# Patient Record
Sex: Male | Born: 1965 | Race: Black or African American | Hispanic: No | Marital: Married | Smoking: Former smoker
Health system: Southern US, Community
[De-identification: ages and names within clinical notes are randomized; demographics above are authoritative.]

## PROBLEM LIST (undated history)

## (undated) DIAGNOSIS — I509 Heart failure, unspecified: Secondary | ICD-10-CM

## (undated) DIAGNOSIS — K219 Gastro-esophageal reflux disease without esophagitis: Secondary | ICD-10-CM

## (undated) DIAGNOSIS — E039 Hypothyroidism, unspecified: Secondary | ICD-10-CM

## (undated) DIAGNOSIS — I5020 Unspecified systolic (congestive) heart failure: Secondary | ICD-10-CM

## (undated) DIAGNOSIS — M109 Gout, unspecified: Secondary | ICD-10-CM

## (undated) DIAGNOSIS — C801 Malignant (primary) neoplasm, unspecified: Secondary | ICD-10-CM

## (undated) DIAGNOSIS — G902 Horner's syndrome: Secondary | ICD-10-CM

## (undated) DIAGNOSIS — D563 Thalassemia minor: Secondary | ICD-10-CM

## (undated) HISTORY — DX: Unspecified systolic (congestive) heart failure: I50.20

## (undated) HISTORY — PX: EYE SURGERY: SHX253

## (undated) HISTORY — PX: THYROIDECTOMY: SHX17

## (undated) HISTORY — DX: Gout, unspecified: M10.9

## (undated) HISTORY — DX: Thalassemia minor: D56.3

---

## 2021-08-10 ENCOUNTER — Encounter (HOSPITAL_BASED_OUTPATIENT_CLINIC_OR_DEPARTMENT_OTHER): Payer: Self-pay

## 2021-08-10 DIAGNOSIS — G4733 Obstructive sleep apnea (adult) (pediatric): Secondary | ICD-10-CM

## 2021-08-30 ENCOUNTER — Ambulatory Visit: Payer: No Typology Code available for payment source | Attending: Neurology | Admitting: Neurology

## 2021-08-30 ENCOUNTER — Other Ambulatory Visit: Payer: Self-pay

## 2021-08-30 DIAGNOSIS — G4733 Obstructive sleep apnea (adult) (pediatric): Secondary | ICD-10-CM | POA: Insufficient documentation

## 2021-08-30 DIAGNOSIS — G4730 Sleep apnea, unspecified: Secondary | ICD-10-CM | POA: Diagnosis present

## 2021-09-11 NOTE — Procedures (Signed)
  Kinder A. Merlene Laughter, MD     [email or web address]             NOCTURNAL POLYSOMNOGRAPHY   LOCATION: [facility]  Patient Name: Elijah Perez, Elijah Perez Date: 08/30/2021 Gender: Male D.O.B: 01-23-1966 Age (years): 67 Referring Provider: Charlyne Petrin MD Height (inches): 71 Interpreting Physician: Phillips Odor MD, ABSM Weight (lbs): 237 RPSGT: Peak, Robert BMI: 33 MRN: [id number] Neck Size: 18.00 CLINICAL INFORMATION Sleep Study Type: NPSG     Indication for sleep study: N/A     Epworth Sleepiness Score: N/A     SLEEP STUDY TECHNIQUE As per the AASM Manual for the Scoring of Sleep and Associated Events v2.3 (April 2016) with a hypopnea requiring 4% desaturations.  The channels recorded and monitored were frontal, central and occipital EEG, electrooculogram (EOG), submentalis EMG (chin), nasal and oral airflow, thoracic and abdominal wall motion, anterior tibialis EMG, snore microphone, electrocardiogram, and pulse oximetry.  MEDICATIONS Medications self-administered by patient taken the night of the study : N/A No current outpatient medications on file.      SLEEP ARCHITECTURE The study was initiated at 10:37:35 PM and ended at 5:16:25 AM.  Sleep onset time was 5.2 minutes and the sleep efficiency was 92.4%. The total sleep time was 368.5 minutes.  Stage REM latency was 3.5 minutes.  The patient spent 3.80% of the night in stage N1 sleep, 85.75% in stage N2 sleep, 0.00% in stage N3 and 10.5% in REM.  Alpha intrusion was absent.  Supine sleep was 0.00%.  RESPIRATORY PARAMETERS The overall apnea/hypopnea index (AHI) was 5.2 per hour. There were 3 total apneas, including 0 obstructive, 3 central and 0 mixed apneas. There were 29 hypopneas and 15 RERAs.  The AHI during Stage REM sleep was 15.6 per hour.  AHI while supine was N/A per hour.  The mean oxygen saturation was 95.69%. The minimum SpO2 during sleep was 86.00%.  moderate snoring was  noted during this study.  CARDIAC DATA The 2 lead EKG demonstrated sinus rhythm. The mean heart rate was 65.99 beats per minute. Other EKG findings include: None.  LEG MOVEMENT DATA The total PLMS were 0 with a resulting PLMS index of 0.00. Associated arousal with leg movement index was 0.0.  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 The severity does not require positive pressure treatment. Absent slow-wave sleep is also noted.     Delano Metz, MD Diplomate, American Board of Sleep Medicine.  ELECTRONICALLY SIGNED ON:  09/11/2021, 4:02 PM [place] PH: [phone]   FX: [phone] [place]

## 2022-03-14 ENCOUNTER — Emergency Department (HOSPITAL_COMMUNITY): Payer: No Typology Code available for payment source

## 2022-03-14 ENCOUNTER — Encounter (HOSPITAL_COMMUNITY): Payer: Self-pay | Admitting: *Deleted

## 2022-03-14 ENCOUNTER — Emergency Department (HOSPITAL_COMMUNITY)
Admission: EM | Admit: 2022-03-14 | Discharge: 2022-03-14 | Disposition: A | Discharge status: Home / Self Care | Payer: No Typology Code available for payment source | Attending: Emergency Medicine | Admitting: Emergency Medicine

## 2022-03-14 ENCOUNTER — Other Ambulatory Visit: Payer: Self-pay

## 2022-03-14 DIAGNOSIS — M545 Low back pain, unspecified: Secondary | ICD-10-CM | POA: Insufficient documentation

## 2022-03-14 HISTORY — DX: Malignant (primary) neoplasm, unspecified: C80.1

## 2022-03-14 MED ORDER — KETOROLAC TROMETHAMINE 30 MG/ML IJ SOLN
15.0000 mg | Freq: Once | INTRAMUSCULAR | Status: AC
  Administered 2022-03-14: 15 mg via INTRAMUSCULAR
  Filled 2022-03-14: qty 1

## 2022-03-14 MED ORDER — DEXAMETHASONE SODIUM PHOSPHATE 10 MG/ML IJ SOLN
10.0000 mg | Freq: Once | INTRAMUSCULAR | Status: AC
  Administered 2022-03-14: 10 mg via INTRAMUSCULAR
  Filled 2022-03-14: qty 1

## 2022-03-14 MED ORDER — NAPROXEN 500 MG PO TABS: 500.0000 mg | ORAL_TABLET | Freq: Two times a day (BID) | ORAL | 0 refills | Status: DC

## 2022-03-14 NOTE — Discharge Instructions (Addendum)
You were seen in the emergency department today for low back pain.  You likely strained your low back.  We gave you some steroids here in the emergency department which should reduce some of the swelling in your low back.  Additionally we gave you some pain medication.  I will prescribe you naproxen and you continue to use lidocaine cream over your low back.  Heating pads may help to loosen the muscles of your lower back and encourage you to do some gentle stretching after this.  Please return to the emergency department if you have weakness of your legs, inability to control your bowel or bladder, numbness in your groin or back pain with fevers.  Otherwise please follow-up with your primary care provider.  I have also provided you with the number to Dr. Ruthe Mannan office who is in orthopedic surgeon if you continue to have ongoing symptoms. ?

## 2022-03-14 NOTE — ED Triage Notes (Signed)
Pt in c/o mid lower back pain onset x 2 days, denies injury, reports radiation to L leg, pt ambulatory, no hx of back injury or sx, A&O x4 ?

## 2022-03-14 NOTE — ED Provider Notes (Signed)
?[facility] ?Provider Note ? ? ?CSN: [id number] ?Arrival date & time: 03/14/22  1339 ? ?  ? ?History ? ?Chief Complaint  ?Patient presents with  ? Back Pain  ? ? ?Elijah Perez. is a 56 y.o. male.  With no pertinent past medical history presents emergency department with back pain. ? ?States that Monday morning patient woke up with low back pain.  Describes it as sharp pain in his low back that is intermittently causing numbness to the leg.  He states that he tried lidocaine cream and ibuprofen without relief of symptoms.  Also tried a heating pad without relief of symptoms.  States that pain has been consistent since Monday and not improving. Denies falls or trauma to the back. Denies IVDU, fever, saddle anesthesia, incontinence/retention of bowel or bladder, weakness of his extremities.  ? ? ?HPI ? ?  ? ?Home Medications ?Prior to Admission medications   ?Not on File  ?   ? ?Allergies    ?Shellfish allergy   ? ?Review of Systems   ?Review of Systems  ?Musculoskeletal:  Positive for back pain.  ?Neurological:  Positive for numbness.  ?All other systems reviewed and are negative. ? ?Physical Exam ?Updated Vital Signs ?BP (!) 145/103 (BP Location: Right Arm)   Pulse 81   Temp 97.8 ?F (36.6 ?C) (Oral)   Resp 16   Ht '5\' 11"'$  (1.803 m)   SpO2 100%  ?Physical Exam ?Vitals and nursing note reviewed.  ?Constitutional:   ?   General: He is not in acute distress. ?   Appearance: Normal appearance. He is normal weight. He is not ill-appearing or toxic-appearing.  ?HENT:  ?   Head: Normocephalic and atraumatic.  ?Eyes:  ?   Extraocular Movements: Extraocular movements intact.  ?Cardiovascular:  ?   Pulses: Normal pulses.  ?Pulmonary:  ?   Effort: No respiratory distress.  ?Abdominal:  ?   Palpations: Abdomen is soft.  ?Musculoskeletal:     ?   General: Normal range of motion.  ?   Cervical back: Normal range of motion and neck supple. No rigidity or tenderness.  ?   Thoracic back: Normal.  ?    Lumbar back: Tenderness and bony tenderness present. No swelling, edema or deformity. Negative right straight leg raise test and negative left straight leg raise test.  ?   Right lower leg: No edema.  ?   Left lower leg: No edema.  ?   Comments: Pain to the mid lumbar region with straight leg raise bilaterally. There is no radiation of pain down the legs or buttock.  ?No step offs or deformities on examination of the lumbar spine   ?Skin: ?   General: Skin is warm and dry.  ?   Capillary Refill: Capillary refill takes less than 2 seconds.  ?   Findings: No erythema.  ?Neurological:  ?   General: No focal deficit present.  ?   Mental Status: He is alert and oriented to person, place, and time. Mental status is at baseline.  ?   Sensory: No sensory deficit.  ?   Motor: No weakness.  ?Psychiatric:     ?   Mood and Affect: Mood normal.     ?   Behavior: Behavior normal.     ?   Thought Content: Thought content normal.     ?   Judgment: Judgment normal.  ? ? ?ED Results / Procedures / Treatments   ?Labs ?(all labs ordered  are listed, but only abnormal results are displayed) ?Labs Reviewed - No data to display ? ?EKG ?None ? ?Radiology ?DG Lumbar Spine Complete ? ?Result Date: 03/14/2022 ?CLINICAL DATA:  Low pain EXAM: LUMBAR SPINE - COMPLETE 4+ VIEW COMPARISON:  None. FINDINGS: There is no evidence of lumbar spine fracture. Alignment is normal. Intervertebral disc spaces are maintained. Nonobstructive pattern of bowel gas. IMPRESSION: No fracture or dislocation of the lumbar spine. Disc spaces and vertebral body heights are preserved. Lumbar disc and neural foraminal pathology may be further evaluated by MRI if indicated by neurologically localizing signs and symptoms. Electronically Signed   By: Delanna Ahmadi M.D.   On: 03/14/2022 14:25   ? ?Procedures ?Procedures  ? ? ?Medications Ordered in ED ?Medications  ?dexamethasone (DECADRON) injection 10 mg (has no administration in time range)  ?ketorolac (TORADOL) 30 MG/ML  injection 15 mg (has no administration in time range)  ? ? ?ED Course/ Medical Decision Making/ A&P ?  ?                        ?Medical Decision Making ?Amount and/or Complexity of Data Reviewed ?Radiology: ordered. ? ?Risk ?Prescription drug management. ? ?This patient presents to the ED for concern of low back pain, this involves an extensive number of treatment options, and is a complaint that carries with it a high risk of complications and morbidity.   ? ?Co morbidities that complicate the patient evaluation ?None ? ?Additional history obtained:  ?Additional history obtained from: None ?External records from outside source obtained and reviewed including: None ? ?Imaging Studies ordered:  ?I ordered imaging studies which included x-ray.  I independently reviewed & interpreted imaging & am in agreement with radiology impression. ?Imaging shows: ?Negative lumbar spine ? ?Medications  ?I ordered medication including Decadron, Toradol for pain ?Reevaluation of the patient after medication shows that patient stayed the same ? ?Tests Considered: ?MRI spine ? ?ED Course: ?56 year old male who presents emergency department with back pain most consistent with musculoskeletal strain. ? ?Less likely sciatica straight leg test was negative bilaterally.  He has no back pain red flags on his history or physical exam.  His presentation is not consistent with a malignancy, x-ray without evidence of fracture.  There are no cauda equina symptoms such as bowel or bladder incontinence or retention, saddle anesthesia, distal weakness.  He has no abdominal pain concerning for perforation ? ?No infectious symptoms, IV drug use, fever concerning for osteomyelitis or epidural abscess.  He does not have any CVA tenderness or urinary symptoms. ? ?Obtain x-ray imaging although low yield which is negative.  He was given Decadron and Toradol for pain relief.  We will prescribe him naproxen.  He is already using lidocaine cream which I  think is appropriate as well as heating pads.  I have him following up with primary care and information for orthopedics should his pain stay the same.  Additionally he is given return precautions for cauda equina symptoms. ? ?After consideration of the diagnostic results and the patients response to treatment, I feel that the patent would benefit from discharge. ?The patient has been appropriately medically screened and/or stabilized in the ED. I have low suspicion for any other emergent medical condition which would require further screening, evaluation or treatment in the ED or require inpatient management. The patient is overall well appearing and non-toxic in appearance. They are hemodynamically stable at time of discharge.   ?Final Clinical Impression(s) / ED Diagnoses ?  Final diagnoses:  ?Acute midline low back pain without sciatica  ? ? ?Rx / DC Orders ?ED Discharge Orders   ? ?      Ordered  ?  naproxen (NAPROSYN) 500 MG tablet  2 times daily       ? 03/14/22 1441  ? ?  ?  ? ?  ? ? ?  ?Mickie Hillier, PA-C ?03/14/22 1441 ? ?  ?Milton Ferguson, MD ?03/17/22 1005 ? ?

## 2022-04-24 ENCOUNTER — Encounter (HOSPITAL_COMMUNITY): Payer: Self-pay | Admitting: Emergency Medicine

## 2022-04-24 ENCOUNTER — Emergency Department (HOSPITAL_COMMUNITY)
Admission: EM | Admit: 2022-04-24 | Discharge: 2022-04-24 | Disposition: A | Discharge status: Home / Self Care | Payer: No Typology Code available for payment source | Attending: Emergency Medicine | Admitting: Emergency Medicine

## 2022-04-24 ENCOUNTER — Other Ambulatory Visit: Payer: Self-pay

## 2022-04-24 ENCOUNTER — Emergency Department (HOSPITAL_COMMUNITY): Payer: No Typology Code available for payment source

## 2022-04-24 DIAGNOSIS — R052 Subacute cough: Secondary | ICD-10-CM | POA: Diagnosis not present

## 2022-04-24 DIAGNOSIS — Z20822 Contact with and (suspected) exposure to covid-19: Secondary | ICD-10-CM | POA: Insufficient documentation

## 2022-04-24 DIAGNOSIS — R059 Cough, unspecified: Secondary | ICD-10-CM | POA: Diagnosis present

## 2022-04-24 DIAGNOSIS — Z85850 Personal history of malignant neoplasm of thyroid: Secondary | ICD-10-CM | POA: Insufficient documentation

## 2022-04-24 DIAGNOSIS — R051 Acute cough: Secondary | ICD-10-CM

## 2022-04-24 HISTORY — DX: Horner's syndrome: G90.2

## 2022-04-24 LAB — RESP PANEL BY RT-PCR (FLU A&B, COVID) ARPGX2
Influenza A by PCR: NEGATIVE
Influenza B by PCR: NEGATIVE
SARS Coronavirus 2 by RT PCR: NEGATIVE

## 2022-04-24 MED ORDER — FAMOTIDINE 20 MG PO TABS: 20.0000 mg | ORAL_TABLET | Freq: Two times a day (BID) | ORAL | 0 refills | Status: DC

## 2022-04-24 MED ORDER — BENZONATATE 100 MG PO CAPS
100.0000 mg | ORAL_CAPSULE | Freq: Once | ORAL | Status: DC
  Filled 2022-04-24: qty 1

## 2022-04-24 MED ORDER — BENZONATATE 100 MG PO CAPS: 100.0000 mg | ORAL_CAPSULE | Freq: Three times a day (TID) | ORAL | 0 refills | Status: DC

## 2022-04-24 NOTE — ED Provider Notes (Signed)
?[facility] ?Provider Note ? ? ?CSN: [id number] ?Arrival date & time: 04/24/22  1416 ? ?  ? ?History ? ?Chief Complaint  ?Patient presents with  ? Cough  ? ? ?Elijah Perez. is a 56 y.o. male. ? ? ?Cough ? ?Patient presents with cough x2 and half weeks.  Intermittent throughout the day, he notices it tends to be worse right before he goes to bed.  Nonproductive, not associated with fever nasal congestion.  Denies any chest pain or shortness of breath, no dysphagia or upper respiratory symptoms.  Denies any abdominal pain or burning pain.  He states he was previously on Prilosec for GERD, he has been weaned off that for some time.  His last meal the day is at 7 PM, goes to bed around 10 PM.  Denies any obvious relationship with spicy food or after eating but does notice more coughing when he lays down flat.  He has not woken up in the middle of the night by coughing. ? ?Patient does have a history of thyroid cancer, he is status post thyroidectomy. ? ?Home Medications ?Prior to Admission medications   ?Medication Sig Start Date End Date Taking? Authorizing Provider  ?naproxen (NAPROSYN) 500 MG tablet Take 1 tablet (500 mg total) by mouth 2 (two) times daily. 03/14/22   Mickie Hillier, PA-C  ?   ? ?Allergies    ?Shellfish allergy   ? ?Review of Systems   ?Review of Systems  ?Respiratory:  Positive for cough.   ? ?Physical Exam ?Updated Vital Signs ?BP (!) 145/107 (BP Location: Right Arm)   Pulse 81   Temp 97.9 ?F (36.6 ?C) (Oral)   Resp 20   Ht '5\' 11"'$  (1.803 m)   Wt 106.6 kg   SpO2 99%   BMI 32.78 kg/m?  ?Physical Exam ?Vitals and nursing note reviewed. Exam conducted with a chaperone present.  ?Constitutional:   ?   Appearance: Normal appearance.  ?HENT:  ?   Head: Normocephalic and atraumatic.  ?Eyes:  ?   General: No scleral icterus.    ?   Right eye: No discharge.     ?   Left eye: No discharge.  ?   Extraocular Movements: Extraocular movements intact.  ?   Pupils: Pupils are equal,  round, and reactive to light.  ?Neck:  ?   Comments: Thyroidectomy scar ?Cardiovascular:  ?   Rate and Rhythm: Normal rate and regular rhythm.  ?   Pulses: Normal pulses.  ?   Heart sounds: Normal heart sounds. No murmur heard. ?  No friction rub. No gallop.  ?Pulmonary:  ?   Effort: Pulmonary effort is normal. No respiratory distress.  ?   Breath sounds: Normal breath sounds.  ?Abdominal:  ?   General: Abdomen is flat. Bowel sounds are normal. There is no distension.  ?   Palpations: Abdomen is soft.  ?   Tenderness: There is no abdominal tenderness.  ?Skin: ?   General: Skin is warm and dry.  ?   Coloration: Skin is not jaundiced.  ?Neurological:  ?   Mental Status: He is alert. Mental status is at baseline.  ?   Coordination: Coordination normal.  ? ? ?ED Results / Procedures / Treatments   ?Labs ?(all labs ordered are listed, but only abnormal results are displayed) ?Labs Reviewed  ?RESP PANEL BY RT-PCR (FLU A&B, COVID) [id number]  ? ? ?EKG ?None ? ?Radiology ?DG Chest 2 View ? ?Result Date: 04/24/2022 ?CLINICAL  DATA:  Provided history: Cough. Additional history provided: Cough, chest discomfort. EXAM: CHEST - 2 VIEW COMPARISON:  No pertinent prior exams available for comparison. FINDINGS: Heart size within normal limits. No appreciable airspace consolidation. No evidence of pleural effusion or pneumothorax. No acute bony abnormality identified. IMPRESSION: No evidence of active cardiopulmonary disease. Electronically Signed   By: Kellie Simmering D.O.   On: 04/24/2022 15:17   ? ?Procedures ?Procedures  ? ? ?Medications Ordered in ED ?Medications  ?benzonatate (TESSALON) capsule 100 mg (has no administration in time range)  ? ? ?ED Course/ Medical Decision Making/ A&P ?  ?                        ?Medical Decision Making ?Amount and/or Complexity of Data Reviewed ?Radiology: ordered. ? ? ?Patient presents with subacute cough.  Vitals are stable, he is not hypoxic.  Lungs are clear to auscultation bilaterally.  Patient is  COVID and flu negative, I also ordered, viewed and interpreted x-ray and agree with radiologist interpretation of no acute process. ? ?Considered pneumonia but he is not febrile, also no evidence of it on chest x-ray.  No underlying nodules noted on the chest x-ray, patient courage to continue following with his PCP.  We will start patient on Tessalon Perles for cough and have him restart the Pepcid.  We will have him follow-up with his PCP.  Symptoms seem more consistent with GERD.  No acute or emergent process requiring additional work-up at this time.  Discussed with patient who is in agreement with plan and able to follow-up outpatient. ? ? ? ? ? ? ? ?Final Clinical Impression(s) / ED Diagnoses ?Final diagnoses:  ?None  ? ? ?Rx / DC Orders ?ED Discharge Orders   ? ? None  ? ?  ? ? ?  ?Sherrill Raring, PA-C ?04/24/22 1629 ? ?  ?Margette Fast, MD ?04/25/22 2340 ? ?

## 2022-04-24 NOTE — Discharge Instructions (Addendum)
Urine negative for COVID and.  Chest x-ray is clear without signs of pneumonia.  Follow-up with your primary care doctor in the next week for reevaluation make sure things are improving.  Start taking Pepcid for the next few weeks to see if that helps with your symptoms.  Try to eat your last meal of the day at least 4 hours before you go to bed.  Avoid spicy foods.  You also try the Tessalon Perles every 8 hours as needed for cough and see if that helps. ?

## 2022-04-24 NOTE — ED Notes (Signed)
Pt left w/o getting discharge papers or meds. ?

## 2022-04-24 NOTE — ED Triage Notes (Signed)
Pt has been persistent cough with congestion x 3 weeks. ?

## 2023-01-01 ENCOUNTER — Encounter (HOSPITAL_COMMUNITY): Payer: Self-pay | Admitting: Emergency Medicine

## 2023-01-01 ENCOUNTER — Other Ambulatory Visit: Payer: Self-pay

## 2023-01-01 ENCOUNTER — Inpatient Hospital Stay (HOSPITAL_COMMUNITY): Payer: No Typology Code available for payment source

## 2023-01-01 ENCOUNTER — Emergency Department (HOSPITAL_COMMUNITY): Payer: No Typology Code available for payment source

## 2023-01-01 ENCOUNTER — Inpatient Hospital Stay (HOSPITAL_COMMUNITY)
Admission: EM | Admit: 2023-01-01 | Discharge: 2023-01-04 | DRG: 286 | Disposition: A | Discharge status: Home / Self Care | Payer: No Typology Code available for payment source | Attending: Cardiology | Admitting: Cardiology

## 2023-01-01 DIAGNOSIS — E890 Postprocedural hypothyroidism: Secondary | ICD-10-CM | POA: Diagnosis present

## 2023-01-01 DIAGNOSIS — Z6832 Body mass index (BMI) 32.0-32.9, adult: Secondary | ICD-10-CM

## 2023-01-01 DIAGNOSIS — I428 Other cardiomyopathies: Secondary | ICD-10-CM

## 2023-01-01 DIAGNOSIS — J9601 Acute respiratory failure with hypoxia: Secondary | ICD-10-CM | POA: Diagnosis present

## 2023-01-01 DIAGNOSIS — I5021 Acute systolic (congestive) heart failure: Secondary | ICD-10-CM

## 2023-01-01 DIAGNOSIS — E669 Obesity, unspecified: Secondary | ICD-10-CM | POA: Insufficient documentation

## 2023-01-01 DIAGNOSIS — Z91013 Allergy to seafood: Secondary | ICD-10-CM | POA: Diagnosis not present

## 2023-01-01 DIAGNOSIS — Z87891 Personal history of nicotine dependence: Secondary | ICD-10-CM

## 2023-01-01 DIAGNOSIS — I509 Heart failure, unspecified: Secondary | ICD-10-CM | POA: Diagnosis not present

## 2023-01-01 DIAGNOSIS — Z1152 Encounter for screening for COVID-19: Secondary | ICD-10-CM | POA: Diagnosis not present

## 2023-01-01 DIAGNOSIS — Z85850 Personal history of malignant neoplasm of thyroid: Secondary | ICD-10-CM | POA: Diagnosis not present

## 2023-01-01 DIAGNOSIS — I2489 Other forms of acute ischemic heart disease: Secondary | ICD-10-CM | POA: Insufficient documentation

## 2023-01-01 DIAGNOSIS — J810 Acute pulmonary edema: Secondary | ICD-10-CM | POA: Diagnosis not present

## 2023-01-01 DIAGNOSIS — G902 Horner's syndrome: Secondary | ICD-10-CM | POA: Diagnosis present

## 2023-01-01 DIAGNOSIS — R739 Hyperglycemia, unspecified: Secondary | ICD-10-CM | POA: Diagnosis not present

## 2023-01-01 DIAGNOSIS — E039 Hypothyroidism, unspecified: Secondary | ICD-10-CM | POA: Insufficient documentation

## 2023-01-01 DIAGNOSIS — Z79899 Other long term (current) drug therapy: Secondary | ICD-10-CM | POA: Diagnosis not present

## 2023-01-01 DIAGNOSIS — R7989 Other specified abnormal findings of blood chemistry: Secondary | ICD-10-CM

## 2023-01-01 DIAGNOSIS — N179 Acute kidney failure, unspecified: Secondary | ICD-10-CM | POA: Insufficient documentation

## 2023-01-01 DIAGNOSIS — R7303 Prediabetes: Secondary | ICD-10-CM | POA: Diagnosis present

## 2023-01-01 DIAGNOSIS — K219 Gastro-esophageal reflux disease without esophagitis: Secondary | ICD-10-CM | POA: Insufficient documentation

## 2023-01-01 LAB — RESP PANEL BY RT-PCR (RSV, FLU A&B, COVID)  RVPGX2
Influenza A by PCR: NEGATIVE
Influenza B by PCR: NEGATIVE
Resp Syncytial Virus by PCR: NEGATIVE
SARS Coronavirus 2 by RT PCR: NEGATIVE

## 2023-01-01 LAB — BASIC METABOLIC PANEL
Anion gap: 7 (ref 5–15)
BUN: 13 mg/dL (ref 6–20)
CO2: 24 mmol/L (ref 22–32)
Calcium: 8.9 mg/dL (ref 8.9–10.3)
Chloride: 105 mmol/L (ref 98–111)
Creatinine, Ser: 1.18 mg/dL (ref 0.61–1.24)
GFR, Estimated: >60 mL/min (ref >60)
Glucose, Bld: 112 mg/dL — ABNORMAL HIGH (ref 70–99)
Potassium: 4 mmol/L (ref 3.5–5.1)
Sodium: 136 mmol/L (ref 135–145)

## 2023-01-01 LAB — ECHOCARDIOGRAM COMPLETE
AR max vel: 2.81 cm2
AV Area VTI: 2.51 cm2
AV Area mean vel: 2.68 cm2
AV Mean grad: 3 mmHg
AV Peak grad: 4.6 mmHg
Ao pk vel: 1.07 m/s
Area-P 1/2: 4.52 cm2
Height: 71 in
MV VTI: 2.74 cm2
S' Lateral: 4.9 cm
Weight: 3760 oz

## 2023-01-01 LAB — CBC
HCT: 42.2 % (ref 39.0–52.0)
Hemoglobin: 13.1 g/dL (ref 13.0–17.0)
MCH: 23.3 pg — ABNORMAL LOW (ref 26.0–34.0)
MCHC: 31 g/dL (ref 30.0–36.0)
MCV: 75 fL — ABNORMAL LOW (ref 80.0–100.0)
Platelets: 164 10*3/uL (ref 150–400)
RBC: 5.63 MIL/uL (ref 4.22–5.81)
RDW: 14.7 % (ref 11.5–15.5)
WBC: 5.4 10*3/uL (ref 4.0–10.5)
nRBC: 0 % (ref 0.0–0.2)

## 2023-01-01 LAB — D-DIMER, QUANTITATIVE: D-Dimer, Quant: 0.68 ug/mL-FEU — ABNORMAL HIGH (ref 0.00–0.50)

## 2023-01-01 LAB — PHOSPHORUS: Phosphorus: 4 mg/dL (ref 2.5–4.6)

## 2023-01-01 LAB — TROPONIN I (HIGH SENSITIVITY)
Troponin I (High Sensitivity): 35 ng/L — ABNORMAL HIGH (ref <18)
Troponin I (High Sensitivity): 34 ng/L — ABNORMAL HIGH (ref <18)

## 2023-01-01 LAB — BRAIN NATRIURETIC PEPTIDE: B Natriuretic Peptide: 764 pg/mL — ABNORMAL HIGH (ref 0.0–100.0)

## 2023-01-01 LAB — MAGNESIUM: Magnesium: 1.8 mg/dL (ref 1.7–2.4)

## 2023-01-01 LAB — HIV ANTIBODY (ROUTINE TESTING W REFLEX): HIV Screen 4th Generation wRfx: NONREACTIVE

## 2023-01-01 MED ORDER — FUROSEMIDE 10 MG/ML IJ SOLN
80.0000 mg | Freq: Once | INTRAMUSCULAR | Status: AC
  Administered 2023-01-01: 80 mg via INTRAVENOUS
  Filled 2023-01-01: qty 8

## 2023-01-01 MED ORDER — PERFLUTREN LIPID MICROSPHERE
1.0000 mL | INTRAVENOUS | Status: AC | PRN
  Administered 2023-01-01: 8 mL via INTRAVENOUS

## 2023-01-01 MED ORDER — IOHEXOL 350 MG/ML SOLN
100.0000 mL | Freq: Once | INTRAVENOUS | Status: AC | PRN
  Administered 2023-01-01: 100 mL via INTRAVENOUS

## 2023-01-01 MED ORDER — ALBUTEROL SULFATE HFA 108 (90 BASE) MCG/ACT IN AERS: 2.0000 | INHALATION_SPRAY | RESPIRATORY_TRACT | Status: DC | PRN

## 2023-01-01 MED ORDER — FAMOTIDINE 20 MG PO TABS
20.0000 mg | ORAL_TABLET | Freq: Two times a day (BID) | ORAL | Status: DC
  Administered 2023-01-01 – 2023-01-03 (×5): 20 mg via ORAL
  Filled 2023-01-01 (×5): qty 1

## 2023-01-01 MED ORDER — ENOXAPARIN SODIUM 60 MG/0.6ML IJ SOSY
50.0000 mg | PREFILLED_SYRINGE | Freq: Every day | INTRAMUSCULAR | Status: DC
  Administered 2023-01-01 – 2023-01-04 (×3): 50 mg via SUBCUTANEOUS
  Filled 2023-01-01 (×4): qty 0.6

## 2023-01-01 MED ORDER — ONDANSETRON HCL 4 MG/2ML IJ SOLN: 4.0000 mg | Freq: Four times a day (QID) | INTRAMUSCULAR | Status: DC | PRN

## 2023-01-01 MED ORDER — FUROSEMIDE 10 MG/ML IJ SOLN
40.0000 mg | Freq: Two times a day (BID) | INTRAMUSCULAR | Status: DC
  Administered 2023-01-01 – 2023-01-02 (×3): 40 mg via INTRAVENOUS
  Filled 2023-01-01 (×4): qty 4

## 2023-01-01 MED ORDER — ACETAMINOPHEN 325 MG PO TABS
650.0000 mg | ORAL_TABLET | Freq: Four times a day (QID) | ORAL | Status: DC | PRN
  Administered 2023-01-03: 650 mg via ORAL

## 2023-01-01 MED ORDER — ONDANSETRON HCL 4 MG PO TABS: 4.0000 mg | ORAL_TABLET | Freq: Four times a day (QID) | ORAL | Status: DC | PRN

## 2023-01-01 MED ORDER — ACETAMINOPHEN 650 MG RE SUPP: 650.0000 mg | Freq: Four times a day (QID) | RECTAL | Status: DC | PRN

## 2023-01-01 NOTE — ED Triage Notes (Signed)
Pt c/o SOB that started around 2100. Pt endorses a dry cough with audible wheezes.  Denies, fever, n/v/d.

## 2023-01-01 NOTE — Progress Notes (Addendum)
Brief progress note:  Please see full H&P dictated by my colleague earlier this morning for complete details.  Elijah Perez. is a 57 year old male with past medical history significant for thyroid carcinoma s/p thyroidectomy 2017, GERD, gout who presented to [facility], ED on 1/16 with shortness of breath.  Initial workup notable for afebrile without leukocytosis, elevated BNP, elevated D-dimer and CT angiogram chest negative for pulmonary embolism but does note pulmonary edema and small pleural effusion.  COVID-19, influenza A/B, RSV PCR negative.  EDP consulted [facility] for admission for further evaluation and management of suspected new onset CHF exacerbation.  Subjective: Patient seen examined bedside, resting comfortably.  Remains in ED holding area.  Reports dyspnea slightly improved.  Patient states his shortness of breath is worse when he is laying flat.  No other questions or concerns at this time.  Denies headache, no dizziness, no chest pain, no palpitations, no fever/chills/night sweats, no nausea/vomiting/diarrhea, no focal weakness, no fatigue, no paresthesias.  No acute events overnight per nursing staff.  Physical Exam GEN: 57 yo male in NAD, alert and oriented x 3, obese HEENT: NCAT, PERRL, EOMI, sclera clear, MMM PULM: Breath sounds slight diminished bilateral bases with crackles, normal respiratory effort without accessory muscle use, on 2 L nasal cannula with SpO2 97% at rest CV: RRR w/o M/G/R GI: abd soft, NTND, NABS, no R/G/M MSK: no peripheral edema, muscle strength globally intact 5/5 bilateral upper/lower extremities NEURO: CN II-XII intact, no focal deficits, sensation to light touch intact PSYCH: normal mood/affect Integumentary: dry/intact, no rashes or wounds  Assessment/plan:  Acute systolic CHF exacerbation, new onset Patient presenting to ED with progressive shortness of breath.  Was noted to have an elevated BNP.  CT angiogram chest negative for pulmonary  embolism but with pulmonary edema and small pleural effusion.  TTE with LVEF 30-35%, LV moderately decreased function, LV demonstrates global hypokinesis, LV mildly dilated, biatrial enlargement, IVC normal in size. -- Cardiology consulted -- Furosemide 40 mg IV every 12 hours --Strict I's and O's and daily weights  Elevated troponin High sensitive troponin elevated at 35 followed by 34.  Etiology likely secondary to type II demand ischemia in the setting of volume overload from CHF exacerbation. --Continue monitor on telemetry  Elevated D-dimer D-dimer 0.68, CT angiogram chest negative for PE.  GERD: Continue Pepcid  Obesity Body mass index is 32.78 kg/m.  Discussed with patient needs for aggressive lifestyle changes/weight loss as this complicates all facets of care.  Outpatient follow-up with PCP.  Time spent: 51 minutes spent on chart review, discussion with nursing staff, consultants, updating family and interview/physical exam; more than 50% of that time was spent in counseling and/or coordination of care.  Elijah Mcnellis J [place] DO [facility] Available via Epic secure chat 7am-7pm After these hours, please refer to coverage provider listed on [email or web address]

## 2023-01-01 NOTE — ED Provider Notes (Signed)
[facility] EMERGENCY DEPARTMENT  Provider Note  CSN: [id number] Arrival date & time: 01/01/23 0034  History Chief Complaint  Patient presents with   Shortness of Breath    With cough    Elijah Perez. is a 57 y.o. male with history of thyroid cancer, s/p thyroidectomy in 2017 reports he has had some SOB when bending over recently, but tonight he became acutely SOB when lying down to go to sleep. Has had a dry cough and some chest soreness when coughing, but no fever, sputum or other chest pains. No leg swelling. No DOE. He denies any known CAD, CHF, COPD. Does not smoke. He is on synthroid.    Home Medications Prior to Admission medications   Medication Sig Start Date End Date Taking? Authorizing Provider  benzonatate (TESSALON) 100 MG capsule Take 1 capsule (100 mg total) by mouth every 8 (eight) hours. 04/24/22   Sherrill Raring, PA-C  famotidine (PEPCID) 20 MG tablet Take 1 tablet (20 mg total) by mouth 2 (two) times daily. 04/24/22   Sherrill Raring, PA-C  naproxen (NAPROSYN) 500 MG tablet Take 1 tablet (500 mg total) by mouth 2 (two) times daily. 03/14/22   Mickie Hillier, PA-C     Allergies    Shellfish allergy   Review of Systems   Review of Systems Please see HPI for pertinent positives and negatives  Physical Exam BP (!) 128/94   Pulse 83   Temp 98.1 F (36.7 C) (Oral)   Resp (!) 21   Ht '5\' 11"'$  (1.803 m)   Wt 106.6 kg   SpO2 97%   BMI 32.78 kg/m   Physical Exam Vitals and nursing note reviewed.  Constitutional:      Appearance: Normal appearance.  HENT:     Head: Normocephalic and atraumatic.     Nose: Nose normal.     Mouth/Throat:     Mouth: Mucous membranes are moist.  Eyes:     Extraocular Movements: Extraocular movements intact.     Conjunctiva/sclera: Conjunctivae normal.  Cardiovascular:     Rate and Rhythm: Normal rate.  Pulmonary:     Effort: Pulmonary effort is normal.     Breath sounds: Rales present.  Abdominal:     General: Abdomen is  flat.     Palpations: Abdomen is soft.     Tenderness: There is no abdominal tenderness.  Musculoskeletal:        General: No swelling. Normal range of motion.     Cervical back: Neck supple.     Right lower leg: No edema.     Left lower leg: No edema.  Skin:    General: Skin is warm and dry.  Neurological:     General: No focal deficit present.     Mental Status: He is alert.  Psychiatric:        Mood and Affect: Mood normal.     ED Results / Procedures / Treatments   EKG EKG Interpretation  Date/Time:  Tuesday January 01 2023 01:11:50 EST Ventricular Rate:  96 PR Interval:  192 QRS Duration: 112 QT Interval:  362 QTC Calculation: 457 R Axis:   17 Text Interpretation: Normal sinus rhythm Possible Anterior infarct , age undetermined Abnormal ECG No previous ECGs available Confirmed by Calvert Cantor [phone]) on 01/01/2023 1:44:15 AM  Procedures .Critical Care  Performed by: Truddie Hidden, MD Authorized by: Truddie Hidden, MD   Critical care provider statement:    Critical care time (minutes):  58   Critical care time was exclusive of:  Separately billable procedures and treating other patients   Critical care was necessary to treat or prevent imminent or life-threatening deterioration of the following conditions:  Cardiac failure and respiratory failure   Critical care was time spent personally by me on the following activities:  Development of treatment plan with patient or surrogate, discussions with consultants, evaluation of patient's response to treatment, examination of patient, ordering and review of laboratory studies, ordering and review of radiographic studies, ordering and performing treatments and interventions, pulse oximetry, re-evaluation of patient's condition and review of old charts   Care discussed with: admitting provider     Medications Ordered in the ED Medications  furosemide (LASIX) injection 80 mg (has no administration in time range)   iohexol (OMNIPAQUE) 350 MG/ML injection 100 mL (100 mLs Intravenous Contrast Given 01/01/23 0403)    Initial Impression and Plan  Patient here with SOB, noted to be borderline hypoxic on arrival, states improved with supplemental oxygen placed in triage while waiting ED exam room. Consider viral syndrome, pneumonia, CHF, or interstitial lung disease. He is not in distress at the time of my evaluation, has rales but no wheezing. Will check labs, CXR and place in exam room when available.   ED Course   Clinical Course as of 01/01/23 0521  Tue Jan 01, 2023  0145 I personally viewed the images from radiology studies and agree with radiologist interpretation: CXR shows likely pulm edema vs atypical infection. [CS]  3013 CBC is unremarkable. Covid/Flu/RSV swab is neg. Dimer is mildly elevated, will plan CTA when BMP is resulted.   [CS]  0327 BMP is unremarkable. BNP and Trop both mildly elevated. Will send for CTA, will likely need admission for further evaluation of pulm edema and diuresis if no PE.  [CS]  0456 Repeat trop is flat.  [CS]  [id number] I personally viewed the images from radiology studies and agree with radiologist interpretation: CTA is neg for PE, Lasix ordered. Spoke with Dr. Josephine Cables, Hospitalist, who will evaluate for admission.   Of note, patient's son is an ED physician in [place], [place] (Dr. Rafael Bihari, [phone]) currently out of the country on vacation, but I spoke to him to discuss the plan. He is going to take the next flight home.  [CS]    Clinical Course User Index [CS] Truddie Hidden, MD     MDM Rules/Calculators/A&P Medical Decision Making Problems Addressed: Acute respiratory failure with hypoxia [facility]): acute illness or injury New onset of congestive heart failure [facility]): acute illness or injury  Amount and/or Complexity of Data Reviewed Labs: ordered. Decision-making details documented in ED Course. Radiology: ordered and independent interpretation  performed. Decision-making details documented in ED Course. ECG/medicine tests: ordered and independent interpretation performed. Decision-making details documented in ED Course.  Risk Prescription drug management. Decision regarding hospitalization.    Final Clinical Impression(s) / ED Diagnoses Final diagnoses:  New onset of congestive heart failure ([place])  Acute respiratory failure with hypoxia [facility])    Rx / DC Orders ED Discharge Orders     None        Truddie Hidden, MD 01/01/23 [phone]

## 2023-01-01 NOTE — Progress Notes (Signed)
  Transition of Care [facility]) Screening Note   Patient Details  Name: Elijah Perez. Date of Birth: 02/24/1966   Transition of Care [facility]) CM/SW Contact:    Ihor Gully, LCSW Phone Number: 01/01/2023, 11:13 AM    Transition of Care Department [facility]) has reviewed patient and no TOC needs have been identified at this time. We will continue to monitor patient advancement through interdisciplinary progression rounds. If new patient transition needs arise, please place a TOC consult.   [place] Your notification ID is: [phone]

## 2023-01-01 NOTE — Progress Notes (Addendum)
Auscultated patient's BS, patient sounds clear and diminished at this time.  Patient currently on 2L [place] with sat of 97%.

## 2023-01-01 NOTE — Progress Notes (Signed)
*  PRELIMINARY RESULTS* Echocardiogram 2D Echocardiogram has been performed.  Elijah Perez 01/01/2023, 2:11 PM

## 2023-01-01 NOTE — H&P (Signed)
History and Physical    Patient: Elijah Perez. NLG:[id number] DOB: 1966-06-16 DOA: 01/01/2023 DOS: the patient was seen and examined on 01/01/2023 PCP: [facility]  Patient coming from: Home  Chief Complaint:  Chief Complaint  Patient presents with   Shortness of Breath    With cough   HPI: Keylen Eckenrode. is a 57 y.o. male with medical history significant of GERD, gout, prior cancer status post thyroidectomy (2017) who presents to the emergency department due to shortness of breath which started last night when he went to bed.  Patient states that he had shortness of breath when he laid flat in bed, this improved a little when he sat up.  He endorsed about 6-week onset of a nonproductive cough which was associated with some chest soreness from coughing.  He denies leg swelling, shortness of breath on exertion and denies any history of heart problems in the family.  ED Course:  In the emergency department, patient was intermittently tachypneic, BP was 133/99 and other vital signs were within normal range.  Workup in the ED showed normal CBC, MCV is 75, BMP was normal except for blood glucose of 112, troponin x 2 - 35 > 34, BNP 764, D-dimer 0.68.  Influenza A, B, SARS coronavirus 2, RSV was negative. Chest x-ray showed pulmonary edema.  Differential diagnosis includes viral infection CT angiography chest with contrast showed pulmonary edema and small pleural effusions.  Negative for pulmonary embolism Patient was treated with IV Lasix 80 mg x 1.  Hospitalist was asked to admit patient for further evaluation and management.  Review of Systems: Review of systems as noted in the HPI. All other systems reviewed and are negative.   Past Medical History:  Diagnosis Date   Cancer ([place])    Horner's syndrome    Past Surgical History:  Procedure Laterality Date   EYE SURGERY     THYROIDECTOMY Bilateral     Social History:  reports that he quit smoking about 28 years ago.  His smoking use included cigarettes. He has never used smokeless tobacco. He reports that he does not currently use alcohol after a past usage of about 2.0 standard drinks of alcohol per week. He reports that he does not currently use drugs.   Allergies  Allergen Reactions   Shellfish Allergy Itching    History reviewed. No pertinent family history.   Prior to Admission medications   Medication Sig Start Date End Date Taking? Authorizing Provider  benzonatate (TESSALON) 100 MG capsule Take 1 capsule (100 mg total) by mouth every 8 (eight) hours. 04/24/22   Sherrill Raring, PA-C  famotidine (PEPCID) 20 MG tablet Take 1 tablet (20 mg total) by mouth 2 (two) times daily. 04/24/22   Sherrill Raring, PA-C  naproxen (NAPROSYN) 500 MG tablet Take 1 tablet (500 mg total) by mouth 2 (two) times daily. 03/14/22   Mickie Hillier, PA-C    Physical Exam: BP (!) 128/94   Pulse 83   Temp 98.1 F (36.7 C) (Oral)   Resp (!) 21   Ht '5\' 11"'$  (1.803 m)   Wt 106.6 kg   SpO2 97%   BMI 32.78 kg/m   General: 58 y.o. year-old male well developed well nourished in no acute distress.  Alert and oriented x3. HEENT: NCAT, EOMI Neck: Supple, trachea medial Cardiovascular: Regular rate and rhythm with no rubs or gallops.  No thyromegaly or JVD noted.  No lower extremity edema. 2/4 pulses in all 4 extremities.  Respiratory: Bilateral Rales in lower lobes on auscultation.  No rhonchi.  Abdomen: Soft, nontender nondistended with normal bowel sounds x4 quadrants. Muskuloskeletal: No cyanosis, clubbing or edema noted bilaterally Neuro: CN II-XII intact, strength 5/5 x 4, sensation, reflexes intact Skin: No ulcerative lesions noted or rashes Psychiatry: Judgement and insight appear normal. Mood is appropriate for condition and setting          Labs on Admission:  Basic Metabolic Panel: Recent Labs  Lab 01/01/23 0227  NA 136  K 4.0  CL 105  CO2 24  GLUCOSE 112*  BUN 13  CREATININE 1.18  CALCIUM 8.9   Liver  Function Tests: No results for input(s): "AST", "ALT", "ALKPHOS", "BILITOT", "PROT", "ALBUMIN" in the last 168 hours. No results for input(s): "LIPASE", "AMYLASE" in the last 168 hours. No results for input(s): "AMMONIA" in the last 168 hours. CBC: Recent Labs  Lab 01/01/23 0227  WBC 5.4  HGB 13.1  HCT 42.2  MCV 75.0*  PLT 164   Cardiac Enzymes: No results for input(s): "CKTOTAL", "CKMB", "CKMBINDEX", "TROPONINI" in the last 168 hours.  BNP (last 3 results) Recent Labs    01/01/23 0227  BNP 764.0*    ProBNP (last 3 results) No results for input(s): "PROBNP" in the last 8760 hours.  CBG: No results for input(s): "GLUCAP" in the last 168 hours.  Radiological Exams on Admission: CT Angio Chest PE W/Cm &/Or Wo Cm  Result Date: 01/01/2023 CLINICAL DATA:  Pulmonary embolism suspected, positive D-dimer with low to intermediate probability. Dry cough with wheezing. EXAM: CT ANGIOGRAPHY CHEST WITH CONTRAST TECHNIQUE: Multidetector CT imaging of the chest was performed using the standard protocol during bolus administration of intravenous contrast. Multiplanar CT image reconstructions and MIPs were obtained to evaluate the vascular anatomy. RADIATION DOSE REDUCTION: This exam was performed according to the departmental dose-optimization program which includes automated exposure control, adjustment of the mA and/or kV according to patient size and/or use of iterative reconstruction technique. CONTRAST:  143m OMNIPAQUE IOHEXOL 350 MG/ML SOLN COMPARISON:  Radiograph from earlier today FINDINGS: Cardiovascular: Satisfactory opacification of the pulmonary arteries to the segmental level. No evidence of pulmonary embolism. Normal heart size. No pericardial effusion. Mediastinum/Nodes: Negative for adenopathy or mass Lungs/Pleura: Airway cuffing and interlobular septal thickening diffusely with airspace density at the bases, somewhat greater on the right but still bilateral and dependent. Small  bilateral pleural effusion. Upper Abdomen: No acute finding Musculoskeletal: No acute finding Review of the MIP images confirms the above findings. IMPRESSION: 1. Pulmonary edema and small pleural effusions. 2. Negative for pulmonary embolism. Electronically Signed   By: JJorje GuildM.D.   On: 01/01/2023 04:27   DG Chest 2 View  Result Date: 01/01/2023 CLINICAL DATA:  SOB. Pt c/o SOB that started around 2100. Pt endorses a dry cough with audible wheezes. Denies, fever, n/v/d. Denies any cardiac or pulmonary hx. EXAM: CHEST - 2 VIEW COMPARISON:  Chest x-ray 04/24/2022 FINDINGS: The heart and mediastinal contours are unchanged No focal consolidation. Interval development of increased interstitial markings. No pleural effusion. No pneumothorax. No acute osseous abnormality. IMPRESSION: Pulmonary edema.  Differential diagnosis includes viral infection. Electronically Signed   By: MIven FinnM.D.   On: 01/01/2023 01:36    EKG: I independently viewed the EKG done and my findings are as followed: Normal sinus rhythm at a rate of 96 bpm  Assessment/Plan Present on Admission: **None**  Principal Problem:   Congestive heart failure (CHF) ([place] Active Problems:   Acute pulmonary edema (  [place])   Elevated troponin   Elevated d-dimer   Elevated brain natriuretic peptide (BNP) level   GERD (gastroesophageal reflux disease)   Acquired hypothyroidism   Obesity (BMI 30-39.9)  Acute pulmonary edema possibly secondary to new onset CHF Elevated BNP-764 Chest x-ray and CT angiography of chest suggested pulmonary edema Continue total input/output, daily weights and fluid restriction Continue IV Lasix 40 twice daily Continue heart healthy diet  Echocardiogram in the morning   Elevated troponin possible secondary to type II demand ischemia Troponin already flattened, chest discomfort was only related to cough by patient  Elevated D-dimer D-dimer 0.68, CT angiography of chest ruled out pulmonary  embolism  GERD Continue Pepcid  Acquired hypothyroidism No Synthroid noted in patient's med rec, we shall await updated med rec  Obesity (BMI 32.78) Diet and lifestyle MODIFICATION  DVT prophylaxis: Lovenox  Code Status: Full code  Family Communication: None at bedside  Consults: Cardiology  Severity of Illness: The appropriate patient status for this patient is INPATIENT. Inpatient status is judged to be reasonable and necessary in order to provide the required intensity of service to ensure the patient's safety. The patient's presenting symptoms, physical exam findings, and initial radiographic and laboratory data in the context of their chronic comorbidities is felt to place them at high risk for further clinical deterioration. Furthermore, it is not anticipated that the patient will be medically stable for discharge from the hospital within 2 midnights of admission.   * I certify that at the point of admission it is my clinical judgment that the patient will require inpatient hospital care spanning beyond 2 midnights from the point of admission due to high intensity of service, high risk for further deterioration and high frequency of surveillance required.*  Author: Bernadette Hoit, DO 01/01/2023 7:12 AM  For on call review [email or web address].

## 2023-01-01 NOTE — ED Notes (Signed)
Report called. Pt up at 0900

## 2023-01-02 DIAGNOSIS — I509 Heart failure, unspecified: Secondary | ICD-10-CM | POA: Diagnosis not present

## 2023-01-02 DIAGNOSIS — I5021 Acute systolic (congestive) heart failure: Secondary | ICD-10-CM

## 2023-01-02 DIAGNOSIS — E039 Hypothyroidism, unspecified: Secondary | ICD-10-CM | POA: Diagnosis not present

## 2023-01-02 DIAGNOSIS — R7989 Other specified abnormal findings of blood chemistry: Secondary | ICD-10-CM | POA: Diagnosis not present

## 2023-01-02 LAB — CBC
HCT: 44.4 % (ref 39.0–52.0)
Hemoglobin: 14.2 g/dL (ref 13.0–17.0)
MCH: 23.7 pg — ABNORMAL LOW (ref 26.0–34.0)
MCHC: 32 g/dL (ref 30.0–36.0)
MCV: 74.1 fL — ABNORMAL LOW (ref 80.0–100.0)
Platelets: 198 10*3/uL (ref 150–400)
RBC: 5.99 MIL/uL — ABNORMAL HIGH (ref 4.22–5.81)
RDW: 14.7 % (ref 11.5–15.5)
WBC: 4.9 10*3/uL (ref 4.0–10.5)
nRBC: 0 % (ref 0.0–0.2)

## 2023-01-02 LAB — COMPREHENSIVE METABOLIC PANEL
ALT: 22 U/L (ref 0–44)
AST: 22 U/L (ref 15–41)
Albumin: 3.7 g/dL (ref 3.5–5.0)
Alkaline Phosphatase: 52 U/L (ref 38–126)
Anion gap: 10 (ref 5–15)
BUN: 14 mg/dL (ref 6–20)
CO2: 26 mmol/L (ref 22–32)
Calcium: 9.1 mg/dL (ref 8.9–10.3)
Chloride: 102 mmol/L (ref 98–111)
Creatinine, Ser: 1.18 mg/dL (ref 0.61–1.24)
GFR, Estimated: >60 mL/min (ref >60)
Glucose, Bld: 103 mg/dL — ABNORMAL HIGH (ref 70–99)
Potassium: 3.5 mmol/L (ref 3.5–5.1)
Sodium: 138 mmol/L (ref 135–145)
Total Bilirubin: 0.9 mg/dL (ref 0.3–1.2)
Total Protein: 6.8 g/dL (ref 6.5–8.1)

## 2023-01-02 LAB — HEMOGLOBIN A1C
Hgb A1c MFr Bld: 5.8 % — ABNORMAL HIGH (ref 4.8–5.6)
Mean Plasma Glucose: 119.76 mg/dL

## 2023-01-02 LAB — MAGNESIUM: Magnesium: 1.9 mg/dL (ref 1.7–2.4)

## 2023-01-02 MED ORDER — ALLOPURINOL 100 MG PO TABS
100.0000 mg | ORAL_TABLET | Freq: Every day | ORAL | Status: DC
  Administered 2023-01-02 – 2023-01-04 (×3): 100 mg via ORAL
  Filled 2023-01-02 (×3): qty 1

## 2023-01-02 MED ORDER — POLYETHYLENE GLYCOL 3350 17 G PO PACK: 17.0000 g | PACK | Freq: Every day | ORAL | Status: DC | PRN

## 2023-01-02 MED ORDER — LOSARTAN POTASSIUM 25 MG PO TABS
12.5000 mg | ORAL_TABLET | Freq: Every day | ORAL | Status: DC
  Administered 2023-01-02 – 2023-01-04 (×3): 12.5 mg via ORAL
  Filled 2023-01-02 (×3): qty 1

## 2023-01-02 MED ORDER — LEVOTHYROXINE SODIUM 112 MCG PO TABS
224.0000 ug | ORAL_TABLET | Freq: Every day | ORAL | Status: DC
  Administered 2023-01-03 – 2023-01-04 (×2): 224 ug via ORAL
  Filled 2023-01-02 (×2): qty 2

## 2023-01-02 NOTE — Progress Notes (Signed)
Progress Note   Patient: Elijah Perez. OVZ:[id number] DOB: Nov 01, 1966 DOA: 01/01/2023     1 DOS: the patient was seen and examined on 01/02/2023   Brief hospital course: As per H&P written by Dr. Josephine Cables on 01/01/2023 [name]. is a 57 y.o. male with medical history significant of GERD, gout, prior cancer status post thyroidectomy (2017) who presents to the emergency department due to shortness of breath which started last night when he went to bed.  Patient states that he had shortness of breath when he laid flat in bed, this improved a little when he sat up.  He endorsed about 6-week onset of a nonproductive cough which was associated with some chest soreness from coughing.  He denies leg swelling, shortness of breath on exertion and denies any history of heart problems in the family.   ED Course:  In the emergency department, patient was intermittently tachypneic, BP was 133/99 and other vital signs were within normal range.  Workup in the ED showed normal CBC, MCV is 75, BMP was normal except for blood glucose of 112, troponin x 2 - 35 > 34, BNP 764, D-dimer 0.68.  Influenza A, B, SARS coronavirus 2, RSV was negative. Chest x-ray showed pulmonary edema.  Differential diagnosis includes viral infection CT angiography chest with contrast showed pulmonary edema and small pleural effusions.  Negative for pulmonary embolism Patient was treated with IV Lasix 80 mg x 1.  Hospitalist was asked to admit patient for further evaluation and management.  Assessment and Plan: 1-Acute pulmonary edema/new onset systolic heart failure -Elevated BNP 764 chest x-ray and CT angiogram demonstrating pulmonary edema; no pulmonary embolism appreciated. -Patient denies family history of heart failure. -Education regarding low-sodium diet provided -Reds clip more than 40; some fine crackles appreciated -Continue IV diuresis -Cardiology service has been consulted and will follow any further  recommendations (planning for right and left heart cath when more euvolemic) -2D echo demonstrating ejection fraction 30-35%. -Blood pressure soft, but stable; holding beta-blocker therapy. -Low-dose losartan will be initiated today. -Continue daily weights and strict I's and O's -Follow clinical response.  2-elevated D-dimer -D-dimer 0.68 at time of admission -Patient denies chest pain CT angiogram of the chest has rule out pulmonary embolism -Continue DVT prophylaxis.  3-gastroesophageal reflux disease-continue Pepcid.  4-hyperglycemia -A1c demonstrating prediabetes -Lifestyle changes and modified carbohydrate diet will be recommended -Continue to follow CBGs trend.  5-history of hypothyroidism -TSH 2 months ago within normal limits -Follow repeat TSH -Currently not using Synthroid.  6-DVT prophylaxis -Continue the use of Lovenox.  7-class I obesity -Body mass index is 32.32 kg/m. -Low-calorie diet and portion control along with increase physical activity discussed with patient.   Subjective:  Reporting feeling short winded with activity; patient expressed mild orthopnea.  No chest pain, no nausea, no vomiting, no fever.  Physical Exam: Vitals:   01/01/23 1846 01/01/23 2129 01/02/23 0504 01/02/23 1520  BP: (!) 130/98 (!) 132/106 (!) 107/94 (!) 112/95  Pulse:  85 72 71  Resp:  '18 14 20  '$ Temp:  97.6 F (36.4 C) 98 F (36.7 C) 98.3 F (36.8 C)  TempSrc:  Oral Oral Oral  SpO2:  96% 99% 96%  Weight:   105.1 kg   Height:       General exam: Alert, awake, oriented x 3; good saturation on room air. Respiratory system: Fine crackles appreciated at the bases; no using accessory muscles. Cardiovascular system:RRR. No rubs or gallops; no JVD on exam. Gastrointestinal  system: Abdomen is nondistended, soft and nontender. No organomegaly or masses felt. Normal bowel sounds heard. Central nervous system: Alert and oriented. No focal neurological deficits. Extremities: No  cyanosis or clubbing; positive trace edema bilaterally. Skin: No petechiae. Psychiatry: Judgement and insight appear normal. Mood & affect appropriate.   Data Reviewed: A1c: 5.8 Magnesium 1.9 TSH: Pending CBC: White blood cells 4.9, hemoglobin 14.2 platelet count 198 K -Comprehensive metabolic panel: Sodium 953, potassium 3.5, chloride 102, bicarb 26, glucose 103>>112, BUN 14, creatinine 1.18 and normal LFTs.  Family Communication: whole family at bedside.  Disposition: Status is: Inpatient Remains inpatient appropriate because: Continue IV diuresis and further evaluation for acute systolic heart failure.   Planned Discharge Destination: Home  Time spent: 35 minutes  Author: Barton Dubois, MD 01/02/2023 5:10 PM  For on call review [email or web address].

## 2023-01-02 NOTE — Progress Notes (Signed)
   01/02/23 1158  ReDS Vest / Clip  Station Marker D  Ruler Value 36  ReDS Value Range (!) > 40  ReDS Actual Value 45    Patient states decreased output since dose of Lasix in the ED. States breathing is improving. Currently on 2L at 98%, will attempt to wean off O2. Currently maintaining 97% on Room Air.   Does not currently follow cardiology outpatient, this is a new CHF diagnosis for him. Will provide education with Heart Failure book for signs and what to watch for with new diagnosis.

## 2023-01-02 NOTE — Consult Note (Addendum)
Cardiology Consultation   Patient ID: Elijah Perez. MRN: [id number]; DOB: [birth year]  Admit date: 01/01/2023 Date of Consult: 01/02/2023  PCP:  [facility], [facility] Cardiologist:  None   {  Patient Profile:   Elijah Perez. is a 57 y.o. male with a hx of thyroid cancer, s/p thyroidectomy in 2017, GERD who is being seen 01/02/2023 for the evaluation of new onset heart failure at the request of Dr. Dyann Kief.  History of Present Illness:   Mr. Heidenreich has no prior cardiac history.  Patient normally follows at the [place].  Patient stopped smoking 30 years ago.  He drinks scotch on the weekends.  No drug history.  The patient presented to the ER at [facility] 01/01/22 with shortness of breath.  Reported a cough for the last few months.  He he noted his breathing got significantly worse yesterday.  He was unable to lay flat. Breathing was worse on exertion.  Patient denied chest pain and lower leg edema.  In the ER BP 128/94, pulse 83, temperature 98.1, respiratory rate 21, 97% O2. labs showed K 4.0, CO2 24, Scr 1.18, BUN 13, Hgb 13.1, WBC 5.4. Resp panel negative.  HS trop 35>34. Ddimer 0.68. BNP 764. CXR showed pulmonary edema.  EKG showed sinus rhythm with anterior Q waves.  Patient was given IV Lasix and admitted for further workup.  Past Medical History:  Diagnosis Date   Cancer ([place])    Horner's syndrome     Past Surgical History:  Procedure Laterality Date   EYE SURGERY     THYROIDECTOMY Bilateral      Home Medications:  Prior to Admission medications   Medication Sig Start Date End Date Taking? Authorizing Provider  allopurinol (ZYLOPRIM) 100 MG tablet Take 100 mg by mouth daily. 11/26/19  Yes [provider]  diphenhydrAMINE (BENADRYL) 25 mg capsule Take 25 mg by mouth every 6 (six) hours as needed for allergies or itching.   Yes [provider]  levothyroxine (SYNTHROID) 112 MCG tablet Take 2 tablets by mouth daily. 07/31/22   Yes [provider]  omeprazole (PRILOSEC) 20 MG capsule Take 20 mg by mouth daily.   Yes [provider]    Inpatient Medications: Scheduled Meds:  enoxaparin (LOVENOX) injection  50 mg Subcutaneous Daily   famotidine  20 mg Oral BID   furosemide  40 mg Intravenous Q12H   losartan  12.5 mg Oral Daily   Continuous Infusions:  PRN Meds: acetaminophen **OR** acetaminophen, ondansetron **OR** ondansetron (ZOFRAN) IV, polyethylene glycol  Allergies:    Allergies  Allergen Reactions   Other Other (See Comments)    Pet dander, pollen -> eyes itchy/watery, sneezing   Shellfish Allergy Itching    Social History:   Social History   Socioeconomic History   Marital status: Married    Spouse name: Not on file   Number of children: Not on file   Years of education: Not on file   Highest education level: Not on file  Occupational History   Not on file  Tobacco Use   Smoking status: Former    Types: Cigarettes    Quit date: 1996    Years since quitting: 28.0   Smokeless tobacco: Never  Vaping Use   Vaping Use: Never used  Substance and Sexual Activity   Alcohol use: Not Currently    Alcohol/week: 2.0 standard drinks of alcohol    Types: 2 Glasses of wine per week  Drug use: Not Currently   Sexual activity: Not on file  Other Topics Concern   Not on file  Social History Narrative   Not on file   Social Determinants of Health   Financial Resource Strain: Not on file  Food Insecurity: No Food Insecurity (01/01/2023)   Hunger Vital Sign    Worried About Running Out of Food in the Last Year: Never true    Ran Out of Food in the Last Year: Never true  Transportation Needs: No Transportation Needs (01/01/2023)   PRAPARE - Hydrologist (Medical): No    Lack of Transportation (Non-Medical): No  Physical Activity: Not on file  Stress: Not on file  Social Connections: Not on file  Intimate Partner Violence: Not At Risk (01/01/2023)    Humiliation, Afraid, Rape, and Kick questionnaire    Fear of Current or Ex-Partner: No    Emotionally Abused: No    Physically Abused: No    Sexually Abused: No    Family History:   History reviewed. No pertinent family history.   ROS:  Please see the history of present illness.   All other ROS reviewed and negative.     Physical Exam/Data:   Vitals:   01/01/23 1806 01/01/23 1846 01/01/23 2129 01/02/23 0504  BP: (!) 136/103 (!) 130/98 (!) 132/106 (!) 107/94  Pulse:   85 72  Resp:   18 14  Temp: 98 F (36.7 C)  97.6 F (36.4 C) 98 F (36.7 C)  TempSrc: Oral  Oral Oral  SpO2:   96% 99%  Weight:    105.1 kg  Height:        Intake/Output Summary (Last 24 hours) at 01/02/2023 0957 Last data filed at 01/02/2023 0850 Gross per 24 hour  Intake 240 ml  Output 2975 ml  Net -2735 ml      01/02/2023    5:04 AM 01/01/2023    1:09 AM 04/24/2022    2:43 PM  Last 3 Weights  Weight (lbs) 231 lb 11.2 oz 235 lb 235 lb  Weight (kg) 105.098 kg 106.595 kg 106.595 kg     Body mass index is 32.32 kg/m.  General:  Well nourished, well developed, in no acute distress HEENT: normal Neck: no JVD Vascular: No carotid bruits; Distal pulses 2+ bilaterally Cardiac:  normal S1, S2; RRR; no murmur  Lungs:  diminished breath sounds  Abd: soft, nontender, no hepatomegaly  Ext: no edema Musculoskeletal:  No deformities, BUE and BLE strength normal and equal Skin: warm and dry  Neuro:  CNs 2-12 intact, no focal abnormalities noted Psych:  Normal affect   EKG:  The EKG was personally reviewed and demonstrates:  Nsr 96bpm, ant q waves   Relevant CV Studies:  Echo 01/01/23  1. Left ventricular ejection fraction, by estimation, is 30 to 35%. The  left ventricle has moderately decreased function. The left ventricle  demonstrates global hypokinesis. The left ventricular internal cavity size  was mildly dilated. Left ventricular  diastolic parameters are indeterminate.   2. Right ventricular  systolic function is normal. The right ventricular  size is normal.   3. Left atrial size was severely dilated.   4. Right atrial size was mildly dilated.   5. The mitral valve is abnormal. Mild mitral valve regurgitation. No  evidence of mitral stenosis.   6. The aortic valve is tricuspid. There is mild calcification of the  aortic valve. There is mild thickening of the aortic valve. Aortic  valve  regurgitation is mild. No aortic stenosis is present.   7. The inferior vena cava is normal in size with greater than 50%  respiratory variability, suggesting right atrial pressure of 3 mmHg.   Laboratory Data:  High Sensitivity Troponin:   Recent Labs  Lab 01/01/23 0227 01/01/23 0344  TROPONINIHS 35* 34*     Chemistry Recent Labs  Lab 01/01/23 0227 01/01/23 1057 01/02/23 0427  NA 136  --  138  K 4.0  --  3.5  CL 105  --  102  CO2 24  --  26  GLUCOSE 112*  --  103*  BUN 13  --  14  CREATININE 1.18  --  1.18  CALCIUM 8.9  --  9.1  MG  --  1.8 1.9  GFRNONAA >60  --  >60  ANIONGAP 7  --  10    Recent Labs  Lab 01/02/23 0427  PROT 6.8  ALBUMIN 3.7  AST 22  ALT 22  ALKPHOS 52  BILITOT 0.9   Lipids No results for input(s): "CHOL", "TRIG", "HDL", "LABVLDL", "LDLCALC", "CHOLHDL" in the last 168 hours.  Hematology Recent Labs  Lab 01/01/23 0227 01/02/23 0427  WBC 5.4 4.9  RBC 5.63 5.99*  HGB 13.1 14.2  HCT 42.2 44.4  MCV 75.0* 74.1*  MCH 23.3* 23.7*  MCHC 31.0 32.0  RDW 14.7 14.7  PLT 164 198   Thyroid No results for input(s): "TSH", "FREET4" in the last 168 hours.  BNP Recent Labs  Lab 01/01/23 0227  BNP 764.0*    DDimer  Recent Labs  Lab 01/01/23 0227  DDIMER 0.68*     Radiology/Studies:  ECHOCARDIOGRAM COMPLETE  Result Date: 01/01/2023    ECHOCARDIOGRAM REPORT   Patient Name:   Zigmond Trela. Date of Exam: 01/01/2023 Medical Rec #:  [id number]          Height:       71.0 in Accession #:    [id number]         Weight:       235.0 lb Date of  Birth:  25-Aug-1966         BSA:          2.258 m Patient Age:    39 years           BP:           125/98 mmHg Patient Gender: M                  HR:           83 bpm. Exam Location:  [facility] Procedure: 2D Echo, Cardiac Doppler, Color Doppler and Intracardiac            Opacification Agent Indications:    CHF  History:        Patient has no prior history of Echocardiogram examinations.                 CHF.  Sonographer:    Wenda Low Referring Phys: [id number] OLADAPO ADEFESO IMPRESSIONS  1. Left ventricular ejection fraction, by estimation, is 30 to 35%. The left ventricle has moderately decreased function. The left ventricle demonstrates global hypokinesis. The left ventricular internal cavity size was mildly dilated. Left ventricular diastolic parameters are indeterminate.  2. Right ventricular systolic function is normal. The right ventricular size is normal.  3. Left atrial size was severely dilated.  4. Right atrial size was mildly dilated.  5. The mitral valve is  abnormal. Mild mitral valve regurgitation. No evidence of mitral stenosis.  6. The aortic valve is tricuspid. There is mild calcification of the aortic valve. There is mild thickening of the aortic valve. Aortic valve regurgitation is mild. No aortic stenosis is present.  7. The inferior vena cava is normal in size with greater than 50% respiratory variability, suggesting right atrial pressure of 3 mmHg. FINDINGS  Left Ventricle: Left ventricular ejection fraction, by estimation, is 30 to 35%. The left ventricle has moderately decreased function. The left ventricle demonstrates global hypokinesis. Definity contrast agent was given IV to delineate the left ventricular endocardial borders. The left ventricular internal cavity size was mildly dilated. There is no left ventricular hypertrophy. Left ventricular diastolic parameters are indeterminate. Right Ventricle: The right ventricular size is normal. Right vetricular wall thickness was not well  visualized. Right ventricular systolic function is normal. Left Atrium: Left atrial size was severely dilated. Right Atrium: Right atrial size was mildly dilated. Pericardium: There is no evidence of pericardial effusion. Mitral Valve: The mitral valve is abnormal. Mild mitral valve regurgitation. No evidence of mitral valve stenosis. MV peak gradient, 4.5 mmHg. The mean mitral valve gradient is 2.0 mmHg. Tricuspid Valve: The tricuspid valve is normal in structure. Tricuspid valve regurgitation is not demonstrated. No evidence of tricuspid stenosis. Aortic Valve: The aortic valve is tricuspid. There is mild calcification of the aortic valve. There is mild thickening of the aortic valve. There is mild aortic valve annular calcification. Aortic valve regurgitation is mild. No aortic stenosis is present. Aortic valve mean gradient measures 3.0 mmHg. Aortic valve peak gradient measures 4.6 mmHg. Aortic valve area, by VTI measures 2.51 cm. Pulmonic Valve: The pulmonic valve was not well visualized. Pulmonic valve regurgitation is mild. No evidence of pulmonic stenosis. Aorta: The aortic root is normal in size and structure. Venous: The inferior vena cava is normal in size with greater than 50% respiratory variability, suggesting right atrial pressure of 3 mmHg. IAS/Shunts: No atrial level shunt detected by color flow Doppler.  LEFT VENTRICLE PLAX 2D LVIDd:         6.30 cm   Diastology LVIDs:         4.90 cm   LV e' medial:    7.50 cm/s LV PW:         1.10 cm   LV E/e' medial:  10.3 LV IVS:        1.10 cm   LV e' lateral:   11.30 cm/s LVOT diam:     2.20 cm   LV E/e' lateral: 6.9 LV SV:         55 LV SV Index:   24 LVOT Area:     3.80 cm  RIGHT VENTRICLE RV Basal diam:  3.70 cm RV Mid diam:    2.70 cm RV S prime:     11.70 cm/s TAPSE (M-mode): 2.2 cm LEFT ATRIUM              Index        RIGHT ATRIUM           Index LA diam:        5.70 cm  2.52 cm/m   RA Area:     20.90 cm LA Vol (A2C):   138.0 ml 61.11 ml/m  RA  Volume:   57.80 ml  25.60 ml/m LA Vol (A4C):   109.0 ml 48.27 ml/m LA Biplane Vol: 130.0 ml 57.57 ml/m  AORTIC VALVE  PULMONIC VALVE AV Area (Vmax):    2.81 cm     PV Vmax:       0.78 m/s AV Area (Vmean):   2.68 cm     PV Peak grad:  2.4 mmHg AV Area (VTI):     2.51 cm AV Vmax:           107.00 cm/s AV Vmean:          82.400 cm/s AV VTI:            0.220 m AV Peak Grad:      4.6 mmHg AV Mean Grad:      3.0 mmHg LVOT Vmax:         79.00 cm/s LVOT Vmean:        58.200 cm/s LVOT VTI:          0.145 m LVOT/AV VTI ratio: 0.66  AORTA Ao Root diam: 3.30 cm MITRAL VALVE MV Area (PHT): 4.52 cm    SHUNTS MV Area VTI:   2.74 cm    Systemic VTI:  0.14 m MV Peak grad:  4.5 mmHg    Systemic Diam: 2.20 cm MV Mean grad:  2.0 mmHg MV Vmax:       1.06 m/s MV Vmean:      57.8 cm/s MV Decel Time: 168 msec MV E velocity: 77.60 cm/s Carlyle Dolly MD Electronically signed by Carlyle Dolly MD Signature Date/Time: 01/01/2023/2:27:08 PM    Final    CT Angio Chest PE W/Cm &/Or Wo Cm  Result Date: 01/01/2023 CLINICAL DATA:  Pulmonary embolism suspected, positive D-dimer with low to intermediate probability. Dry cough with wheezing. EXAM: CT ANGIOGRAPHY CHEST WITH CONTRAST TECHNIQUE: Multidetector CT imaging of the chest was performed using the standard protocol during bolus administration of intravenous contrast. Multiplanar CT image reconstructions and MIPs were obtained to evaluate the vascular anatomy. RADIATION DOSE REDUCTION: This exam was performed according to the departmental dose-optimization program which includes automated exposure control, adjustment of the mA and/or kV according to patient size and/or use of iterative reconstruction technique. CONTRAST:  170m OMNIPAQUE IOHEXOL 350 MG/ML SOLN COMPARISON:  Radiograph from earlier today FINDINGS: Cardiovascular: Satisfactory opacification of the pulmonary arteries to the segmental level. No evidence of pulmonary embolism. Normal heart size. No  pericardial effusion. Mediastinum/Nodes: Negative for adenopathy or mass Lungs/Pleura: Airway cuffing and interlobular septal thickening diffusely with airspace density at the bases, somewhat greater on the right but still bilateral and dependent. Small bilateral pleural effusion. Upper Abdomen: No acute finding Musculoskeletal: No acute finding Review of the MIP images confirms the above findings. IMPRESSION: 1. Pulmonary edema and small pleural effusions. 2. Negative for pulmonary embolism. Electronically Signed   By: JJorje GuildM.D.   On: 01/01/2023 04:27   DG Chest 2 View  Result Date: 01/01/2023 CLINICAL DATA:  SOB. Pt c/o SOB that started around 2100. Pt endorses a dry cough with audible wheezes. Denies, fever, n/v/d. Denies any cardiac or pulmonary hx. EXAM: CHEST - 2 VIEW COMPARISON:  Chest x-ray 04/24/2022 FINDINGS: The heart and mediastinal contours are unchanged No focal consolidation. Interval development of increased interstitial markings. No pleural effusion. No pneumothorax. No acute osseous abnormality. IMPRESSION: Pulmonary edema.  Differential diagnosis includes viral infection. Electronically Signed   By: MIven FinnM.D.   On: 01/01/2023 01:36     Assessment and Plan:   New onset Acute systolic CHF  -Presented with acute onset shortness of breath and cough for couple months. -In the ER BNP 764.  Chest x-ray showed pulmonary edema -Dimer was elevated, CT of the chest showed no PE; pulmonary edema and small pleural effusions - IV lasix '40mg'$  BID -So far he has had good urine output -5 L. - Breathing is improving. He does not appear significantly volume up on exam -Echo showed newly reduced LVEF 30 to 35%, global hypokinesis, severely dilated left atrium, mildly dilated right atrium, normal RV function, mild MR. -No prior history of cardiomyopathy, etiology unknown -Patient is willing to undergo cardiac catheterization to assess for ischemia - started on Losartan 12.'5mg'$   daily. BP is soft. Continue GDMT as able - kidney function stable with diuresis -Monitor daily weights, strict I's and O's, kidney function with diuresis  Elevated troponin -High-sensitivity troponin elevated to 35 with flat trend, not consistent with ACS -Suspect supply demand mismatch in the setting of acute heart failure -Patient has no prior CAD history -Patient will need left heart cath to evaluate for ischemia - risk stratify with lipid panel and A1C  Elevated Ddimer -CT of the chest negative for PE   For questions or updates, please contact [place] Please consult [email or web address] for contact info under    Signed, Cadence Arlyss Repress  01/02/2023 9:57 AM   Attending note   Patient seen and discussed with PA Kathlen Mody, I agree with her documentation. 57 yo male no prior cardiac history admitted with SOB and cough. +orthopnea. Evidence of fluid overload on presentation and new onset clinical HF. Cardiology consulted to help with management     K 4 BUN 13 Cr 1.18 WBC 5.4 Hgb 13.1 Plt 164 Ddimer 0.68 BNP 764 HIV neg Trop 35-->34 CXR pulm edema CT PE: pulm edema, small effusions. No PE EKG NSR Echo: LVEF 57-01%, indet diastolic fxn, normal RV, severe LAE, mild MR       1.Acute HFrEF - new onset  HF, echo LVEF 30-35% - BNP 764, pulm edema on CXR and CT - on IV lasix '40mg'$  bid, neg 3.9 L yesterday and 4.5 L since admission. Renal function is stable.   - unclear etiology. No FH of HF, no drug use. Pretty heavy EtOH use on the weekends only nothing during the week. No recent viral illnesses. No known history of coronary disease. Plan for RHC/LHC when more euvolemic, I think very likely may be able to go tomorrow. Will make npo tonight in case.    - start beta blocker when more euvolemic. Soft bp's this AM not clear will tolerate entresto, start losartan 12.'5mg'$  daiily today. Likely MRA and SGLT2i tomorrow.        Carlyle Dolly MD

## 2023-01-02 NOTE — Progress Notes (Incomplete)
Attending note  Patient seen and discussed with PA Kathlen Mody, I agree with her documentation. 57 yo male no prior cardiac history admitted with SOB and cough. +orthopnea. Evidence of fluid overload on presentation and new onset clinical HF. Cardiology consulted to help with management   K 4 BUN 13 Cr 1.18 WBC 5.4 Hgb 13.1 Plt 164 Ddimer 0.68 BNP 764 HIV neg Trop 35-->34 CXR pulm edema CT PE: pulm edema, small effusions. No PE EKG NSR Echo: LVEF 47-42%, indet diastolic fxn, normal RV, severe LAE, mild MR    1.Acute HFrEF - new onset  HF, echo LVEF 30-35% - BNP 764, pulm edema on CXR and CT - on IV lasix '40mg'$  bid, neg 3.9 L yesterday and 4.5 L since admission. Renal function is stable.  - unclear etiology. No FH of HF, no drug use. Pretty heavy EtOH use on the weekends only nothing during the week. No recent viral illnesses. No known history of coronary disease. Plan for RHC/LHC when more euvolemic, I think very likely may be able to go tomorrow. Will make npo tonight in case.   - start beta blocker when more euvolemic. Soft bp's this AM not clear will tolerate entresto, start losartan 12.'5mg'$  daiily today. Likely MRA and SGLT2i tomorrow.     Carlyle Dolly MD

## 2023-01-03 ENCOUNTER — Encounter (HOSPITAL_COMMUNITY)
Admission: EM | Disposition: A | Discharge status: Home / Self Care | Payer: Self-pay | Source: Home / Self Care | Attending: Internal Medicine

## 2023-01-03 DIAGNOSIS — E039 Hypothyroidism, unspecified: Secondary | ICD-10-CM | POA: Diagnosis not present

## 2023-01-03 DIAGNOSIS — R7989 Other specified abnormal findings of blood chemistry: Secondary | ICD-10-CM | POA: Diagnosis not present

## 2023-01-03 DIAGNOSIS — I5021 Acute systolic (congestive) heart failure: Secondary | ICD-10-CM | POA: Diagnosis not present

## 2023-01-03 DIAGNOSIS — I509 Heart failure, unspecified: Secondary | ICD-10-CM | POA: Diagnosis not present

## 2023-01-03 HISTORY — PX: RIGHT/LEFT HEART CATH AND CORONARY ANGIOGRAPHY: CATH118266

## 2023-01-03 LAB — POCT I-STAT EG7
Acid-Base Excess: 0 mmol/L (ref 0.0–2.0)
Acid-Base Excess: 2 mmol/L (ref 0.0–2.0)
Bicarbonate: 25.3 mmol/L (ref 20.0–28.0)
Bicarbonate: 27.1 mmol/L (ref 20.0–28.0)
Calcium, Ion: 1.05 mmol/L — ABNORMAL LOW (ref 1.15–1.40)
Calcium, Ion: 1.25 mmol/L (ref 1.15–1.40)
HCT: 44 % (ref 39.0–52.0)
HCT: 47 % (ref 39.0–52.0)
Hemoglobin: 15 g/dL (ref 13.0–17.0)
Hemoglobin: 16 g/dL (ref 13.0–17.0)
O2 Saturation: 63 %
O2 Saturation: 65 %
Potassium: 3.3 mmol/L — ABNORMAL LOW (ref 3.5–5.1)
Potassium: 3.7 mmol/L (ref 3.5–5.1)
Sodium: 138 mmol/L (ref 135–145)
Sodium: 142 mmol/L (ref 135–145)
TCO2: 27 mmol/L (ref 22–32)
TCO2: 28 mmol/L (ref 22–32)
pCO2, Ven: 41.6 mmHg — ABNORMAL LOW (ref 44–60)
pCO2, Ven: 44.5 mmHg (ref 44–60)
pH, Ven: 7.392 (ref 7.25–7.43)
pH, Ven: 7.393 (ref 7.25–7.43)
pO2, Ven: 33 mmHg (ref 32–45)
pO2, Ven: 34 mmHg (ref 32–45)

## 2023-01-03 LAB — BASIC METABOLIC PANEL
Anion gap: 11 (ref 5–15)
BUN: 14 mg/dL (ref 6–20)
CO2: 26 mmol/L (ref 22–32)
Calcium: 9.2 mg/dL (ref 8.9–10.3)
Chloride: 100 mmol/L (ref 98–111)
Creatinine, Ser: 1.27 mg/dL — ABNORMAL HIGH (ref 0.61–1.24)
GFR, Estimated: >60 mL/min (ref >60)
Glucose, Bld: 105 mg/dL — ABNORMAL HIGH (ref 70–99)
Potassium: 3.5 mmol/L (ref 3.5–5.1)
Sodium: 137 mmol/L (ref 135–145)

## 2023-01-03 LAB — LIPID PANEL
Cholesterol: 209 mg/dL — ABNORMAL HIGH (ref 0–200)
HDL: 99 mg/dL (ref >40)
LDL Cholesterol: 81 mg/dL (ref 0–99)
Total CHOL/HDL Ratio: 2.1 RATIO
Triglycerides: 147 mg/dL (ref <150)
VLDL: 29 mg/dL (ref 0–40)

## 2023-01-03 LAB — POCT I-STAT 7, (LYTES, BLD GAS, ICA,H+H)
Acid-base deficit: 5 mmol/L — ABNORMAL HIGH (ref 0.0–2.0)
Bicarbonate: 18.6 mmol/L — ABNORMAL LOW (ref 20.0–28.0)
Calcium, Ion: 0.81 mmol/L — CL (ref 1.15–1.40)
HCT: 37 % — ABNORMAL LOW (ref 39.0–52.0)
Hemoglobin: 12.6 g/dL — ABNORMAL LOW (ref 13.0–17.0)
O2 Saturation: 95 %
Potassium: 2.5 mmol/L — CL (ref 3.5–5.1)
Sodium: 149 mmol/L — ABNORMAL HIGH (ref 135–145)
TCO2: 20 mmol/L — ABNORMAL LOW (ref 22–32)
pCO2 arterial: 30.5 mmHg — ABNORMAL LOW (ref 32–48)
pH, Arterial: 7.394 (ref 7.35–7.45)
pO2, Arterial: 74 mmHg — ABNORMAL LOW (ref 83–108)

## 2023-01-03 LAB — TSH: TSH: 1.629 u[IU]/mL (ref 0.350–4.500)

## 2023-01-03 SURGERY — RIGHT/LEFT HEART CATH AND CORONARY ANGIOGRAPHY
Anesthesia: LOCAL

## 2023-01-03 MED ORDER — SODIUM CHLORIDE 0.9 % IV SOLN: INTRAVENOUS | Status: DC

## 2023-01-03 MED ORDER — SODIUM CHLORIDE 0.9 % IV SOLN: 250.0000 mL | INTRAVENOUS | Status: DC | PRN

## 2023-01-03 MED ORDER — FENTANYL CITRATE (PF) 100 MCG/2ML IJ SOLN
INTRAMUSCULAR | Status: AC
  Filled 2023-01-03: qty 2

## 2023-01-03 MED ORDER — IOHEXOL 350 MG/ML SOLN
INTRAVENOUS | Status: DC | PRN
  Administered 2023-01-03: 40 mL

## 2023-01-03 MED ORDER — PANTOPRAZOLE SODIUM 40 MG PO TBEC
40.0000 mg | DELAYED_RELEASE_TABLET | Freq: Every day | ORAL | Status: DC
  Administered 2023-01-04: 40 mg via ORAL
  Filled 2023-01-03: qty 1

## 2023-01-03 MED ORDER — HEPARIN (PORCINE) IN NACL 1000-0.9 UT/500ML-% IV SOLN
INTRAVENOUS | Status: AC
  Filled 2023-01-03: qty 1000

## 2023-01-03 MED ORDER — SODIUM CHLORIDE 0.9% FLUSH
3.0000 mL | Freq: Two times a day (BID) | INTRAVENOUS | Status: DC
  Administered 2023-01-03 – 2023-01-04 (×2): 3 mL via INTRAVENOUS

## 2023-01-03 MED ORDER — ACETAMINOPHEN 325 MG PO TABS
ORAL_TABLET | ORAL | Status: AC
  Filled 2023-01-03: qty 2

## 2023-01-03 MED ORDER — MIDAZOLAM HCL 2 MG/2ML IJ SOLN
INTRAMUSCULAR | Status: AC
  Filled 2023-01-03: qty 2

## 2023-01-03 MED ORDER — FENTANYL CITRATE (PF) 100 MCG/2ML IJ SOLN
INTRAMUSCULAR | Status: DC | PRN
  Administered 2023-01-03: 25 ug via INTRAVENOUS

## 2023-01-03 MED ORDER — SODIUM CHLORIDE 0.9% FLUSH: 3.0000 mL | INTRAVENOUS | Status: DC | PRN

## 2023-01-03 MED ORDER — SODIUM CHLORIDE 0.9 % IV SOLN: INTRAVENOUS | Status: AC

## 2023-01-03 MED ORDER — LABETALOL HCL 5 MG/ML IV SOLN: 10.0000 mg | INTRAVENOUS | Status: AC | PRN

## 2023-01-03 MED ORDER — VERAPAMIL HCL 2.5 MG/ML IV SOLN
INTRAVENOUS | Status: DC | PRN
  Administered 2023-01-03: 10 mL via INTRA_ARTERIAL

## 2023-01-03 MED ORDER — HYDRALAZINE HCL 20 MG/ML IJ SOLN: 10.0000 mg | INTRAMUSCULAR | Status: AC | PRN

## 2023-01-03 MED ORDER — SODIUM CHLORIDE 0.9% FLUSH
3.0000 mL | Freq: Two times a day (BID) | INTRAVENOUS | Status: DC
  Administered 2023-01-03: 3 mL via INTRAVENOUS

## 2023-01-03 MED ORDER — DAPAGLIFLOZIN PROPANEDIOL 10 MG PO TABS
10.0000 mg | ORAL_TABLET | Freq: Every day | ORAL | Status: DC
  Administered 2023-01-03 – 2023-01-04 (×2): 10 mg via ORAL
  Filled 2023-01-03 (×2): qty 1

## 2023-01-03 MED ORDER — HEPARIN SODIUM (PORCINE) 1000 UNIT/ML IJ SOLN
INTRAMUSCULAR | Status: DC | PRN
  Administered 2023-01-03: 5000 [IU] via INTRAVENOUS

## 2023-01-03 MED ORDER — HEPARIN SODIUM (PORCINE) 1000 UNIT/ML IJ SOLN
INTRAMUSCULAR | Status: AC
  Filled 2023-01-03: qty 10

## 2023-01-03 MED ORDER — LIDOCAINE HCL (PF) 1 % IJ SOLN
INTRAMUSCULAR | Status: DC | PRN
  Administered 2023-01-03 (×2): 2 mL

## 2023-01-03 MED ORDER — VERAPAMIL HCL 2.5 MG/ML IV SOLN
INTRAVENOUS | Status: AC
  Filled 2023-01-03: qty 2

## 2023-01-03 MED ORDER — LIDOCAINE HCL (PF) 1 % IJ SOLN
INTRAMUSCULAR | Status: AC
  Filled 2023-01-03: qty 30

## 2023-01-03 MED ORDER — MIDAZOLAM HCL 2 MG/2ML IJ SOLN
INTRAMUSCULAR | Status: DC | PRN
  Administered 2023-01-03: 1 mg via INTRAVENOUS

## 2023-01-03 MED ORDER — ASPIRIN 81 MG PO CHEW
81.0000 mg | CHEWABLE_TABLET | ORAL | Status: AC
  Administered 2023-01-03: 81 mg via ORAL
  Filled 2023-01-03: qty 1

## 2023-01-03 MED ORDER — ASPIRIN 81 MG PO CHEW: 81.0000 mg | CHEWABLE_TABLET | ORAL | Status: DC

## 2023-01-03 MED ORDER — HEPARIN (PORCINE) IN NACL 1000-0.9 UT/500ML-% IV SOLN
INTRAVENOUS | Status: DC | PRN
  Administered 2023-01-03 (×2): 500 mL

## 2023-01-03 MED ORDER — METOPROLOL SUCCINATE ER 25 MG PO TB24
12.5000 mg | ORAL_TABLET | Freq: Every day | ORAL | Status: DC
  Administered 2023-01-03 – 2023-01-04 (×2): 12.5 mg via ORAL
  Filled 2023-01-03 (×2): qty 1

## 2023-01-03 SURGICAL SUPPLY — 10 items
CATH 5FR JL3.5 JR4 ANG PIG MP (CATHETERS) IMPLANT
CATH BALLN WEDGE 5F 110CM (CATHETERS) IMPLANT
GLIDESHEATH SLEND SS 6F .021 (SHEATH) IMPLANT
GUIDEWIRE INQWIRE 1.5J.035X260 (WIRE) IMPLANT
INQWIRE 1.5J .035X260CM (WIRE) ×1
KIT HEART LEFT (KITS) ×1 IMPLANT
PACK CARDIAC CATHETERIZATION (CUSTOM PROCEDURE TRAY) ×1 IMPLANT
SHEATH GLIDE SLENDER 4/5FR (SHEATH) IMPLANT
TRANSDUCER W/STOPCOCK (MISCELLANEOUS) ×1 IMPLANT
TUBING CIL FLEX 10 FLL-RA (TUBING) ×1 IMPLANT

## 2023-01-03 NOTE — Plan of Care (Signed)
  Problem: Education: Goal: Knowledge of General Education information will improve Description: Including pain rating scale, medication(s)/side effects and non-pharmacologic comfort measures Outcome: Progressing   Problem: Health Behavior/Discharge Planning: Goal: Ability to manage health-related needs will improve Outcome: Progressing   Problem: Clinical Measurements: Goal: Cardiovascular complication will be avoided Outcome: Progressing   Problem: Safety: Goal: Ability to remain free from injury will improve Outcome: Progressing   Problem: Skin Integrity: Goal: Risk for impaired skin integrity will decrease Outcome: Progressing   Problem: Cardiovascular: Goal: Ability to achieve and maintain adequate cardiovascular perfusion will improve Outcome: Progressing Goal: Vascular access site(s) Level 0-1 will be maintained Outcome: Progressing

## 2023-01-03 NOTE — Progress Notes (Signed)
Progress Note   Patient: Elijah Perez. TGG:[id number] DOB: 02-May-1966 DOA: 01/01/2023     2 DOS: the patient was seen and examined on 01/03/2023   Brief hospital course: As per H&P written by Dr. Josephine Cables on 01/01/2023 Elijah Perez. is a 57 y.o. male with medical history significant of GERD, gout, prior cancer status post thyroidectomy (2017) who presents to the emergency department due to shortness of breath which started last night when he went to bed.  Patient states that he had shortness of breath when he laid flat in bed, this improved a little when he sat up.  He endorsed about 6-week onset of a nonproductive cough which was associated with some chest soreness from coughing.  He denies leg swelling, shortness of breath on exertion and denies any history of heart problems in the family.   ED Course:  In the emergency department, patient was intermittently tachypneic, BP was 133/99 and other vital signs were within normal range.  Workup in the ED showed normal CBC, MCV is 75, BMP was normal except for blood glucose of 112, troponin x 2 - 35 > 34, BNP 764, D-dimer 0.68.  Influenza A, B, SARS coronavirus 2, RSV was negative. Chest x-ray showed pulmonary edema.  Differential diagnosis includes viral infection CT angiography chest with contrast showed pulmonary edema and small pleural effusions.  Negative for pulmonary embolism Patient was treated with IV Lasix 80 mg x 1.  Hospitalist was asked to admit patient for further evaluation and management.  Assessment and Plan: 1-Acute pulmonary edema/new onset systolic heart failure -Elevated BNP 764 chest x-ray and CT angiogram demonstrating pulmonary edema; no pulmonary embolism appreciated. -Patient denies family history of heart failure. -Education regarding low-sodium diet provided -Continue to follow diuresis as per cardiology service.  Patient is more euvolemic at this point.  No crackles on exam and denying orthopnea. -Cardiology  service consultation appreciated; planning to transfer to [facility] under cardiology service today. -2D echo demonstrating ejection fraction 30-35%. -Blood pressure soft, but stable; holding beta-blocker therapy at this moment as per cardiology recommendations.. -Low-dose losartan in the shaded on 01/02/2023. -Continue to follow daily weights and strict I's and O's -Planning to transfer patient to [facility] for right and left heart cath evaluation/management of his heart failure including further tuneup on his GDMT meds.  2-elevated D-dimer -D-dimer 0.68 at time of admission -Patient denies chest pain CT angiogram of the chest has rule out pulmonary embolism -Continue DVT prophylaxis.  3-gastroesophageal reflux disease -continue Pepcid.  4-hyperglycemia -A1c demonstrating prediabetes -Lifestyle changes and modified carbohydrate diet will be recommended at time of discharge. -Continue to follow CBGs trend.  5-history of hypothyroidism -TSH 2 months ago within normal limits -TSH has remained within normal limits. -Currently not using Synthroid.  6-DVT prophylaxis -Continue the use of Lovenox.  7-class I obesity -Body mass index is 31.87 kg/m. -Low-calorie diet and portion control along with increase physical activity discussed with patient.   Subjective:  Reported significant improvement in his breathing; patient expressed no orthopnea symptoms.  Denies chest pain, palpitations, fever, nausea, vomiting or any other complaints currently.  Physical Exam: Vitals:   01/02/23 2120 01/02/23 2350 01/03/23 0405 01/03/23 0633  BP: (!) 130/99   (!) 115/90  Pulse:  67 69   Resp:  13 15   Temp: 97.7 F (36.5 C)   97.7 F (36.5 C)  TempSrc: Oral     SpO2:  98% 98%   Weight:  103.6 kg  Height:       General exam: Alert, awake, oriented x 3; denies chest pain, orthopnea, fever, nausea, vomiting or palpitations.  Good saturation on room air. Respiratory system:  Improved air movement bilaterally; no using accessory muscle.  Good saturation on room air.  No wheezing or crackles appreciated. Cardiovascular system:RRR. No rubs or gallops; no JVD. Gastrointestinal system: Abdomen is obese, nondistended, soft and nontender. No organomegaly or masses felt. Normal bowel sounds heard. Central nervous system: Alert and oriented. No focal neurological deficits. Extremities: No cyanosis, clubbing or edema. Skin: No petechiae. Psychiatry: Judgement and insight appear normal. Mood & affect appropriate.   Data Reviewed: A1c: 5.8 TSH: 1.69 Lipid panel: Total cholesterol 209, LDL 81, HDL 99 and triglycerides 147. Basic metabolic panel: Sodium 638, potassium 3.5, chloride 100, bicarb 26, BUN 14, creatinine 1.27  Family Communication: No family is at bedside on today's evaluation.  Disposition: Status is: Inpatient Remains inpatient appropriate because: Continue diuresis and further evaluation/management by cardiology service.  Patient will be transferred to [facility] for right and left heart cath and further adjustment to his heart failure medications.   Planned Discharge Destination: Home  Time spent: 35 minutes  Author: Barton Dubois, MD 01/03/2023 2:21 PM  For on call review [email or web address].

## 2023-01-03 NOTE — Interval H&P Note (Signed)
History and Physical Interval Note:  01/03/2023 2:36 PM  Elijah Perez.  has presented today for surgery, with the diagnosis of hf.  The various methods of treatment have been discussed with the patient and family. After consideration of risks, benefits and other options for treatment, the patient has consented to  Procedure(s): RIGHT/LEFT HEART CATH AND CORONARY ANGIOGRAPHY (N/A) as a surgical intervention.  The patient's history has been reviewed, patient examined, no change in status, stable for surgery.  I have reviewed the patient's chart and labs.  Questions were answered to the patient's satisfaction.    Cath Lab Visit (complete for each Cath Lab visit)  Clinical Evaluation Leading to the Procedure:   ACS: Yes.    Non-ACS:    Anginal Classification: CCS II  Anti-ischemic medical therapy: No Therapy  Non-Invasive Test Results: No non-invasive testing performed  Prior CABG: No previous CABG        Lauree Chandler

## 2023-01-03 NOTE — Progress Notes (Signed)
Pt arrived from [facility] via [place].  Arrived A &O x4.  Pt connected for vitals.  Waiting comfortably for cath.

## 2023-01-03 NOTE — Progress Notes (Signed)
TR BAND REMOVAL  LOCATION:    Right radial  DEFLATED PER PROTOCOL:    Yes.    TIME BAND OFF / DRESSING APPLIED:    1800p a clean dry dressing applied with gauze and tegaderm    SITE UPON ARRIVAL:    Level 0  SITE AFTER BAND REMOVAL:    Level 0  CIRCULATION SENSATION AND MOVEMENT:    Within Normal Limits   Yes.    COMMENTS:   Care instructions given  to patient.

## 2023-01-03 NOTE — Progress Notes (Addendum)
Rounding Note    Patient Name: Elijah Perez. Date of Encounter: 01/03/2023  [facility] Cardiologist: New, Dr Carlyle Dolly  Subjective   SOB resolved  Inpatient Medications    Scheduled Meds:  allopurinol  100 mg Oral Daily   enoxaparin (LOVENOX) injection  50 mg Subcutaneous Daily   famotidine  20 mg Oral BID   furosemide  40 mg Intravenous Q12H   levothyroxine  224 mcg Oral Daily   losartan  12.5 mg Oral Daily   Continuous Infusions:  PRN Meds: acetaminophen **OR** acetaminophen, ondansetron **OR** ondansetron (ZOFRAN) IV, polyethylene glycol   Vital Signs    Vitals:   01/02/23 2120 01/02/23 2350 01/03/23 0405 01/03/23 0633  BP: (!) 130/99   (!) 115/90  Pulse:  67 69   Resp:  13 15   Temp: 97.7 F (36.5 C)   97.7 F (36.5 C)  TempSrc: Oral     SpO2:  98% 98%   Weight:    103.6 kg  Height:        Intake/Output Summary (Last 24 hours) at 01/03/2023 0811 Last data filed at 01/03/2023 0413 Gross per 24 hour  Intake 120 ml  Output 3750 ml  Net -3630 ml      01/03/2023    6:33 AM 01/02/2023    5:04 AM 01/01/2023    1:09 AM  Last 3 Weights  Weight (lbs) 228 lb 8 oz 231 lb 11.2 oz 235 lb  Weight (kg) 103.647 kg 105.098 kg 106.595 kg      Telemetry    SR - Personally Reviewed  ECG    N/a - Personally Reviewed  Physical Exam   GEN: No acute distress.   Neck: No JVD Cardiac: RRR, no murmurs, rubs, or gallops.  Respiratory: faint crackles bilateral bases GI: Soft, nontender, non-distended  MS: No edema; No deformity. Neuro:  Nonfocal  Psych: Normal affect   Labs    High Sensitivity Troponin:   Recent Labs  Lab 01/01/23 0227 01/01/23 0344  TROPONINIHS 35* 34*     Chemistry Recent Labs  Lab 01/01/23 0227 01/01/23 1057 01/02/23 0427 01/03/23 0411  NA 136  --  138 137  K 4.0  --  3.5 3.5  CL 105  --  102 100  CO2 24  --  26 26  GLUCOSE 112*  --  103* 105*  BUN 13  --  14 14  CREATININE 1.18  --  1.18 1.27*   CALCIUM 8.9  --  9.1 9.2  MG  --  1.8 1.9  --   PROT  --   --  6.8  --   ALBUMIN  --   --  3.7  --   AST  --   --  22  --   ALT  --   --  22  --   ALKPHOS  --   --  52  --   BILITOT  --   --  0.9  --   GFRNONAA >60  --  >60 >60  ANIONGAP 7  --  10 11    Lipids  Recent Labs  Lab 01/03/23 0411  CHOL 209*  TRIG 147  HDL 99  LDLCALC 81  CHOLHDL 2.1    Hematology Recent Labs  Lab 01/01/23 0227 01/02/23 0427  WBC 5.4 4.9  RBC 5.63 5.99*  HGB 13.1 14.2  HCT 42.2 44.4  MCV 75.0* 74.1*  MCH 23.3* 23.7*  MCHC 31.0 32.0  RDW 14.7  14.7  PLT 164 198   Thyroid  Recent Labs  Lab 01/03/23 0411  TSH 1.629    BNP Recent Labs  Lab 01/01/23 0227  BNP 764.0*    DDimer  Recent Labs  Lab 01/01/23 0227  DDIMER 0.68*     Radiology    ECHOCARDIOGRAM COMPLETE  Result Date: 01/01/2023    ECHOCARDIOGRAM REPORT   Patient Name:   Elijah Perez. Date of Exam: 01/01/2023 Medical Rec #:  [id number]          Height:       71.0 in Accession #:    [id number]         Weight:       235.0 lb Date of Birth:  [birth year]         BSA:          2.258 m Patient Age:    57 years           BP:           125/98 mmHg Patient Gender: M                  HR:           83 bpm. Exam Location:  [facility] Procedure: 2D Echo, Cardiac Doppler, Color Doppler and Intracardiac            Opacification Agent Indications:    CHF  History:        Patient has no prior history of Echocardiogram examinations.                 CHF.  Sonographer:    Wenda Low Referring Phys: [id number] OLADAPO ADEFESO IMPRESSIONS  1. Left ventricular ejection fraction, by estimation, is 30 to 35%. The left ventricle has moderately decreased function. The left ventricle demonstrates global hypokinesis. The left ventricular internal cavity size was mildly dilated. Left ventricular diastolic parameters are indeterminate.  2. Right ventricular systolic function is normal. The right ventricular size is normal.  3. Left atrial size was  severely dilated.  4. Right atrial size was mildly dilated.  5. The mitral valve is abnormal. Mild mitral valve regurgitation. No evidence of mitral stenosis.  6. The aortic valve is tricuspid. There is mild calcification of the aortic valve. There is mild thickening of the aortic valve. Aortic valve regurgitation is mild. No aortic stenosis is present.  7. The inferior vena cava is normal in size with greater than 50% respiratory variability, suggesting right atrial pressure of 3 mmHg. FINDINGS  Left Ventricle: Left ventricular ejection fraction, by estimation, is 30 to 35%. The left ventricle has moderately decreased function. The left ventricle demonstrates global hypokinesis. Definity contrast agent was given IV to delineate the left ventricular endocardial borders. The left ventricular internal cavity size was mildly dilated. There is no left ventricular hypertrophy. Left ventricular diastolic parameters are indeterminate. Right Ventricle: The right ventricular size is normal. Right vetricular wall thickness was not well visualized. Right ventricular systolic function is normal. Left Atrium: Left atrial size was severely dilated. Right Atrium: Right atrial size was mildly dilated. Pericardium: There is no evidence of pericardial effusion. Mitral Valve: The mitral valve is abnormal. Mild mitral valve regurgitation. No evidence of mitral valve stenosis. MV peak gradient, 4.5 mmHg. The mean mitral valve gradient is 2.0 mmHg. Tricuspid Valve: The tricuspid valve is normal in structure. Tricuspid valve regurgitation is not demonstrated. No evidence of tricuspid stenosis. Aortic Valve: The aortic valve is tricuspid. There is  mild calcification of the aortic valve. There is mild thickening of the aortic valve. There is mild aortic valve annular calcification. Aortic valve regurgitation is mild. No aortic stenosis is present. Aortic valve mean gradient measures 3.0 mmHg. Aortic valve peak gradient measures 4.6 mmHg.  Aortic valve area, by VTI measures 2.51 cm. Pulmonic Valve: The pulmonic valve was not well visualized. Pulmonic valve regurgitation is mild. No evidence of pulmonic stenosis. Aorta: The aortic root is normal in size and structure. Venous: The inferior vena cava is normal in size with greater than 50% respiratory variability, suggesting right atrial pressure of 3 mmHg. IAS/Shunts: No atrial level shunt detected by color flow Doppler.  LEFT VENTRICLE PLAX 2D LVIDd:         6.30 cm   Diastology LVIDs:         4.90 cm   LV e' medial:    7.50 cm/s LV PW:         1.10 cm   LV E/e' medial:  10.3 LV IVS:        1.10 cm   LV e' lateral:   11.30 cm/s LVOT diam:     2.20 cm   LV E/e' lateral: 6.9 LV SV:         55 LV SV Index:   24 LVOT Area:     3.80 cm  RIGHT VENTRICLE RV Basal diam:  3.70 cm RV Mid diam:    2.70 cm RV S prime:     11.70 cm/s TAPSE (M-mode): 2.2 cm LEFT ATRIUM              Index        RIGHT ATRIUM           Index LA diam:        5.70 cm  2.52 cm/m   RA Area:     20.90 cm LA Vol (A2C):   138.0 ml 61.11 ml/m  RA Volume:   57.80 ml  25.60 ml/m LA Vol (A4C):   109.0 ml 48.27 ml/m LA Biplane Vol: 130.0 ml 57.57 ml/m  AORTIC VALVE                    PULMONIC VALVE AV Area (Vmax):    2.81 cm     PV Vmax:       0.78 m/s AV Area (Vmean):   2.68 cm     PV Peak grad:  2.4 mmHg AV Area (VTI):     2.51 cm AV Vmax:           107.00 cm/s AV Vmean:          82.400 cm/s AV VTI:            0.220 m AV Peak Grad:      4.6 mmHg AV Mean Grad:      3.0 mmHg LVOT Vmax:         79.00 cm/s LVOT Vmean:        58.200 cm/s LVOT VTI:          0.145 m LVOT/AV VTI ratio: 0.66  AORTA Ao Root diam: 3.30 cm MITRAL VALVE MV Area (PHT): 4.52 cm    SHUNTS MV Area VTI:   2.74 cm    Systemic VTI:  0.14 m MV Peak grad:  4.5 mmHg    Systemic Diam: 2.20 cm MV Mean grad:  2.0 mmHg MV Vmax:       1.06 m/s MV Vmean:  57.8 cm/s MV Decel Time: 168 msec MV E velocity: 77.60 cm/s Carlyle Dolly MD Electronically signed by Carlyle Dolly  MD Signature Date/Time: 01/01/2023/2:27:08 PM    Final     Cardiac Studies     Patient Profile     Elijah Pulling. is a 57 y.o. male with a hx of thyroid cancer, s/p thyroidectomy in 2017, GERD who is being seen 01/02/2023 for the evaluation of new onset heart failure at the request of Dr. Dyann Kief.   Assessment & Plan    1.Acute HFrEF - new onset  HF, echo LVEF 30-35% - BNP 764, pulm edema on CXR and CT - on IV lasix '40mg'$  bid, neg 3.6 L yesterday and 8.1 L since admission. Mild uptrend in Cr, hold further lasix today. F/u filling pressures from cath.  - reds vest yesterday afternoon 45%.    - unclear etiology. No FH of HF, no drug use. Pretty heavy EtOH use on the weekends only nothing during the week. No recent viral illnesses. No known history of coronary disease. Plan for RHC/LHC   - start toprol 12.'5mg'$  daily. Follow bp's on losartan started yesterday, perhaps transition to entresto later in admit. Start SGLT2i today with farxiga '10mg'$  daily, likely MRA tomorrow.    - plan for RHC/LHC today. We will transfer to cardiology service at [facility]   Shared Decision Making/Informed Consent The risks [stroke (1 in 1000), death (1 in 63), kidney failure [usually temporary] (1 in 500), bleeding (1 in 200), allergic reaction [possibly serious] (1 in 200)], benefits (diagnostic support and management of coronary artery disease) and alternatives of a cardiac catheterization were discussed in detail with Elijah Perez and he is willing to proceed.   For questions or updates, please contact [place] Please consult [email or web address] for contact info under        Signed, Carlyle Dolly, MD  01/03/2023, 8:11 AM

## 2023-01-03 NOTE — Progress Notes (Signed)
Pt did not rest well overnight. He remained in NSR on monitor with frequent PVC's. Spo2 remained in upper 90's most of the night and did not require oxygen supplementation. He remained NPO after midnight due to anticipated cardiac cath pending cardiology review this AM. FYI Pt would like to have twice daily Pepcid changed to Omeprazole. No acute events overnight. Bryson Corona Edd Fabian

## 2023-01-03 NOTE — Progress Notes (Signed)
Patient mentioned to RN that he is taking omeprazole 20 mg OD instead of Pepcid. Notified Vickki Muff, MD via paged.

## 2023-01-03 NOTE — H&P (View-Only) (Signed)
Rounding Note    Patient Name: Elijah Perez. Date of Encounter: 01/03/2023  [facility] Cardiologist: New, Dr Carlyle Dolly  Subjective   SOB resolved  Inpatient Medications    Scheduled Meds:  allopurinol  100 mg Oral Daily   enoxaparin (LOVENOX) injection  50 mg Subcutaneous Daily   famotidine  20 mg Oral BID   furosemide  40 mg Intravenous Q12H   levothyroxine  224 mcg Oral Daily   losartan  12.5 mg Oral Daily   Continuous Infusions:  PRN Meds: acetaminophen **OR** acetaminophen, ondansetron **OR** ondansetron (ZOFRAN) IV, polyethylene glycol   Vital Signs    Vitals:   01/02/23 2120 01/02/23 2350 01/03/23 0405 01/03/23 0633  BP: (!) 130/99   (!) 115/90  Pulse:  67 69   Resp:  13 15   Temp: 97.7 F (36.5 C)   97.7 F (36.5 C)  TempSrc: Oral     SpO2:  98% 98%   Weight:    103.6 kg  Height:        Intake/Output Summary (Last 24 hours) at 01/03/2023 0811 Last data filed at 01/03/2023 0413 Gross per 24 hour  Intake 120 ml  Output 3750 ml  Net -3630 ml      01/03/2023    6:33 AM 01/02/2023    5:04 AM 01/01/2023    1:09 AM  Last 3 Weights  Weight (lbs) 228 lb 8 oz 231 lb 11.2 oz 235 lb  Weight (kg) 103.647 kg 105.098 kg 106.595 kg      Telemetry    SR - Personally Reviewed  ECG    N/a - Personally Reviewed  Physical Exam   GEN: No acute distress.   Neck: No JVD Cardiac: RRR, no murmurs, rubs, or gallops.  Respiratory: faint crackles bilateral bases GI: Soft, nontender, non-distended  MS: No edema; No deformity. Neuro:  Nonfocal  Psych: Normal affect   Labs    High Sensitivity Troponin:   Recent Labs  Lab 01/01/23 0227 01/01/23 0344  TROPONINIHS 35* 34*     Chemistry Recent Labs  Lab 01/01/23 0227 01/01/23 1057 01/02/23 0427 01/03/23 0411  NA 136  --  138 137  K 4.0  --  3.5 3.5  CL 105  --  102 100  CO2 24  --  26 26  GLUCOSE 112*  --  103* 105*  BUN 13  --  14 14  CREATININE 1.18  --  1.18 1.27*   CALCIUM 8.9  --  9.1 9.2  MG  --  1.8 1.9  --   PROT  --   --  6.8  --   ALBUMIN  --   --  3.7  --   AST  --   --  22  --   ALT  --   --  22  --   ALKPHOS  --   --  52  --   BILITOT  --   --  0.9  --   GFRNONAA >60  --  >60 >60  ANIONGAP 7  --  10 11    Lipids  Recent Labs  Lab 01/03/23 0411  CHOL 209*  TRIG 147  HDL 99  LDLCALC 81  CHOLHDL 2.1    Hematology Recent Labs  Lab 01/01/23 0227 01/02/23 0427  WBC 5.4 4.9  RBC 5.63 5.99*  HGB 13.1 14.2  HCT 42.2 44.4  MCV 75.0* 74.1*  MCH 23.3* 23.7*  MCHC 31.0 32.0  RDW 14.7  14.7  PLT 164 198   Thyroid  Recent Labs  Lab 01/03/23 0411  TSH 1.629    BNP Recent Labs  Lab 01/01/23 0227  BNP 764.0*    DDimer  Recent Labs  Lab 01/01/23 0227  DDIMER 0.68*     Radiology    ECHOCARDIOGRAM COMPLETE  Result Date: 01/01/2023    ECHOCARDIOGRAM REPORT   Patient Name:   Aedan Geimer. Date of Exam: 01/01/2023 Medical Rec #:  [id number]          Height:       71.0 in Accession #:    [id number]         Weight:       235.0 lb Date of Birth:  12/01/1966         BSA:          2.258 m Patient Age:    78 years           BP:           125/98 mmHg Patient Gender: M                  HR:           83 bpm. Exam Location:  [facility] Procedure: 2D Echo, Cardiac Doppler, Color Doppler and Intracardiac            Opacification Agent Indications:    CHF  History:        Patient has no prior history of Echocardiogram examinations.                 CHF.  Sonographer:    Wenda Low Referring Phys: [id number] OLADAPO ADEFESO IMPRESSIONS  1. Left ventricular ejection fraction, by estimation, is 30 to 35%. The left ventricle has moderately decreased function. The left ventricle demonstrates global hypokinesis. The left ventricular internal cavity size was mildly dilated. Left ventricular diastolic parameters are indeterminate.  2. Right ventricular systolic function is normal. The right ventricular size is normal.  3. Left atrial size was  severely dilated.  4. Right atrial size was mildly dilated.  5. The mitral valve is abnormal. Mild mitral valve regurgitation. No evidence of mitral stenosis.  6. The aortic valve is tricuspid. There is mild calcification of the aortic valve. There is mild thickening of the aortic valve. Aortic valve regurgitation is mild. No aortic stenosis is present.  7. The inferior vena cava is normal in size with greater than 50% respiratory variability, suggesting right atrial pressure of 3 mmHg. FINDINGS  Left Ventricle: Left ventricular ejection fraction, by estimation, is 30 to 35%. The left ventricle has moderately decreased function. The left ventricle demonstrates global hypokinesis. Definity contrast agent was given IV to delineate the left ventricular endocardial borders. The left ventricular internal cavity size was mildly dilated. There is no left ventricular hypertrophy. Left ventricular diastolic parameters are indeterminate. Right Ventricle: The right ventricular size is normal. Right vetricular wall thickness was not well visualized. Right ventricular systolic function is normal. Left Atrium: Left atrial size was severely dilated. Right Atrium: Right atrial size was mildly dilated. Pericardium: There is no evidence of pericardial effusion. Mitral Valve: The mitral valve is abnormal. Mild mitral valve regurgitation. No evidence of mitral valve stenosis. MV peak gradient, 4.5 mmHg. The mean mitral valve gradient is 2.0 mmHg. Tricuspid Valve: The tricuspid valve is normal in structure. Tricuspid valve regurgitation is not demonstrated. No evidence of tricuspid stenosis. Aortic Valve: The aortic valve is tricuspid. There is  mild calcification of the aortic valve. There is mild thickening of the aortic valve. There is mild aortic valve annular calcification. Aortic valve regurgitation is mild. No aortic stenosis is present. Aortic valve mean gradient measures 3.0 mmHg. Aortic valve peak gradient measures 4.6 mmHg.  Aortic valve area, by VTI measures 2.51 cm. Pulmonic Valve: The pulmonic valve was not well visualized. Pulmonic valve regurgitation is mild. No evidence of pulmonic stenosis. Aorta: The aortic root is normal in size and structure. Venous: The inferior vena cava is normal in size with greater than 50% respiratory variability, suggesting right atrial pressure of 3 mmHg. IAS/Shunts: No atrial level shunt detected by color flow Doppler.  LEFT VENTRICLE PLAX 2D LVIDd:         6.30 cm   Diastology LVIDs:         4.90 cm   LV e' medial:    7.50 cm/s LV PW:         1.10 cm   LV E/e' medial:  10.3 LV IVS:        1.10 cm   LV e' lateral:   11.30 cm/s LVOT diam:     2.20 cm   LV E/e' lateral: 6.9 LV SV:         55 LV SV Index:   24 LVOT Area:     3.80 cm  RIGHT VENTRICLE RV Basal diam:  3.70 cm RV Mid diam:    2.70 cm RV S prime:     11.70 cm/s TAPSE (M-mode): 2.2 cm LEFT ATRIUM              Index        RIGHT ATRIUM           Index LA diam:        5.70 cm  2.52 cm/m   RA Area:     20.90 cm LA Vol (A2C):   138.0 ml 61.11 ml/m  RA Volume:   57.80 ml  25.60 ml/m LA Vol (A4C):   109.0 ml 48.27 ml/m LA Biplane Vol: 130.0 ml 57.57 ml/m  AORTIC VALVE                    PULMONIC VALVE AV Area (Vmax):    2.81 cm     PV Vmax:       0.78 m/s AV Area (Vmean):   2.68 cm     PV Peak grad:  2.4 mmHg AV Area (VTI):     2.51 cm AV Vmax:           107.00 cm/s AV Vmean:          82.400 cm/s AV VTI:            0.220 m AV Peak Grad:      4.6 mmHg AV Mean Grad:      3.0 mmHg LVOT Vmax:         79.00 cm/s LVOT Vmean:        58.200 cm/s LVOT VTI:          0.145 m LVOT/AV VTI ratio: 0.66  AORTA Ao Root diam: 3.30 cm MITRAL VALVE MV Area (PHT): 4.52 cm    SHUNTS MV Area VTI:   2.74 cm    Systemic VTI:  0.14 m MV Peak grad:  4.5 mmHg    Systemic Diam: 2.20 cm MV Mean grad:  2.0 mmHg MV Vmax:       1.06 m/s MV Vmean:  57.8 cm/s MV Decel Time: 168 msec MV E velocity: 77.60 cm/s Carlyle Dolly MD Electronically signed by Carlyle Dolly  MD Signature Date/Time: 01/01/2023/2:27:08 PM    Final     Cardiac Studies     Patient Profile     [name]. is a 57 y.o. male with a hx of thyroid cancer, s/p thyroidectomy in 2017, GERD who is being seen 01/02/2023 for the evaluation of new onset heart failure at the request of Dr. Dyann Kief.   Assessment & Plan    1.Acute HFrEF - new onset  HF, echo LVEF 30-35% - BNP 764, pulm edema on CXR and CT - on IV lasix '40mg'$  bid, neg 3.6 L yesterday and 8.1 L since admission. Mild uptrend in Cr, hold further lasix today. F/u filling pressures from cath.  - reds vest yesterday afternoon 45%.    - unclear etiology. No FH of HF, no drug use. Pretty heavy EtOH use on the weekends only nothing during the week. No recent viral illnesses. No known history of coronary disease. Plan for RHC/LHC   - start toprol 12.'5mg'$  daily. Follow bp's on losartan started yesterday, perhaps transition to entresto later in admit. Start SGLT2i today with farxiga '10mg'$  daily, likely MRA tomorrow.    - plan for RHC/LHC today. We will transfer [facility] cardiology service at [facility]   Shared Decision Making/Informed Consent The risks [stroke (1 in 1000), death (1 in 20), kidney failure [usually temporary] (1 in 500), bleeding (1 in 200), allergic reaction [possibly serious] (1 in 200)], benefits (diagnostic support and management of coronary artery disease) and alternatives of a cardiac catheterization were discussed in detail with Mr. Rockett and he is willing to proceed.   For questions or updates, please contact [place] Please consult [email or web address] for contact info under        Signed, Carlyle Dolly, MD  01/03/2023, 8:11 AM

## 2023-01-03 NOTE — Progress Notes (Signed)
[facility] here pick patient up for transfer to [facility] for cardiac catheterization. Patient given all belongings, and is aware of pending procedure.

## 2023-01-04 ENCOUNTER — Encounter (HOSPITAL_COMMUNITY): Payer: Self-pay | Admitting: Cardiovascular Disease

## 2023-01-04 ENCOUNTER — Other Ambulatory Visit (HOSPITAL_COMMUNITY): Payer: Self-pay

## 2023-01-04 DIAGNOSIS — N179 Acute kidney failure, unspecified: Secondary | ICD-10-CM | POA: Insufficient documentation

## 2023-01-04 DIAGNOSIS — I5021 Acute systolic (congestive) heart failure: Secondary | ICD-10-CM | POA: Diagnosis not present

## 2023-01-04 DIAGNOSIS — I428 Other cardiomyopathies: Secondary | ICD-10-CM | POA: Diagnosis not present

## 2023-01-04 LAB — CBC
HCT: 45.1 % (ref 39.0–52.0)
Hemoglobin: 14.6 g/dL (ref 13.0–17.0)
MCH: 23.7 pg — ABNORMAL LOW (ref 26.0–34.0)
MCHC: 32.4 g/dL (ref 30.0–36.0)
MCV: 73.2 fL — ABNORMAL LOW (ref 80.0–100.0)
Platelets: 211 10*3/uL (ref 150–400)
RBC: 6.16 MIL/uL — ABNORMAL HIGH (ref 4.22–5.81)
RDW: 14.4 % (ref 11.5–15.5)
WBC: 5.4 10*3/uL (ref 4.0–10.5)
nRBC: 0 % (ref 0.0–0.2)

## 2023-01-04 LAB — BASIC METABOLIC PANEL
Anion gap: 8 (ref 5–15)
BUN: 14 mg/dL (ref 6–20)
CO2: 27 mmol/L (ref 22–32)
Calcium: 9.4 mg/dL (ref 8.9–10.3)
Chloride: 102 mmol/L (ref 98–111)
Creatinine, Ser: 1.4 mg/dL — ABNORMAL HIGH (ref 0.61–1.24)
GFR, Estimated: 59 mL/min — ABNORMAL LOW (ref >60)
Glucose, Bld: 97 mg/dL (ref 70–99)
Potassium: 3.8 mmol/L (ref 3.5–5.1)
Sodium: 137 mmol/L (ref 135–145)

## 2023-01-04 MED ORDER — LOSARTAN POTASSIUM 25 MG PO TABS
12.5000 mg | ORAL_TABLET | Freq: Every day | ORAL | 2 refills | Status: DC
  Filled 2023-01-04: qty 15, 30d supply, fill #0
  Filled 2023-01-28: qty 15, 30d supply, fill #1

## 2023-01-04 MED ORDER — DAPAGLIFLOZIN PROPANEDIOL 10 MG PO TABS
10.0000 mg | ORAL_TABLET | Freq: Every day | ORAL | 2 refills | Status: DC
  Filled 2023-01-04: qty 30, 30d supply, fill #0
  Filled 2023-01-28 – 2023-01-29 (×2): qty 30, 30d supply, fill #1

## 2023-01-04 MED ORDER — FUROSEMIDE 40 MG PO TABS
40.0000 mg | ORAL_TABLET | Freq: Every day | ORAL | 1 refills | Status: DC | PRN
  Filled 2023-01-04: qty 30, 30d supply, fill #0

## 2023-01-04 MED ORDER — METOPROLOL SUCCINATE ER 25 MG PO TB24
12.5000 mg | ORAL_TABLET | Freq: Every day | ORAL | 2 refills | Status: DC
  Filled 2023-01-04: qty 15, 30d supply, fill #0
  Filled 2023-01-28: qty 15, 30d supply, fill #1

## 2023-01-04 MED ORDER — POTASSIUM CHLORIDE CRYS ER 20 MEQ PO TBCR
20.0000 meq | EXTENDED_RELEASE_TABLET | Freq: Every day | ORAL | 1 refills | Status: DC | PRN
  Filled 2023-01-04: qty 30, 30d supply, fill #0

## 2023-01-04 NOTE — Discharge Instructions (Signed)
Heart Failure Education: Weigh yourself EVERY morning after you go to the bathroom but before you eat or drink anything. Write this number down in a weight log/diary. If you gain 3 pounds overnight or 5 pounds in a week, call the office. Take your medicines as prescribed. If you have concerns about your medications, please call us before you stop taking them.  Eat low salt foods--Limit salt (sodium) to 2000 mg per day. This will help prevent your body from holding onto fluid. Read food labels as many processed foods have a lot of sodium, especially canned goods and prepackaged meats. If you would like some assistance choosing low sodium foods, we would be happy to set you up with a nutritionist. Limit all fluids for the day to less than 2 liters (64 ounces). Fluid includes all drinks, coffee, juice, ice chips, soup, jello, and all other liquids. Stay as active as you can everyday. Staying active will give you more energy and make your muscles stronger. Start with 5 minutes at a time and work your way up to 30 minutes a day. Break up your activities--do some in the morning and some in the afternoon. Start with 3 days per week and work your way up to 5 days as you can.  If you have chest pain, feel short of breath, dizzy, or lightheaded, STOP. If you don't feel better after a short rest, call 911. If you do feel better, call the office to let us know you have symptoms with exercise.

## 2023-01-04 NOTE — Progress Notes (Signed)
Discharge instructions reviewed with pt and his daughter.  Copy of instructions given to pt. [facility] filled pt's scripts and meds were delivered to pt in his room.  Pt's wife is picking pt up and will be here in 5-10 minutes at the main entrance. Pt will call the unit nursing station when wife is at the main entrance and staff will take pt out for discharge via wheelchair.  Pt has his belongings packed, daughter at his bedside.

## 2023-01-04 NOTE — Progress Notes (Signed)
Heart Failure Nurse Navigator Progress Note  PCP: [facility] PCP-Cardiologist: Dr. Harl Bowie Admission Diagnosis: New onset of congestive heart failure, Acute respiratory failure with hypoxia Admitted from: [facility]  Presentation:   Elijah Perez. presented with shortness of breath,6 week non-productive cough,  then unable to lay flat. Had a dry cough and some chest soreness. BP 128/94, HR 83, BNP 764 and troponin's mildly elevated, CTA negative for PE, showed pulmonary edema and small pleural effusion. IV lasix given, R / L heart cath 1/18 with no angiographic evidence of CAD, normal right and left heart pressures. Recommendations for medical management of non-ischemic cardiomyopathy.   Patient/ wife/ and daughter were educated on the sign and symptoms of heart failure, daily weights, when to call his doctor or go to the ED. Diet/ fluid restrictions, taking all medications as prescribed and attending all medical appointments, patient and family verbalized their understanding of education, a HF TOC follow up appointment was scheduled for 01/11/2023 @ 11 am.   ECHO/ LVEF: 30-35% new  Clinical Course:  Past Medical History:  Diagnosis Date   Cancer ([place])    Horner's syndrome      Social History   Socioeconomic History   Marital status: Married    Spouse name: Not on file   Number of children: Not on file   Years of education: Not on file   Highest education level: Not on file  Occupational History   Not on file  Tobacco Use   Smoking status: Former    Types: Cigarettes    Quit date: 1996    Years since quitting: 28.0   Smokeless tobacco: Never  Vaping Use   Vaping Use: Never used  Substance and Sexual Activity   Alcohol use: Not Currently    Alcohol/week: 2.0 standard drinks of alcohol    Types: 2 Glasses of wine per week   Drug use: Not Currently   Sexual activity: Not on file  Other Topics Concern   Not on file  Social History Narrative   Not  on file   Social Determinants of Health   Financial Resource Strain: Not on file  Food Insecurity: No Food Insecurity (01/01/2023)   Hunger Vital Sign    Worried About Running Out of Food in the Last Year: Never true    Ran Out of Food in the Last Year: Never true  Transportation Needs: No Transportation Needs (01/01/2023)   PRAPARE - Hydrologist (Medical): No    Lack of Transportation (Non-Medical): No  Physical Activity: Not on file  Stress: Not on file  Social Connections: Not on file   Education Assessment and Provision:  Detailed education and instructions provided on heart failure disease management including the following:  Signs and symptoms of Heart Failure When to call the physician Importance of daily weights Low sodium diet Fluid restriction Medication management Anticipated future follow-up appointments  Patient education given on each of the above topics.  Patient acknowledges understanding via teach back method and acceptance of all instructions.  Education Materials:  "Living Better With Heart Failure" Booklet, HF zone tool, & Daily Weight Tracker Tool.  Patient has scale at home: Yes Patient has pill box at home: NA    High Risk Criteria for Readmission and/or Poor Patient Outcomes: Heart failure hospital admissions (last 6 months): 1  No Show rate: 0 Difficult social situation: No Demonstrates medication adherence: Yes Primary Language: English Literacy level: Reading, writing, comprehension  Barriers of Care:   New HF, continue education Diet/ fluids/ daily weights  Considerations/Referrals:   Referral made to Heart Failure Pharmacist Stewardship: yes Referral made to Heart Failure CSW/NCM TOC: No Referral made to Heart & Vascular TOC clinic: Yes. 01/11/2023  Items for Follow-up on DC/TOC: New HF, continued education Diet/ fluids/ daily weights   Earnestine Leys, BSN, RN Heart Failure Transport planner  Only

## 2023-01-04 NOTE — TOC Benefit Eligibility Note (Signed)
Patient Teacher, English as a foreign language completed.    The patient is currently admitted and upon discharge could be taking Entresto 24-26 mg.  The current 30 day co-pay is $90.00.   The patient is currently admitted and upon discharge could be taking Farxiga 10 mg.  The current 30 day co-pay is $90.00.   The patient is currently admitted and upon discharge could be taking Jardiance 10 mg.  The current 30 day co-pay is $90.00.   The patient is insured through [place], [place] Patient Advocate Specialist [place] Patient Advocate Team Direct Number: [phone]  Fax: [phone]

## 2023-01-04 NOTE — Discharge Summary (Addendum)
Discharge Summary    Patient ID: Elijah Perez. MRN: [id number]; DOB: [birth year]  Admit date: 01/01/2023 Discharge date: 01/04/2023  PCP:  Cl[facility], [place] [facility] Cardiologist:  Dr. Carlyle Dolly  Discharge Diagnoses    Principal Problem:   Acute systolic heart failure [facility]) Active Problems:   Non-ischemic cardiomyopathy ([place])   Demand ischemia   AKI (acute kidney injury) ([place])   GERD (gastroesophageal reflux disease)   Acquired hypothyroidism   Obesity (BMI 30-39.9)    Diagnostic Studies/Procedures    Echocardiogram 01/01/2023: Impressions: 1. Left ventricular ejection fraction, by estimation, is 30 to 35%. The  left ventricle has moderately decreased function. The left ventricle  demonstrates global hypokinesis. The left ventricular internal cavity size  was mildly dilated. Left ventricular  diastolic parameters are indeterminate.   2. Right ventricular systolic function is normal. The right ventricular  size is normal.   3. Left atrial size was severely dilated.   4. Right atrial size was mildly dilated.   5. The mitral valve is abnormal. Mild mitral valve regurgitation. No  evidence of mitral stenosis.   6. The aortic valve is tricuspid. There is mild calcification of the  aortic valve. There is mild thickening of the aortic valve. Aortic valve  regurgitation is mild. No aortic stenosis is present.   7. The inferior vena cava is normal in size with greater than 50%  respiratory variability, suggesting right atrial pressure of 3 mmHg.   _____________  Right/ Left Cardiac Catheterization 01/03/2023: No angiographic evidence of CAD Normal right and left heart pressures (RA 1, RV 22/0/2, PA 23/5 mean 12, PCWP 7, LV 102/3/9, AO 108/71, CO 4.97 L/min, CI 2.22)   Recommendations: Medical management of non-ischemic cardiomyopathy.   Diagnostic Dominance: Right     History of Present Illness     Elijah Difrancesco. is a  57 y.o. male with a history of thyroid cancer s/p thyroidectomy in 2017 and GERD who was admitted to [facility] on 01/01/2023 for new onset CHF after presenting with shortness of breath and 6-week non-productive cough. He denied any chest pain or edema.   Work-up in the ED: High-sensitivity troponin minimally elevated and flat at 35 >> 34. BNP elevated at 764. D-dimer elevated at 0.68. Chest x-ray showed evidence of pulmonary edema. Chest CTA was negative for PE but also showed pulmonary edema and small pleural effusions. Patient was started on IV Lasix and admitted. Echo showed LVEF of 30-35% with global hypokinesis, normal RV, severe left atrial enlargement, and mild MR. He was seen by Cardiology and transferred to [facility] for [facility] after he was adequately diuresed.   Hospital Course     Consultants: None   Acute HFrEF Non-Ischemic Cardiomyopathy Patient was admitted to [facility] on 01/01/2023 for new onset CHF. Echo showed LVEF of 30-35% with global hypokinesis, normal RV, severe left atrial enlargement, and mild MR. He diuresed abut 8 L with IV Lasix at [facility] and then transferred to [facility] on 01/03/2023 for [facility]. Cath showed normal coronaries with normal right and left heart pressures. He was added on GDMT. GDMT limited some by soft BP and renal function. Will be discharged on Lasix '40mg'$  as needed for weight gain and edema (will prescribed KCl 20 mEq to take as needed with the Lasix) as well as Losartan 12.'5mg'$  daily, Toprol-XL '25mg'$  daily, and Faxiga '10mg'$  daily. Can consider switching to [facility] or adding Spironolactone at outpatient follow-up  if BP and renal function allow. Will need repeat BMET at follow-up visit. Discussed importance of daily weights and sodium/fluid restrictions at home.  Etiology of cardiomyopathy unclear. He denies any drug use and has no family history of CHF. TSH and HIV normal. He did admit to drinking scotch on the weekend. Discussed importance  of drinking in moderation.  Demand Ischemia High-sensitivity troponin minimally elevated and flat at 35 >> 24. R/LHC showed clean coronaries. Troponin elevation consistent with demand ischemia secondary to acute CHF.  AKI Baseline creatinine around 1.1. Creatinine has been slowly increasing the last couple of days and is 1.4 on day of discharge. Will discharge patient on PRN Lasix and GDMT as above. Will need repeat BMET at follow-up visit next week.  Acquired Hypothyroidism Thyroid Cancer s/p Thyroidectomy in 2017 TSH normal this admission. Continue home Synthroid.  Patient seen and examined by Dr. Irish Lack and determined to be stable for discharge. Outpatient follow-up arranged - has appointments with both [facility] and General Cardiology scheduled. Medications as below.  Did the patient have an acute coronary syndrome (MI, NSTEMI, STEMI, etc) this admission?:  No.   The elevated Troponin was due to the acute medical illness (demand ischemia).   _____________  Discharge Vitals Blood pressure (!) 120/93, pulse 69, temperature (!) 97.5 F (36.4 C), temperature source Oral, resp. rate 18, height '5\' 11"'$  (1.803 m), weight 103.7 kg, SpO2 99 %.  Filed Weights   01/02/23 0504 01/03/23 0633 01/04/23 0538  Weight: 105.1 kg 103.6 kg 103.7 kg   General: 57 y.o. African-American male resting comfortably in no acute distress. HEENT: Normocephalic and atraumatic. Sclera clear.  Neck: Supple. No JVD. Heart: RRR. Distinct S1 and S2. No murmurs, gallops, or rubs. Right radial cath site soft with no signs of hematoma. Lungs: No increased work of breathing. Clear to ausculation bilaterally. No wheezes, rhonchi, or rales.  Abdomen: Soft, non-distended, and non-tender to palpation.  Extremities: No lower extremity edema.    Skin: Warm and dry. Neuro: Alert and oriented x3. No focal deficits. Psych: Normal affect. Responds appropriately.   Labs & Radiologic Studies    CBC Recent Labs     01/02/23 0427 01/03/23 1507 01/03/23 1512 01/04/23 0325  WBC 4.9  --   --  5.4  HGB 14.2   < > 16.0  15.0 14.6  HCT 44.4   < > 47.0  44.0 45.1  MCV 74.1*  --   --  73.2*  PLT 198  --   --  211   < > = values in this interval not displayed.   Basic Metabolic Panel Recent Labs    01/02/23 0427 01/03/23 0411 01/03/23 1507 01/03/23 1512 01/04/23 0325  NA 138 137   < > 138  142 137  K 3.5 3.5   < > 3.7  3.3* 3.8  CL 102 100  --   --  102  CO2 26 26  --   --  27  GLUCOSE 103* 105*  --   --  97  BUN 14 14  --   --  14  CREATININE 1.18 1.27*  --   --  1.40*  CALCIUM 9.1 9.2  --   --  9.4  MG 1.9  --   --   --   --    < > = values in this interval not displayed.   Liver Function Tests Recent Labs    01/02/23 0427  AST 22  ALT 22  ALKPHOS 52  BILITOT 0.9  PROT 6.8  ALBUMIN 3.7   No results for input(s): "LIPASE", "AMYLASE" in the last 72 hours. High Sensitivity Troponin:   Recent Labs  Lab 01/01/23 0227 01/01/23 0344  TROPONINIHS 35* 34*    BNP Invalid input(s): "POCBNP" D-Dimer No results for input(s): "DDIMER" in the last 72 hours. Hemoglobin A1C Recent Labs    01/02/23 1010  HGBA1C 5.8*   Fasting Lipid Panel Recent Labs    01/03/23 0411  CHOL 209*  HDL 99  LDLCALC 81  TRIG 147  CHOLHDL 2.1   Thyroid Function Tests Recent Labs    01/03/23 0411  TSH 1.629   _____________  CARDIAC CATHETERIZATION  Result Date: 01/03/2023 No angiographic evidence of CAD Normal right and left heart pressures (RA 1, RV 22/0/2, PA 23/5 mean 12, PCWP 7, LV 102/3/9, AO 108/71, CO 4.97 L/min, CI 2.22) Recommendations: Medical management of non-ischemic cardiomyopathy.   ECHOCARDIOGRAM COMPLETE  Result Date: 01/01/2023    ECHOCARDIOGRAM REPORT   Patient Name:   Elijah Perez. Date of Exam: 01/01/2023 Medical Rec #:  [id number]          Height:       71.0 in Accession #:    [id number]         Weight:       235.0 lb Date of Birth:  [birth year]         BSA:           2.258 m Patient Age:    68 years           BP:           125/98 mmHg Patient Gender: M                  HR:           83 bpm. Exam Location:  [facility] Procedure: 2D Echo, Cardiac Doppler, Color Doppler and Intracardiac            Opacification Agent Indications:    CHF  History:        Patient has no prior history of Echocardiogram examinations.                 CHF.  Sonographer:    Wenda Low Referring Phys: [id number] OLADAPO ADEFESO IMPRESSIONS  1. Left ventricular ejection fraction, by estimation, is 30 to 35%. The left ventricle has moderately decreased function. The left ventricle demonstrates global hypokinesis. The left ventricular internal cavity size was mildly dilated. Left ventricular diastolic parameters are indeterminate.  2. Right ventricular systolic function is normal. The right ventricular size is normal.  3. Left atrial size was severely dilated.  4. Right atrial size was mildly dilated.  5. The mitral valve is abnormal. Mild mitral valve regurgitation. No evidence of mitral stenosis.  6. The aortic valve is tricuspid. There is mild calcification of the aortic valve. There is mild thickening of the aortic valve. Aortic valve regurgitation is mild. No aortic stenosis is present.  7. The inferior vena cava is normal in size with greater than 50% respiratory variability, suggesting right atrial pressure of 3 mmHg. FINDINGS  Left Ventricle: Left ventricular ejection fraction, by estimation, is 30 to 35%. The left ventricle has moderately decreased function. The left ventricle demonstrates global hypokinesis. Definity contrast agent was given IV to delineate the left ventricular endocardial borders. The left ventricular internal cavity size was mildly dilated. There is no left ventricular hypertrophy. Left ventricular diastolic  parameters are indeterminate. Right Ventricle: The right ventricular size is normal. Right vetricular wall thickness was not well visualized. Right ventricular systolic  function is normal. Left Atrium: Left atrial size was severely dilated. Right Atrium: Right atrial size was mildly dilated. Pericardium: There is no evidence of pericardial effusion. Mitral Valve: The mitral valve is abnormal. Mild mitral valve regurgitation. No evidence of mitral valve stenosis. MV peak gradient, 4.5 mmHg. The mean mitral valve gradient is 2.0 mmHg. Tricuspid Valve: The tricuspid valve is normal in structure. Tricuspid valve regurgitation is not demonstrated. No evidence of tricuspid stenosis. Aortic Valve: The aortic valve is tricuspid. There is mild calcification of the aortic valve. There is mild thickening of the aortic valve. There is mild aortic valve annular calcification. Aortic valve regurgitation is mild. No aortic stenosis is present. Aortic valve mean gradient measures 3.0 mmHg. Aortic valve peak gradient measures 4.6 mmHg. Aortic valve area, by VTI measures 2.51 cm. Pulmonic Valve: The pulmonic valve was not well visualized. Pulmonic valve regurgitation is mild. No evidence of pulmonic stenosis. Aorta: The aortic root is normal in size and structure. Venous: The inferior vena cava is normal in size with greater than 50% respiratory variability, suggesting right atrial pressure of 3 mmHg. IAS/Shunts: No atrial level shunt detected by color flow Doppler.  LEFT VENTRICLE PLAX 2D LVIDd:         6.30 cm   Diastology LVIDs:         4.90 cm   LV e' medial:    7.50 cm/s LV PW:         1.10 cm   LV E/e' medial:  10.3 LV IVS:        1.10 cm   LV e' lateral:   11.30 cm/s LVOT diam:     2.20 cm   LV E/e' lateral: 6.9 LV SV:         55 LV SV Index:   24 LVOT Area:     3.80 cm  RIGHT VENTRICLE RV Basal diam:  3.70 cm RV Mid diam:    2.70 cm RV S prime:     11.70 cm/s TAPSE (M-mode): 2.2 cm LEFT ATRIUM              Index        RIGHT ATRIUM           Index LA diam:        5.70 cm  2.52 cm/m   RA Area:     20.90 cm LA Vol (A2C):   138.0 ml 61.11 ml/m  RA Volume:   57.80 ml  25.60 ml/m LA Vol  (A4C):   109.0 ml 48.27 ml/m LA Biplane Vol: 130.0 ml 57.57 ml/m  AORTIC VALVE                    PULMONIC VALVE AV Area (Vmax):    2.81 cm     PV Vmax:       0.78 m/s AV Area (Vmean):   2.68 cm     PV Peak grad:  2.4 mmHg AV Area (VTI):     2.51 cm AV Vmax:           107.00 cm/s AV Vmean:          82.400 cm/s AV VTI:            0.220 m AV Peak Grad:      4.6 mmHg AV Mean Grad:      3.0  mmHg LVOT Vmax:         79.00 cm/s LVOT Vmean:        58.200 cm/s LVOT VTI:          0.145 m LVOT/AV VTI ratio: 0.66  AORTA Ao Root diam: 3.30 cm MITRAL VALVE MV Area (PHT): 4.52 cm    SHUNTS MV Area VTI:   2.74 cm    Systemic VTI:  0.14 m MV Peak grad:  4.5 mmHg    Systemic Diam: 2.20 cm MV Mean grad:  2.0 mmHg MV Vmax:       1.06 m/s MV Vmean:      57.8 cm/s MV Decel Time: 168 msec MV E velocity: 77.60 cm/s Carlyle Dolly MD Electronically signed by Carlyle Dolly MD Signature Date/Time: 01/01/2023/2:27:08 PM    Final    CT Angio Chest PE W/Cm &/Or Wo Cm  Result Date: 01/01/2023 CLINICAL DATA:  Pulmonary embolism suspected, positive D-dimer with low to intermediate probability. Dry cough with wheezing. EXAM: CT ANGIOGRAPHY CHEST WITH CONTRAST TECHNIQUE: Multidetector CT imaging of the chest was performed using the standard protocol during bolus administration of intravenous contrast. Multiplanar CT image reconstructions and MIPs were obtained to evaluate the vascular anatomy. RADIATION DOSE REDUCTION: This exam was performed according to the departmental dose-optimization program which includes automated exposure control, adjustment of the mA and/or kV according to patient size and/or use of iterative reconstruction technique. CONTRAST:  118m OMNIPAQUE IOHEXOL 350 MG/ML SOLN COMPARISON:  Radiograph from earlier today FINDINGS: Cardiovascular: Satisfactory opacification of the pulmonary arteries to the segmental level. No evidence of pulmonary embolism. Normal heart size. No pericardial effusion. Mediastinum/Nodes:  Negative for adenopathy or mass Lungs/Pleura: Airway cuffing and interlobular septal thickening diffusely with airspace density at the bases, somewhat greater on the right but still bilateral and dependent. Small bilateral pleural effusion. Upper Abdomen: No acute finding Musculoskeletal: No acute finding Review of the MIP images confirms the above findings. IMPRESSION: 1. Pulmonary edema and small pleural effusions. 2. Negative for pulmonary embolism. Electronically Signed   By: JJorje GuildM.D.   On: 01/01/2023 04:27   DG Chest 2 View  Result Date: 01/01/2023 CLINICAL DATA:  SOB. Pt c/o SOB that started around 2100. Pt endorses a dry cough with audible wheezes. Denies, fever, n/v/d. Denies any cardiac or pulmonary hx. EXAM: CHEST - 2 VIEW COMPARISON:  Chest x-ray 04/24/2022 FINDINGS: The heart and mediastinal contours are unchanged No focal consolidation. Interval development of increased interstitial markings. No pleural effusion. No pneumothorax. No acute osseous abnormality. IMPRESSION: Pulmonary edema.  Differential diagnosis includes viral infection. Electronically Signed   By: MIven FinnM.D.   On: 01/01/2023 01:36   Disposition   Patient is being discharged home today in good condition.  Follow-up Plans & Appointments     Follow-up Information     [facility]   Specialty: Cardiology Why: Hospital follow up 01/11/2023 @ 11 am PLEASE bring a current medication list to appointment FREE valet parking, [address]trance [address], off [place]: [id number] [phone]       FKathlen Mody Cadence H, PA-C Follow up.   Specialty: Cardiology Why: Hospital follow-up with General Cardiology scheduled for 02/13/2023 at 3:00pm. Please arrive15 minutes early for check-in. If this date/time does not work for you, please call our office to reschedule. Contact  information: [id number]  Discharge Instructions     Diet - low sodium heart healthy   Complete by: As directed    Increase activity slowly   Complete by: As directed         Discharge Medications   Allergies as of 01/04/2023       Reactions   Other Other (See Comments)   Pet dander, pollen -> eyes itchy/watery, sneezing   Shellfish Allergy Itching        Medication List     TAKE these medications    allopurinol 100 MG tablet Commonly known as: ZYLOPRIM Take 100 mg by mouth daily.   dapagliflozin propanediol 10 MG Tabs tablet Commonly known as: FARXIGA Take 1 tablet (10 mg total) by mouth daily. Start taking on: January 05, 2023   diphenhydrAMINE 25 mg capsule Commonly known as: BENADRYL Take 25 mg by mouth every 6 (six) hours as needed for allergies or itching.   furosemide 40 MG tablet Commonly known as: Lasix Take 1 tablet (40 mg total) by mouth daily as needed (weight gain (3lbs in 1 day or 5lbs in 1 week) or worsening swelling/edema).   losartan 25 MG tablet Commonly known as: COZAAR Take 0.5 tablets (12.5 mg total) by mouth daily. Start taking on: January 05, 2023   metoprolol succinate 25 MG 24 hr tablet Commonly known as: TOPROL-XL Take 0.5 tablets (12.5 mg total) by mouth daily. Start taking on: January 05, 2023   omeprazole 20 MG capsule Commonly known as: PRILOSEC Take 20 mg by mouth daily.   potassium chloride SA 20 MEQ tablet Commonly known as: KLOR-CON M Take 1 tablet (20 mEq total) by mouth daily as needed (when you need to take the Lasix).   Synthroid 112 MCG tablet Generic drug: levothyroxine Take 2 tablets by mouth daily.           Outstanding Labs/Studies   Repeat BMET at follow-up visit.  Duration of Discharge Encounter   Greater than 30 minutes including physician time.  Signed, Darreld Mclean, PA-C 01/04/2023, 11:59 AM  I have examined the  patient and reviewed assessment and plan and discussed with patient.  Agree with above as stated.    GEN: Well nourished, well developed, in no acute distress  HEENT: normal  Neck: no JVD, carotid bruits, or masses Cardiac: RRR; no murmurs, rubs, or gallops,no edema  Respiratory:  clear to auscultation bilaterally, normal work of breathing GI: soft, nontender, nondistended,  MS: no deformity or atrophy ; right radial site stable, without hematoma Skin: warm and dry, no rash Neuro:  Strength and sensation are intact Psych: euthymic mood, full affect   Nonischemic cardiomyopathy.  Continue medical therapy.  Appears euvolemic.  Low salt diet.  Watch for weight gain.  Will need to titrate diuretics based on if he notes sudden weight gain.   TIME SPENT WITH PATIENT: 40 minutes of direct patient care. More than 50% of that time was spent by me on coordination of care and counseling regarding medications, fluid management in the setting of nonischemic cardiomyopathy.   Larae Grooms

## 2023-01-10 ENCOUNTER — Telehealth (HOSPITAL_COMMUNITY): Payer: Self-pay

## 2023-01-10 NOTE — Progress Notes (Signed)
HEART & VASCULAR TRANSITION OF CARE CONSULT NOTE     Referring Physician: Dr Scarlette Calico  Primary Care: [facility] Primary Cardiologist:  HPI: Referred to clinic by Dr Scarlette Calico for heart failure consultation.   Mr Payer is a 57 year old with history of HFrEF, NICM, thyroid cancer/ S/P thyroidectomy 2017, GERD, and chronic HFrEF.  No family history of coronary disease/heart failiure/amyloid/sarcoid.    Had Covid  summer 2023. Developed a cough about 4 months ago. Progressively worse.  Typically very active lifting weights and walking/running.   Admitted to [facility] 01/01/23 with new HFrEF. Transferred to [facility] for additional cath. Cath showed no CAD and preserved cardiac output. HIV NR. TSH 1.6 . Started on GDMT and discharged to home on 01/04/23.  Overall feeling ok. Having some fatigue but not to bad. Denies SOB/PND. Orthopnea. rthopnea. He has not been walking up inclines. Lives on 1 level home.  Appetite ok. Follows low salt diet and limits fluid intake to < 2 liter per day. No fever or chills. Weight at home 224-229  pounds. He took lasix one  time since discharge. Taking all medications. Working full time and has been able to work remotely.  Drinks a few drinks on the weekends.   Cardiac Testing  Echo EF 30-35% RV normal   Cath 01/03/23 No CAD RA 1 PA 23/5 PCWP 7  CO 5 CI 2.2   Review of Systems: [y] = yes, '[ ]'$  = no   General: Weight gain '[ ]'$ ; Weight loss '[ ]'$ ; Anorexia '[ ]'$ ; Fatigue [ Y]; Fever '[ ]'$ ; Chills '[ ]'$ ; Weakness '[ ]'$   Cardiac: Chest pain/pressure '[ ]'$ ; Resting SOB '[ ]'$ ; Exertional SOB '[ ]'$ ; Orthopnea '[ ]'$ ; Pedal Edema '[ ]'$ ; Palpitations '[ ]'$ ; Syncope '[ ]'$ ; Presyncope '[ ]'$ ; Paroxysmal nocturnal dyspnea'[ ]'$   Pulmonary: Cough '[ ]'$ ; Wheezing'[ ]'$ ; Hemoptysis'[ ]'$ ; Sputum '[ ]'$ ; Snoring '[ ]'$   GI: Vomiting'[ ]'$ ; Dysphagia'[ ]'$ ; Melena'[ ]'$ ; Hematochezia '[ ]'$ ; Heartburn'[ ]'$ ; Abdominal pain '[ ]'$ ; Constipation '[ ]'$ ; Diarrhea '[ ]'$ ; BRBPR '[ ]'$   GU: Hematuria'[ ]'$ ; Dysuria '[ ]'$ ; Nocturia'[ ]'$   Vascular: Pain  in legs with walking '[ ]'$ ; Pain in feet with lying flat '[ ]'$ ; Non-healing sores '[ ]'$ ; Stroke '[ ]'$ ; TIA '[ ]'$ ; Slurred speech '[ ]'$ ;  Neuro: Headaches'[ ]'$ ; Vertigo'[ ]'$ ; Seizures'[ ]'$ ; Paresthesias'[ ]'$ ;Blurred vision '[ ]'$ ; Diplopia '[ ]'$ ; Vision changes '[ ]'$   Ortho/Skin: Arthritis '[ ]'$ ; Joint pain '[ ]'$ ; Muscle pain '[ ]'$ ; Joint swelling '[ ]'$ ; Back Pain '[ ]'$ ; Rash '[ ]'$   Psych: Depression'[ ]'$ ; Anxiety'[ ]'$   Heme: Bleeding problems '[ ]'$ ; Clotting disorders '[ ]'$ ; Anemia '[ ]'$   Endocrine: Diabetes '[ ]'$ ; Thyroid dysfunction[Y ]   Past Medical History:  Diagnosis Date   Cancer ([place])    Horner's syndrome     Current Outpatient Medications  Medication Sig Dispense Refill   allopurinol (ZYLOPRIM) 100 MG tablet Take 100 mg by mouth daily.     dapagliflozin propanediol (FARXIGA) 10 MG TABS tablet Take 1 tablet (10 mg total) by mouth daily. 30 tablet 2   diphenhydrAMINE (BENADRYL) 25 mg capsule Take 25 mg by mouth every 6 (six) hours as needed for allergies or itching.     furosemide (LASIX) 40 MG tablet Take 1 tablet (40 mg total) by mouth daily as needed (weight gain (3lbs in 1 day or 5lbs in 1 week) or worsening swelling/edema). 30 tablet 1   levothyroxine (SYNTHROID) 112 MCG tablet Take 2  tablets by mouth daily.     losartan (COZAAR) 25 MG tablet Take 1/2 tablet (12.5 mg total) by mouth daily. 15 tablet 2   metoprolol succinate (TOPROL-XL) 25 MG 24 hr tablet Take 1/2 tablet (12.5 mg total) by mouth daily. 15 tablet 2   omeprazole (PRILOSEC) 20 MG capsule Take 20 mg by mouth daily.     potassium chloride SA (KLOR-CON M) 20 MEQ tablet Take 1 tablet (20 mEq total) by mouth daily as needed (when you need to take the Lasix). 30 tablet 1   No current facility-administered medications for this encounter.    Allergies  Allergen Reactions   Other Other (See Comments)    Pet dander, pollen -> eyes itchy/watery, sneezing   Shellfish Allergy Itching      Social History   Socioeconomic History   Marital status: Married    Spouse  name: Emmily   Number of children: 8   Years of education: Not on file   Highest education level: High school graduate  Occupational History   Occupation: [employer]  Tobacco Use   Smoking status: Former    Types: Cigarettes    Quit date: 1996    Years since quitting: 28.0   Smokeless tobacco: Never  Vaping Use   Vaping Use: Never used  Substance and Sexual Activity   Alcohol use: Yes    Alcohol/week: 2.0 standard drinks of alcohol    Types: 2 Glasses of wine per week   Drug use: Not Currently   Sexual activity: Not on file  Other Topics Concern   Not on file  Social History Narrative   Not on file   Social Determinants of Health   Financial Resource Strain: Low Risk  (01/04/2023)   Overall Financial Resource Strain (CARDIA)    Difficulty of Paying Living Expenses: Not very hard  Food Insecurity: No Food Insecurity (01/01/2023)   Hunger Vital Sign    Worried About Running Out of Food in the Last Year: Never true    Ran Out of Food in the Last Year: Never true  Transportation Needs: No Transportation Needs (01/01/2023)   PRAPARE - Hydrologist (Medical): No    Lack of Transportation (Non-Medical): No  Physical Activity: Not on file  Stress: Not on file  Social Connections: Not on file  Intimate Partner Violence: Not At Risk (01/01/2023)   Humiliation, Afraid, Rape, and Kick questionnaire    Fear of Current or Ex-Partner: No    Emotionally Abused: No    Physically Abused: No    Sexually Abused: No      Family History  Problem Relation Age of Onset   Valvular heart disease Mother    Stroke Father    Heart murmur Brother    Atrial fibrillation Brother      Vitals:   01/11/23 1117  BP: 110/70  Pulse: 81  SpO2: 99%  Weight: 106.3 kg (234 lb 6.4 oz)   Wt Readings from Last 3 Encounters:  01/11/23 106.3 kg (234 lb 6.4 oz)  01/04/23 103.7 kg (228 lb 9.6 oz)  04/24/22 106.6 kg (235 lb)     PHYSICAL EXAM: General:  Walked int he  clinic. No respiratory difficulty HEENT: normal Neck: supple. JVP 7-8 . Carotids 2+ bilat; no bruits. No lymphadenopathy or thryomegaly appreciated. Cor: PMI nondisplaced. Regular rate & rhythm. No rubs, gallops or murmurs. Lungs: clear Abdomen: soft, nontender, nondistended. No hepatosplenomegaly. No bruits or masses. Good bowel sounds. Extremities: no cyanosis,  clubbing, rash, edema Neuro: alert & oriented x 3, cranial nerves grossly intact. moves all 4 extremities w/o difficulty. Affect pleasant.  ECG:SR 82 bpm    ASSESSMENT & PLAN: 1. Chronic HFrEF, NICM  Echo Ef 30-35% Cath no coronary disease. Unclear etiology. No recent virus. No family history of CAD/HF/sarcoid/amyloid. HIV NR. TSH ok.  Set up CMRI to further assess NICM .  NYHA II. Reds Clip 40%. Volume status mildly elevated.  GDMT  Diuretic- Continue lasix as needed.  BB- Continue Topropl XL 12.5 mg daily  Ace/ARB/ARNI- Continue 12.5 mg losartan daily  MRA- Hold off.  SGLT2i- Continue farxiga 10 mg daily  - Check BMET  - EKG - QRS 120 ms   2. Thyroid Cancer-->S/P Thyroidectomy 2017  - Treated with radiation  -On Levothyroxine.   3. H/O AKI  -Creatinine elevated at the time of discharge 1.4.  - Check BMET   Referred to HFSW (PCP, Medications, Transportation, ETOH Abuse, Drug Abuse, Insurance, Financial ): No Refer to Pharmacy:  No Refer to Home Health: No Refer to Advanced Heart Failure Clinic: Yes ---> Dr Loma Boston.  Refer to General Cardiology: Shared.   Follow up in 2 weeks with Dr Daniel Nones. Set up CMRI>   Orley Lawry NP-C  11:26 AM

## 2023-01-10 NOTE — Telephone Encounter (Signed)
Called to confirm Heart & Vascular Transitions of Care appointment at 01/11/23. Patient reminded to bring all medications and pill box organizer with them. Confirmed patient has transportation. Gave directions, instructed to utilize [place] parking.  Confirmed appointment prior to ending call.

## 2023-01-11 ENCOUNTER — Ambulatory Visit (HOSPITAL_COMMUNITY)
Admit: 2023-01-11 | Discharge: 2023-01-11 | Disposition: A | Discharge status: Home / Self Care | Payer: No Typology Code available for payment source | Attending: Adult Health | Admitting: Adult Health

## 2023-01-11 ENCOUNTER — Encounter (HOSPITAL_COMMUNITY): Payer: Self-pay

## 2023-01-11 VITALS — BP 110/70 | HR 81 | Wt 234.4 lb

## 2023-01-11 DIAGNOSIS — N179 Acute kidney failure, unspecified: Secondary | ICD-10-CM | POA: Diagnosis not present

## 2023-01-11 DIAGNOSIS — E890 Postprocedural hypothyroidism: Secondary | ICD-10-CM | POA: Diagnosis not present

## 2023-01-11 DIAGNOSIS — I428 Other cardiomyopathies: Secondary | ICD-10-CM | POA: Diagnosis not present

## 2023-01-11 DIAGNOSIS — Z79890 Hormone replacement therapy (postmenopausal): Secondary | ICD-10-CM | POA: Diagnosis not present

## 2023-01-11 DIAGNOSIS — R5383 Other fatigue: Secondary | ICD-10-CM | POA: Insufficient documentation

## 2023-01-11 DIAGNOSIS — Z85850 Personal history of malignant neoplasm of thyroid: Secondary | ICD-10-CM | POA: Insufficient documentation

## 2023-01-11 DIAGNOSIS — K219 Gastro-esophageal reflux disease without esophagitis: Secondary | ICD-10-CM | POA: Insufficient documentation

## 2023-01-11 DIAGNOSIS — Z79899 Other long term (current) drug therapy: Secondary | ICD-10-CM | POA: Diagnosis not present

## 2023-01-11 DIAGNOSIS — I5022 Chronic systolic (congestive) heart failure: Secondary | ICD-10-CM

## 2023-01-11 LAB — BASIC METABOLIC PANEL
Anion gap: 10 (ref 5–15)
BUN: 15 mg/dL (ref 6–20)
CO2: 22 mmol/L (ref 22–32)
Calcium: 9.6 mg/dL (ref 8.9–10.3)
Chloride: 101 mmol/L (ref 98–111)
Creatinine, Ser: 1.08 mg/dL (ref 0.61–1.24)
GFR, Estimated: >60 mL/min (ref >60)
Glucose, Bld: 94 mg/dL (ref 70–99)
Potassium: 4.3 mmol/L (ref 3.5–5.1)
Sodium: 133 mmol/L — ABNORMAL LOW (ref 135–145)

## 2023-01-11 LAB — BRAIN NATRIURETIC PEPTIDE: B Natriuretic Peptide: 87.1 pg/mL (ref 0.0–100.0)

## 2023-01-11 NOTE — Progress Notes (Signed)
ReDS Vest / Clip - 01/11/23 1117       ReDS Vest / Clip   Station Marker D    Ruler Value 42    ReDS Value Range Moderate volume overload    ReDS Actual Value 40

## 2023-01-11 NOTE — Patient Instructions (Addendum)
Labs done today. We will contact you only if your labs are abnormal.  Take Lasix and Potassium by mouth daily for 2 days then go back to as needed.   No other medication changes were made. Please continue all current medications as prescribed.  Your physician has requested that you have a cardiac MRI. Cardiac MRI uses a computer to create images of your heart as its beating, producing both still and moving pictures of your heart and major blood vessels. This has to be approved through your insurance company prior to scheduling, once approved we will contact you to schedule an appointment.   Your physician recommends that you schedule a follow-up appointment in: 2 weeks with Dr. Daniel Nones  If you have any questions or concerns before your next appointment please send [place] a message through [facility] or call our office at [phone].    TO LEAVE A MESSAGE FOR THE NURSE SELECT OPTION 2, PLEASE LEAVE A MESSAGE INCLUDING: YOUR NAME DATE OF BIRTH CALL BACK NUMBER REASON FOR CALL**this is important as we prioritize the call backs  YOU WILL RECEIVE A CALL BACK THE SAME DAY AS LONG AS YOU CALL BEFORE 4:00 PM   Do the following things EVERYDAY: Weigh yourself in the morning before breakfast. Write it down and keep it in a log. Take your medicines as prescribed Eat low salt foods--Limit salt (sodium) to 2000 mg per day.  Stay as active as you can everyday Limit all fluids for the day to less than 2 liters   At the [facility], you and your health needs are our priority. As part of our continuing mission to provide you with exceptional heart care, we have created designated Provider Care Teams. These Care Teams include your primary Cardiologist (physician) and Advanced Practice Providers (APPs- Physician Assistants and Nurse Practitioners) who all work together to provide you with the care you need, when you need it.   You may see any of the following providers on your designated  Care Team at your next follow up: Dr Glori Bickers Dr Haynes Kerns, NP Lyda Jester, [place] Audry Riles, PharmD   Please be sure to bring in all your medications bottles to every appointment.

## 2023-01-28 ENCOUNTER — Ambulatory Visit (HOSPITAL_COMMUNITY): Payer: No Typology Code available for payment source

## 2023-01-28 ENCOUNTER — Other Ambulatory Visit (HOSPITAL_COMMUNITY): Payer: Self-pay

## 2023-01-29 ENCOUNTER — Ambulatory Visit (HOSPITAL_COMMUNITY): Admission: RE | Admit: 2023-01-29 | Payer: No Typology Code available for payment source | Source: Ambulatory Visit

## 2023-01-29 ENCOUNTER — Other Ambulatory Visit (HOSPITAL_COMMUNITY): Payer: Self-pay

## 2023-01-29 ENCOUNTER — Other Ambulatory Visit: Payer: Self-pay

## 2023-01-29 ENCOUNTER — Other Ambulatory Visit: Payer: Self-pay | Admitting: Student

## 2023-01-29 MED ORDER — METOPROLOL SUCCINATE ER 25 MG PO TB24: 12.5000 mg | ORAL_TABLET | Freq: Every day | ORAL | 2 refills | Status: DC

## 2023-01-29 MED ORDER — LOSARTAN POTASSIUM 25 MG PO TABS: 12.5000 mg | ORAL_TABLET | Freq: Every day | ORAL | 2 refills | Status: DC

## 2023-02-01 ENCOUNTER — Other Ambulatory Visit (HOSPITAL_COMMUNITY): Payer: Self-pay

## 2023-02-04 ENCOUNTER — Other Ambulatory Visit (HOSPITAL_COMMUNITY): Payer: Self-pay

## 2023-02-04 ENCOUNTER — Ambulatory Visit (HOSPITAL_COMMUNITY)
Admission: RE | Admit: 2023-02-04 | Discharge: 2023-02-04 | Disposition: A | Discharge status: Home / Self Care | Payer: No Typology Code available for payment source | Source: Ambulatory Visit | Attending: Cardiology | Admitting: Cardiology

## 2023-02-04 ENCOUNTER — Encounter (HOSPITAL_COMMUNITY): Payer: Self-pay | Admitting: Cardiology

## 2023-02-04 ENCOUNTER — Other Ambulatory Visit (HOSPITAL_COMMUNITY): Payer: Self-pay | Admitting: Cardiology

## 2023-02-04 VITALS — BP 122/80 | HR 73 | Wt 233.8 lb

## 2023-02-04 DIAGNOSIS — Z79899 Other long term (current) drug therapy: Secondary | ICD-10-CM | POA: Insufficient documentation

## 2023-02-04 DIAGNOSIS — Z923 Personal history of irradiation: Secondary | ICD-10-CM | POA: Insufficient documentation

## 2023-02-04 DIAGNOSIS — R002 Palpitations: Secondary | ICD-10-CM | POA: Diagnosis not present

## 2023-02-04 DIAGNOSIS — Z85850 Personal history of malignant neoplasm of thyroid: Secondary | ICD-10-CM | POA: Diagnosis not present

## 2023-02-04 DIAGNOSIS — I5020 Unspecified systolic (congestive) heart failure: Secondary | ICD-10-CM | POA: Insufficient documentation

## 2023-02-04 DIAGNOSIS — C73 Malignant neoplasm of thyroid gland: Secondary | ICD-10-CM | POA: Diagnosis not present

## 2023-02-04 DIAGNOSIS — Z8616 Personal history of COVID-19: Secondary | ICD-10-CM | POA: Insufficient documentation

## 2023-02-04 DIAGNOSIS — E039 Hypothyroidism, unspecified: Secondary | ICD-10-CM

## 2023-02-04 DIAGNOSIS — I493 Ventricular premature depolarization: Secondary | ICD-10-CM

## 2023-02-04 DIAGNOSIS — I428 Other cardiomyopathies: Secondary | ICD-10-CM | POA: Insufficient documentation

## 2023-02-04 DIAGNOSIS — E890 Postprocedural hypothyroidism: Secondary | ICD-10-CM | POA: Diagnosis not present

## 2023-02-04 DIAGNOSIS — Z79890 Hormone replacement therapy (postmenopausal): Secondary | ICD-10-CM | POA: Insufficient documentation

## 2023-02-04 DIAGNOSIS — K219 Gastro-esophageal reflux disease without esophagitis: Secondary | ICD-10-CM | POA: Insufficient documentation

## 2023-02-04 DIAGNOSIS — I5022 Chronic systolic (congestive) heart failure: Secondary | ICD-10-CM

## 2023-02-04 DIAGNOSIS — G4730 Sleep apnea, unspecified: Secondary | ICD-10-CM | POA: Diagnosis not present

## 2023-02-04 LAB — BASIC METABOLIC PANEL
Anion gap: 9 (ref 5–15)
BUN: 8 mg/dL (ref 6–20)
CO2: 24 mmol/L (ref 22–32)
Calcium: 9.5 mg/dL (ref 8.9–10.3)
Chloride: 102 mmol/L (ref 98–111)
Creatinine, Ser: 0.97 mg/dL (ref 0.61–1.24)
GFR, Estimated: >60 mL/min (ref >60)
Glucose, Bld: 96 mg/dL (ref 70–99)
Potassium: 4.1 mmol/L (ref 3.5–5.1)
Sodium: 135 mmol/L (ref 135–145)

## 2023-02-04 LAB — BRAIN NATRIURETIC PEPTIDE: B Natriuretic Peptide: 71.3 pg/mL (ref 0.0–100.0)

## 2023-02-04 MED ORDER — METOPROLOL SUCCINATE ER 25 MG PO TB24: 25.0000 mg | ORAL_TABLET | Freq: Every day | ORAL | 3 refills | Status: DC

## 2023-02-04 MED ORDER — SPIRONOLACTONE 25 MG PO TABS
12.5000 mg | ORAL_TABLET | Freq: Every day | ORAL | 3 refills | Status: DC
  Filled 2023-02-04: qty 15, 30d supply, fill #0
  Filled 2023-03-13: qty 15, 30d supply, fill #1
  Filled 2023-04-13: qty 15, 30d supply, fill #2

## 2023-02-04 MED ORDER — DAPAGLIFLOZIN PROPANEDIOL 10 MG PO TABS: 10.0000 mg | ORAL_TABLET | Freq: Every day | ORAL | 11 refills | Status: DC

## 2023-02-04 MED ORDER — METOPROLOL SUCCINATE ER 25 MG PO TB24
25.0000 mg | ORAL_TABLET | Freq: Every day | ORAL | 3 refills | Status: DC
  Filled 2023-02-04: qty 90, 90d supply, fill #0

## 2023-02-04 MED ORDER — DAPAGLIFLOZIN PROPANEDIOL 10 MG PO TABS
10.0000 mg | ORAL_TABLET | Freq: Every day | ORAL | 11 refills | Status: DC
  Filled 2023-02-04: qty 30, 30d supply, fill #0

## 2023-02-04 MED ORDER — SPIRONOLACTONE 25 MG PO TABS: 12.5000 mg | ORAL_TABLET | Freq: Every day | ORAL | 3 refills | Status: DC

## 2023-02-04 MED ORDER — ENTRESTO 24-26 MG PO TABS
1.0000 | ORAL_TABLET | Freq: Two times a day (BID) | ORAL | 0 refills | Status: DC
  Filled 2023-02-04: qty 60, 30d supply, fill #0

## 2023-02-04 MED ORDER — ENTRESTO 24-26 MG PO TABS
1.0000 | ORAL_TABLET | Freq: Two times a day (BID) | ORAL | 11 refills | Status: DC
  Filled 2023-02-04: qty 60, 30d supply, fill #0
  Filled 2023-03-13 – 2023-04-13 (×2): qty 60, 30d supply, fill #1

## 2023-02-04 MED ORDER — DAPAGLIFLOZIN PROPANEDIOL 10 MG PO TABS
10.0000 mg | ORAL_TABLET | Freq: Every day | ORAL | 11 refills | Status: DC
  Filled 2023-02-04 – 2023-03-13 (×2): qty 30, 30d supply, fill #0
  Filled 2023-04-13: qty 30, 30d supply, fill #1
  Filled 2023-05-14: qty 30, 30d supply, fill #2
  Filled 2023-06-16: qty 30, 30d supply, fill #3
  Filled 2023-07-11: qty 30, 30d supply, fill #4
  Filled 2023-09-02 (×2): qty 30, 30d supply, fill #5
  Filled 2023-09-22 – 2023-09-25 (×3): qty 30, 30d supply, fill #6
  Filled 2023-10-02 – 2023-10-20 (×2): qty 30, 30d supply, fill #7
  Filled 2023-10-29 – 2023-11-24 (×2): qty 30, 30d supply, fill #8
  Filled 2023-12-09 – 2023-12-17 (×3): qty 30, 30d supply, fill #9
  Filled 2024-01-13: qty 30, 30d supply, fill #10
  Filled ????-??-??: fill #6

## 2023-02-04 MED ORDER — ENTRESTO 24-26 MG PO TABS: 1.0000 | ORAL_TABLET | Freq: Two times a day (BID) | ORAL | 11 refills | Status: DC

## 2023-02-04 NOTE — Progress Notes (Signed)
Medication Samples have been provided to the patient.  Drug name: Delene Loll       Strength: 24/26 mg        Qty: 2  LOTYE:[id number]  Exp.Date: 03/2025  Dosing instructions: Take 1 tablet Twice daily   The patient has been instructed regarding the correct time, dose, and frequency of taking this medication, including desired effects and most common side effects.   Juanita Laster Audia Amick 1:32 PM 02/04/2023

## 2023-02-04 NOTE — Progress Notes (Addendum)
ADVANCED HEART FAILURE CLINIC NOTE  Referring Physician: [facility]  Primary Care: [facility], [place] Primary Cardiologist: Dr. Scarlette Calico  HPI: Elijah Perez. is a 57 y.o. male with heart failure with reduced ejection fraction secondary to nonischemic cardiomyopathy, thyroid cancer status post thyroidectomy in 2017, history of COVID-19 in 2023, GERD presenting today to establish care.  Elijah Perez cardiac history dates back to January 01, 2023 when he was admitted to [facility] with acute systolic heart failure.  He had a left heart cath that was unremarkable and right heart cath that demonstrated preserved cardiac output.  He was started on low-dose GDMT and since that time is followed up in [facility] clinic where GDMT was further uptitrated.  Interval history Since his last visit, Elijah Perez has been closely monitoring his weights. He is only taking lasix PRN based on weights; only required it twice in the past ~18m His dry weight appears to be around 226lbs. His wife is a nMarine scientist Since discharge from the hospital, he does not feel significant limitations. He has had some "tightness" in his chest with excessive activity.  Prior to his diagnosis of heart failure he reports some fatigue and 'heart racing' during his work outs. He ran a 5K in November and although he completed it he felt much more fatigued than usual. Prior to this he could run a 677mile. No supplements for exercise other than protein.   Activity level/exercise tolerance: NYHA II Orthopnea:  Sleeps flat Paroxysmal noctural dyspnea:  No Chest pain/pressure:  No Orthostatic lightheadedness:  No Palpitations:  infrequent; had them frequently during work-outs preceding his diagnosis of HFrEF Lower extremity edema:  No Presyncope/syncope:  No Cough:  No  Past Medical History:  Diagnosis Date   [place]   Horner's syndrome     Current Outpatient Medications  Medication Sig Dispense Refill    allopurinol (ZYLOPRIM) 100 MG tablet Take 100 mg by mouth daily.     diphenhydrAMINE (BENADRYL) 25 mg capsule Take 25 mg by mouth every 6 (six) hours as needed for allergies or itching.     furosemide (LASIX) 40 MG tablet Take 1 tablet (40 mg total) by mouth daily as needed (weight gain (3lbs in 1 day or 5lbs in 1 week) or worsening swelling/edema). 30 tablet 1   levothyroxine (SYNTHROID) 200 MCG tablet Take 200 mcg by mouth daily before breakfast.     omeprazole (PRILOSEC) 20 MG capsule Take 20 mg by mouth daily.     potassium chloride SA (KLOR-CON M) 20 MEQ tablet Take 1 tablet (20 mEq total) by mouth daily as needed (when you need to take the Lasix). 30 tablet 1   dapagliflozin propanediol (FARXIGA) 10 MG TABS tablet Take 1 tablet (10 mg total) by mouth daily. 30 tablet 11   metoprolol succinate (TOPROL-XL) 25 MG 24 hr tablet Take 1 tablet (25 mg total) by mouth daily. 90 tablet 3   sacubitril-valsartan (ENTRESTO) 24-26 MG Take 1 tablet by mouth 2 (two) times daily. 60 tablet 11   spironolactone (ALDACTONE) 25 MG tablet Take 1/2 tablet (12.5 mg total) by mouth daily. 45 tablet 3   No current facility-administered medications for this encounter.    Allergies  Allergen Reactions   Other Other (See Comments)    Pet dander, pollen -> eyes itchy/watery, sneezing   Shellfish Allergy Itching      Social History   Socioeconomic History   Marital status: Married    Spouse name: EmMudlogger  Number of children: 8   Years of education: Not on file   Highest education level: High school graduate  Occupational History   Occupation: [employer]  Tobacco Use   Smoking status: Former    Types: Cigarettes    Quit date: 1996    Years since quitting: 28.1   Smokeless tobacco: Never  Vaping Use   Vaping Use: Never used  Substance and Sexual Activity   Alcohol use: Yes    Alcohol/week: 2.0 standard drinks of alcohol    Types: 2 Glasses of wine per week   Drug use: Not Currently   Sexual activity:  Not on file  Other Topics Concern   Not on file  Social History Narrative   Not on file   Social Determinants of Health   Financial Resource Strain: Low Risk  (01/04/2023)   Overall Financial Resource Strain (CARDIA)    Difficulty of Paying Living Expenses: Not very hard  Food Insecurity: No Food Insecurity (01/01/2023)   Hunger Vital Sign    Worried About Running Out of Food in the Last Year: Never true    Ran Out of Food in the Last Year: Never true  Transportation Needs: No Transportation Needs (01/01/2023)   PRAPARE - Hydrologist (Medical): No    Lack of Transportation (Non-Medical): No  Physical Activity: Not on file  Stress: Not on file  Social Connections: Not on file  Intimate Partner Violence: Not At Risk (01/01/2023)   Humiliation, Afraid, Rape, and Kick questionnaire    Fear of Current or Ex-Partner: No    Emotionally Abused: No    Physically Abused: No    Sexually Abused: No      Family History  Problem Relation Age of Onset   Valvular heart disease Mother    Stroke Father    Heart murmur Brother    Atrial fibrillation Brother     PHYSICAL EXAM: Vitals:   02/04/23 1140  BP: 122/80  Pulse: 73  SpO2: 99%   GENERAL: Well nourished, well developed, and in no apparent distress at rest.  HEENT: Negative for arcus senilis or xanthelasma. There is no scleral icterus.  The mucous membranes are pink and moist.   NECK: Supple, No masses. Normal carotid upstrokes without bruits. No masses or thyromegaly.    CHEST: There are no chest wall deformities. There is no chest wall tenderness. Respirations are unlabored.  Lungs- CTA B/L CARDIAC:  JVP: 7 cm H2O         Normal S1, S2  Normal rate with regular rhythm. No murmurs, rubs or gallops.  Pulses are 2+ and symmetrical in upper and lower extremities. No edema.  ABDOMEN: Soft, non-tender, non-distended. There are no masses or hepatomegaly. There are normal bowel sounds.  EXTREMITIES: Warm and  well perfused with no cyanosis, clubbing.  LYMPHATIC: No axillary or supraclavicular lymphadenopathy.  NEUROLOGIC: Patient is oriented x3 with no focal or lateralizing neurologic deficits.  PSYCH: Patients affect is appropriate, there is no evidence of anxiety or depression.  SKIN: Warm and dry; no lesions or wounds.   DATA REVIEW  ECG: 01/11/23: NSR with PVC.   ECHO: 01/01/23: LVEF 30 to 35% with global hypokinesis.  Normal RV function.  Severely dilated left atrium.  CATH: 01/03/23: No angiographic evidence of CAD Normal right and left heart pressures (RA 1, RV 22/0/2, PA 23/5 mean 12, PCWP 7, LV 102/3/9, AO 108/71, CO 4.97 L/min, CI 2.22)   ASSESSMENT & PLAN:  Heart  failure with reduced ejection fraction Etiology of HF: Nonischemic cardiomyopathy, left heart cath as demonstrated above with no evidence of CAD. Reports frequent palpitations prior to dx of HFrEF, enlarged LA on TTE. Ziopatch to evaluate for tachyarrhythmias. Otherwise, no significant FH. Has lifted heavy weights for years; however, no reported use of anabolic steroids or other products aside from protein/creatine.  NYHA class / AHA Stage:NYHA II Volume status & Diuretics: euvolemic; has only required PRN lasix twice since TOC visit.  Vasodilators:D/C Losartan, start entresto 24/77m BID Beta-Blocker:Increase metoprolol to 250mdaily [id number] 12.69m39maily Cardiometabolic:continue jardiance Devices therapies & Valvulopathies:not indicated Advanced therapies:not indicated.   2. Thyroid Cancer S/P thyroidectomy  - Recently reduced levothyroxine dose, now on 200m23maily.  - S/P radiation and radioactive iodone.  - No chemotherapy.   3. Sleep Apnea - reports that it was very mild and CPAP was not advised.   Personally reviewed echocardiogram, ECGs, imaging; reviewed this with patient.   Subrina Vecchiarelli Advanced Heart Failure Mechanical Circulatory Support

## 2023-02-04 NOTE — Patient Instructions (Signed)
STOP Losartan  START Entresto 24/26 mg Twice daily  INCREASE Metoprolol to 25 mg daily.   START Spironolactone 12.64m ( 1/2 Tab ) daily.  Labs done today, your results will be available in MyChart, we will contact you for abnormal readings.  Your physician has requested that you have an echocardiogram. Echocardiography is a painless test that uses sound waves to create images of your heart. It provides your doctor with information about the size and shape of your heart and how well your heart's chambers and valves are working. This procedure takes approximately one hour. There are no restrictions for this procedure. Please do NOT wear cologne, perfume, aftershave, or lotions (deodorant is allowed). Please arrive 15 minutes prior to your appointment time.  Your provider has recommended that  you wear a Zio Patch for 7 days.  This monitor will record your heart rhythm for our review.  IF you have any symptoms while wearing the monitor please press the button.  If you have any issues with the patch or you notice a red or orange light on it please call the company at [phone]  Once you remove the patch please mail it back to the company as soon as possible so we can get the results.   Your physician recommends that you schedule a follow-up appointment in: 2 months with echocardiogram (April ) ** please call the office in March to arrange your follow up   If you have any questions or concerns before your next appointment please send [place] message through [email or web address] call our office at [phone]    TO LEAVE A MESSAGE FOR THE NURSE SELECT OPTION 2, PLEASE LEAVE A MESSAGE INCLUDING: YOUR NAME DATE OF BIRTH CALL BACK NUMBER REASON FOR CALL**this is important as we prioritize the call backs  YOU WILL RECEIVE A CALL BACK THE SAME DAY AS LONG AS YOU CALL BEFORE 4:00 PM  [facility] you and your health needs are our priority. As part of our continuing mission to  provide you with exceptional heart care, we have created designated Provider Care Teams. These Care Teams include your primary Cardiologist (physician) and Advanced Practice Providers (APPs- Physician Assistants and Nurse Practitioners) who all work together to provide you with the care you need, when you need it.   You may see any of the following providers on your designated Care Team at your next follow up: Dr DGlori BickersDr DLoralie ChampagneDr. ARoxana Hires NP BLyda Jester PU[facility]JMer[facility] PUtahAForestine Na NP LAudry Riles PharmD   Please be sure to bring in all your medications bottles to every appointment.    [facility]

## 2023-02-04 NOTE — Addendum Note (Signed)
Encounter addended by: Stanford Scotland, RN on: 02/04/2023 1:34 PM  Actions taken: Clinical Note Signed

## 2023-02-13 ENCOUNTER — Ambulatory Visit: Payer: No Typology Code available for payment source | Attending: Medical | Admitting: Medical

## 2023-02-13 ENCOUNTER — Other Ambulatory Visit (HOSPITAL_COMMUNITY)
Admission: RE | Admit: 2023-02-13 | Discharge: 2023-02-13 | Disposition: A | Discharge status: Home / Self Care | Payer: No Typology Code available for payment source | Attending: Medical | Admitting: Medical

## 2023-02-13 ENCOUNTER — Encounter: Payer: Self-pay | Admitting: Medical

## 2023-02-13 VITALS — BP 110/62 | HR 71 | Ht 71.0 in | Wt 232.0 lb

## 2023-02-13 DIAGNOSIS — E039 Hypothyroidism, unspecified: Secondary | ICD-10-CM | POA: Diagnosis not present

## 2023-02-13 DIAGNOSIS — G4733 Obstructive sleep apnea (adult) (pediatric): Secondary | ICD-10-CM | POA: Diagnosis not present

## 2023-02-13 DIAGNOSIS — I428 Other cardiomyopathies: Secondary | ICD-10-CM

## 2023-02-13 DIAGNOSIS — I5022 Chronic systolic (congestive) heart failure: Secondary | ICD-10-CM

## 2023-02-13 LAB — BASIC METABOLIC PANEL
Anion gap: 8 (ref 5–15)
BUN: 16 mg/dL (ref 6–20)
CO2: 25 mmol/L (ref 22–32)
Calcium: 9 mg/dL (ref 8.9–10.3)
Chloride: 103 mmol/L (ref 98–111)
Creatinine, Ser: 1.05 mg/dL (ref 0.61–1.24)
GFR, Estimated: >60 mL/min (ref >60)
Glucose, Bld: 102 mg/dL — ABNORMAL HIGH (ref 70–99)
Potassium: 3.9 mmol/L (ref 3.5–5.1)
Sodium: 136 mmol/L (ref 135–145)

## 2023-02-13 NOTE — Patient Instructions (Signed)
Medication Instructions:  Your physician recommends that you continue on your current medications as directed. Please refer to the Current Medication list given to you today.  *If you need a refill on your cardiac medications before your next appointment, please call your pharmacy*   Lab Work: Your physician recommends that you return for lab work in: Today Paramedic)   If you have labs (blood work) drawn today and your tests are completely normal, you will receive your results only by: MyChart Message (if you have MyChart) OR A paper copy in the mail If you have any lab test that is abnormal or we need to change your treatment, we will call you to review the results.   Testing/Procedures: NONE    Follow-Up: At [facility], you and your health needs are our priority.  As part of our continuing mission to provide you with exceptional heart care, we have created designated Provider Care Teams.  These Care Teams include your primary Cardiologist (physician) and Advanced Practice Providers (APPs -  Physician Assistants and Nurse Practitioners) who all work together to provide you with the care you need, when you need it.  We recommend signing up for the patient portal called "MyChart".  Sign up information is provided on this After Visit Summary.  MyChart is used to connect with patients for Virtual Visits (Telemedicine).  Patients are able to view lab/test results, encounter notes, upcoming appointments, etc.  Non-urgent messages can be sent to your provider as well.   To learn more about what you can do with MyChart, go to [email or web address].    Your next appointment:   3 month(s)  Provider:   You may see Carlyle Dolly, MD or one of the following Advanced Practice Providers on your designated Care Team:   Bernerd Pho, PA-C  Ermalinda Barrios, [place]     Other Instructions Thank you for choosing [place]!

## 2023-02-13 NOTE — Progress Notes (Signed)
Cardiology Office Note:    Date:  02/13/2023   ID:  Elijah Pulling., DOB Apr 09, 1966, MRN [id number]  PCP:  [facility]  [facility] Cardiologist:  Carlyle Dolly, MD  [facility] Electrophysiologist:  None   Referring MD: [facility]   Chief Complaint: 1 week follow-up  History of Present Illness:    Elijah Reynen. is a 57 y.o. male with a hx of reduced ejection fraction secondary to nonischemic cardiomyopathy, thyroid cancer status post thyroidectomy in 2017, history of COVID in 2023, GERD resents for follow-up.  Patient was admitted in January 2024 to [facility] with acute systolic heart failure.  He had a left heart cath that was unremarkable and right heart catheter showed preserved cardiac output.  He was started on low-dose GDMT and has since followed up with the advanced heart failure team.  At the most recent visit Entresto was started and metoprolol was increased.  Heart monitor was ordered to evaluate for tachyarrhythmias.  Cardiac MRI was also ordered.  Today, the patients overall doing well. He reports he has needed lasix twice since the last visit. He is wondering about getting back into the gym. He is also wondering about cardiac rehab.  Denies chest pain, shortness of breath, lower leg edema, orthopnea, PND.  No myopathy discussed in detail today.  MRI has not been scheduled.  Past Medical History:  Diagnosis Date   Cancer ([place])    Horner's syndrome     Past Surgical History:  Procedure Laterality Date   EYE SURGERY     RIGHT/LEFT HEART CATH AND CORONARY ANGIOGRAPHY N/A 01/03/2023   Procedure: RIGHT/LEFT HEART CATH AND CORONARY ANGIOGRAPHY;  Surgeon: Burnell Blanks, MD;  Location: [facility];  Service: Cardiovascular;  Laterality: N/A;   THYROIDECTOMY Bilateral     Current Medications: Current Meds  Medication Sig   allopurinol (ZYLOPRIM) 100 MG tablet Take 100 mg by mouth daily.   dapagliflozin propanediol  (FARXIGA) 10 MG TABS tablet Take 1 tablet (10 mg total) by mouth daily.   diphenhydrAMINE (BENADRYL) 25 mg capsule Take 25 mg by mouth every 6 (six) hours as needed for allergies or itching.   furosemide (LASIX) 40 MG tablet Take 1 tablet (40 mg total) by mouth daily as needed (weight gain (3lbs in 1 day or 5lbs in 1 week) or worsening swelling/edema).   levothyroxine (SYNTHROID) 200 MCG tablet Take 200 mcg by mouth daily before breakfast.   metoprolol succinate (TOPROL-XL) 25 MG 24 hr tablet Take 1 tablet (25 mg total) by mouth daily.   omeprazole (PRILOSEC) 20 MG capsule Take 20 mg by mouth daily.   potassium chloride SA (KLOR-CON M) 20 MEQ tablet Take 1 tablet (20 mEq total) by mouth daily as needed (when you need to take the Lasix).   sacubitril-valsartan (ENTRESTO) 24-26 MG Take 1 tablet by mouth 2 (two) times daily.   spironolactone (ALDACTONE) 25 MG tablet Take 1/2 tablet (12.5 mg total) by mouth daily.     Allergies:   Other and Shellfish allergy   Social History   Socioeconomic History   Marital status: Married    Spouse name: Elijah Perez   Number of children: 8   Years of education: Not on file   Highest education level: High school graduate  Occupational History   Occupation: [employer]  Tobacco Use   Smoking status: Former    Types: Cigarettes    Quit date: 1996    Years since quitting: 28.1  Smokeless tobacco: Never  Vaping Use   Vaping Use: Never used  Substance and Sexual Activity   Alcohol use: Not Currently    Alcohol/week: 2.0 standard drinks of alcohol    Types: 2 Glasses of wine per week   Drug use: Not Currently   Sexual activity: Not on file  Other Topics Concern   Not on file  Social History Narrative   Not on file   Social Determinants of Health   Financial Resource Strain: Low Risk  (01/04/2023)   Overall Financial Resource Strain (CARDIA)    Difficulty of Paying Living Expenses: Not very hard  Food Insecurity: No Food Insecurity (01/01/2023)   Hunger  Vital Sign    Worried About Running Out of Food in the Last Year: Never true    Ran Out of Food in the Last Year: Never true  Transportation Needs: No Transportation Needs (01/01/2023)   PRAPARE - Hydrologist (Medical): No    Lack of Transportation (Non-Medical): No  Physical Activity: Not on file  Stress: Not on file  Social Connections: Not on file     Family History: The patient's family history includes Atrial fibrillation in his brother; Heart murmur in his brother; Stroke in his father; Valvular heart disease in his mother.  ROS:   Please see the history of present illness.     All other systems reviewed and are negative.  EKGs/Labs/Other Studies Reviewed:    The following studies were reviewed today:  Cardiac Cath 12/2022 RIGHT/LEFT HEART CATH AND CORONARY ANGIOGRAPHY   Conclusion  No angiographic evidence of CAD Normal right and left heart pressures (RA 1, RV 22/0/2, PA 23/5 mean 12, PCWP 7, LV 102/3/9, AO 108/71, CO 4.97 L/min, CI 2.22)   Recommendations: Medical management of non-ischemic cardiomyopathy.   Echo 12/2022 1. Left ventricular ejection fraction, by estimation, is 30 to 35%. The  left ventricle has moderately decreased function. The left ventricle  demonstrates global hypokinesis. The left ventricular internal cavity size  was mildly dilated. Left ventricular  diastolic parameters are indeterminate.   2. Right ventricular systolic function is normal. The right ventricular  size is normal.   3. Left atrial size was severely dilated.   4. Right atrial size was mildly dilated.   5. The mitral valve is abnormal. Mild mitral valve regurgitation. No  evidence of mitral stenosis.   6. The aortic valve is tricuspid. There is mild calcification of the  aortic valve. There is mild thickening of the aortic valve. Aortic valve  regurgitation is mild. No aortic stenosis is present.   7. The inferior vena cava is normal in size with  greater than 50%  respiratory variability, suggesting right atrial pressure of 3 mmHg.    EKG:  EKG is not ordered today.    Recent Labs: 01/02/2023: ALT 22; Magnesium 1.9 01/03/2023: TSH 1.629 01/04/2023: Hemoglobin 14.6; Platelets 211 02/04/2023: B Natriuretic Peptide 71.3; BUN 8; Creatinine, Ser 0.97; Potassium 4.1; Sodium 135  Recent Lipid Panel    Component Value Date/Time   CHOL 209 (H) 01/03/2023 0411   TRIG 147 01/03/2023 0411   HDL 99 01/03/2023 0411   CHOLHDL 2.1 01/03/2023 0411   VLDL 29 01/03/2023 0411   LDLCALC 81 01/03/2023 0411      Physical Exam:    VS:  BP 110/62   Pulse 71   Ht '5\' 11"'$  (1.803 m)   Wt 232 lb (105.2 kg)   SpO2 99%   BMI 32.36  kg/m     Wt Readings from Last 3 Encounters:  02/13/23 232 lb (105.2 kg)  02/04/23 233 lb 12.8 oz (106.1 kg)  01/11/23 234 lb 6.4 oz (106.3 kg)     GEN:  Well nourished, well developed in no acute distress HEENT: Normal NECK: No JVD; No carotid bruits LYMPHATICS: No lymphadenopathy CARDIAC: RRR, no murmurs, rubs, gallops RESPIRATORY:  Clear to auscultation without rales, wheezing or rhonchi  ABDOMEN: Soft, non-tender, non-distended MUSCULOSKELETAL:  No edema; No deformity  SKIN: Warm and dry NEUROLOGIC:  Alert and oriented x 3 PSYCHIATRIC:  Normal affect   ASSESSMENT:    1. Non-ischemic cardiomyopathy ([place])   2. Chronic systolic heart failure ([place])   3. Acquired hypothyroidism   4. OSA (obstructive sleep apnea)    PLAN:    In order of problems listed above:  HFrEF NICM Admission in January found to have systolic heart failure. LHC showed no CAD, unclear etiology of CM. Patient saw the Advanced heart failure team twice since discharge.  Denies alcohol or drug history. Heart monitor was ordered to look for tachyarrhythmias, this was just mailed in yesterday.  cMRI was so ordered but has not been scheduled, so we will look into this. He has needed PRN lasix twice since the last visit. He appears euvolemic  on exam. He was started on Entresto at the last visit, so I will check a BMET today. Continue Entresto 24/26 mg BID, spironolactone 12.5 mg daily, Toprol 5 mg daily and Farxiga milligrams daily.  We will check with ACHF in regards to follow-up. Cath site is stable. I advised he can slowly get back into activity. I will place a referral to cardiac rehab.  Plan to recheck an echo after 2 to 3 months on GDMT.  Thyroid cancer s/p thyroidectomy Patient is on levothyroxine 200 mcg daily.  He is status post radiation and radioactive iodine.    OSA  He reports mild OSA not on CPAP  Disposition: Follow up in 1 month(s) with heart failure clinic     Signed, Nidya Bouyer Arlyss Repress  02/13/2023 3:22 PM    [place]

## 2023-03-13 ENCOUNTER — Other Ambulatory Visit (HOSPITAL_COMMUNITY): Payer: Self-pay

## 2023-03-13 ENCOUNTER — Other Ambulatory Visit: Payer: Self-pay

## 2023-03-18 ENCOUNTER — Encounter (HOSPITAL_COMMUNITY): Payer: Self-pay | Admitting: Cardiology

## 2023-03-18 ENCOUNTER — Ambulatory Visit (HOSPITAL_BASED_OUTPATIENT_CLINIC_OR_DEPARTMENT_OTHER)
Admission: RE | Admit: 2023-03-18 | Discharge: 2023-03-18 | Disposition: A | Discharge status: Home / Self Care | Payer: No Typology Code available for payment source | Source: Ambulatory Visit | Attending: Cardiology | Admitting: Cardiology

## 2023-03-18 ENCOUNTER — Other Ambulatory Visit (HOSPITAL_COMMUNITY): Payer: Self-pay

## 2023-03-18 ENCOUNTER — Ambulatory Visit (HOSPITAL_COMMUNITY)
Admission: RE | Admit: 2023-03-18 | Discharge: 2023-03-18 | Disposition: A | Discharge status: Home / Self Care | Payer: No Typology Code available for payment source | Source: Ambulatory Visit | Attending: Cardiology | Admitting: Cardiology

## 2023-03-18 VITALS — BP 118/80 | HR 61 | Wt 226.4 lb

## 2023-03-18 DIAGNOSIS — I5022 Chronic systolic (congestive) heart failure: Secondary | ICD-10-CM | POA: Diagnosis not present

## 2023-03-18 DIAGNOSIS — Z923 Personal history of irradiation: Secondary | ICD-10-CM | POA: Diagnosis not present

## 2023-03-18 DIAGNOSIS — K219 Gastro-esophageal reflux disease without esophagitis: Secondary | ICD-10-CM | POA: Insufficient documentation

## 2023-03-18 DIAGNOSIS — C73 Malignant neoplasm of thyroid gland: Secondary | ICD-10-CM | POA: Diagnosis not present

## 2023-03-18 DIAGNOSIS — I080 Rheumatic disorders of both mitral and aortic valves: Secondary | ICD-10-CM | POA: Diagnosis not present

## 2023-03-18 DIAGNOSIS — R5383 Other fatigue: Secondary | ICD-10-CM | POA: Insufficient documentation

## 2023-03-18 DIAGNOSIS — Z85850 Personal history of malignant neoplasm of thyroid: Secondary | ICD-10-CM | POA: Insufficient documentation

## 2023-03-18 DIAGNOSIS — G4730 Sleep apnea, unspecified: Secondary | ICD-10-CM | POA: Diagnosis not present

## 2023-03-18 DIAGNOSIS — Z79890 Hormone replacement therapy (postmenopausal): Secondary | ICD-10-CM | POA: Insufficient documentation

## 2023-03-18 DIAGNOSIS — R002 Palpitations: Secondary | ICD-10-CM | POA: Insufficient documentation

## 2023-03-18 DIAGNOSIS — Z8616 Personal history of COVID-19: Secondary | ICD-10-CM | POA: Insufficient documentation

## 2023-03-18 DIAGNOSIS — I428 Other cardiomyopathies: Secondary | ICD-10-CM | POA: Insufficient documentation

## 2023-03-18 DIAGNOSIS — E890 Postprocedural hypothyroidism: Secondary | ICD-10-CM | POA: Diagnosis not present

## 2023-03-18 LAB — BASIC METABOLIC PANEL
Anion gap: 11 (ref 5–15)
BUN: 8 mg/dL (ref 6–20)
CO2: 27 mmol/L (ref 22–32)
Calcium: 9.6 mg/dL (ref 8.9–10.3)
Chloride: 100 mmol/L (ref 98–111)
Creatinine, Ser: 1.16 mg/dL (ref 0.61–1.24)
GFR, Estimated: >60 mL/min (ref >60)
Glucose, Bld: 95 mg/dL (ref 70–99)
Potassium: 4.5 mmol/L (ref 3.5–5.1)
Sodium: 138 mmol/L (ref 135–145)

## 2023-03-18 LAB — ECHOCARDIOGRAM COMPLETE
Area-P 1/2: 3.93 cm2
Calc EF: 35.6 %
S' Lateral: 5.3 cm
Single Plane A2C EF: 36 %
Single Plane A4C EF: 36.3 %

## 2023-03-18 LAB — BRAIN NATRIURETIC PEPTIDE: B Natriuretic Peptide: 93.6 pg/mL (ref 0.0–100.0)

## 2023-03-18 NOTE — Progress Notes (Signed)
ADVANCED HEART FAILURE CLINIC NOTE  Referring Physician: [facility]  Primary Care: [facility] Primary Cardiologist: Dr. Scarlette Calico  HPI: Elijah Zara. is a 57 y.o. male with heart failure with reduced ejection fraction secondary to nonischemic cardiomyopathy, thyroid cancer status post thyroidectomy in 2017, history of COVID-19 in 2023, GERD presenting today to establish care.  Elijah Perez cardiac history dates back to January 01, 2023 when he was admitted to [facility] with acute systolic heart failure.  He had a left heart cath that was unremarkable and right heart cath that demonstrated preserved cardiac output.  He was started on low-dose GDMT and since that time is followed up in [facility] clinic where GDMT was further uptitrated. He ran a 5K in November and although he completed it he felt much more fatigued than usual. Prior to this he could run a 76m mile. No supplements for exercise other than protein.   Interval history Since his last visit, Elijah Perez has remained very strict with dietary intake and sodium intake. He monitors weights daily. His dry weight appears to be around 223lbs. From a functional standpoint, he only short of breath when walking up flights of stairs. He has not started running yet but is walking roughly 1 mile daily.  After 1 mile he becomes fatigued & short of breath.   Activity level/exercise tolerance: NYHA II Orthopnea:  Sleeps flat Paroxysmal noctural dyspnea:  No Chest pain/pressure:  No Orthostatic lightheadedness:  No Palpitations: improved significantly.  Lower extremity edema:  No Presyncope/syncope:  No Cough:  No  Past Medical History:  Diagnosis Date   Cancer    Horner's syndrome     Current Outpatient Medications  Medication Sig Dispense Refill   allopurinol (ZYLOPRIM) 100 MG tablet Take 100 mg by mouth daily.     dapagliflozin propanediol (FARXIGA) 10 MG TABS tablet Take 1 tablet (10 mg total) by mouth  daily. 30 tablet 11   diphenhydrAMINE (BENADRYL) 25 mg capsule Take 25 mg by mouth every 6 (six) hours as needed for allergies or itching.     furosemide (LASIX) 40 MG tablet Take 1 tablet (40 mg total) by mouth daily as needed (weight gain (3lbs in 1 day or 5lbs in 1 week) or worsening swelling/edema). 30 tablet 1   levothyroxine (SYNTHROID) 200 MCG tablet Take 200 mcg by mouth daily before breakfast.     metoprolol succinate (TOPROL-XL) 25 MG 24 hr tablet Take 1 tablet (25 mg total) by mouth daily. 90 tablet 3   omeprazole (PRILOSEC) 20 MG capsule Take 20 mg by mouth daily.     potassium chloride SA (KLOR-CON M) 20 MEQ tablet Take 1 tablet (20 mEq total) by mouth daily as needed (when you need to take the Lasix). 30 tablet 1   sacubitril-valsartan (ENTRESTO) 24-26 MG Take 1 tablet by mouth 2 (two) times daily. 60 tablet 11   spironolactone (ALDACTONE) 25 MG tablet Take 1/2 tablet (12.5 mg total) by mouth daily. 45 tablet 3   No current facility-administered medications for this encounter.    Allergies  Allergen Reactions   Other Other (See Comments)    Pet dander, pollen -> eyes itchy/watery, sneezing   Shellfish Allergy Itching      Social History   Socioeconomic History   Marital status: Married    Spouse name: Emmily   Number of children: 8   Years of education: Not on file   Highest education level: High school graduate  Occupational History  Occupation: [employer]  Tobacco Use   Smoking status: Former    Types: Cigarettes    Quit date: 1996    Years since quitting: 28.2   Smokeless tobacco: Never  Vaping Use   Vaping Use: Never used  Substance and Sexual Activity   Alcohol use: Not Currently    Alcohol/week: 2.0 standard drinks of alcohol    Types: 2 Glasses of wine per week   Drug use: Not Currently   Sexual activity: Not on file  Other Topics Concern   Not on file  Social History Narrative   Not on file   Social Determinants of Health   Financial Resource  Strain: Low Risk  (01/04/2023)   Overall Financial Resource Strain (CARDIA)    Difficulty of Paying Living Expenses: Not very hard  Food Insecurity: No Food Insecurity (01/01/2023)   Hunger Vital Sign    Worried About Running Out of Food in the Last Year: Never true    Ran Out of Food in the Last Year: Never true  Transportation Needs: No Transportation Needs (01/01/2023)   PRAPARE - Hydrologist (Medical): No    Lack of Transportation (Non-Medical): No  Physical Activity: Not on file  Stress: Not on file  Social Connections: Not on file  Intimate Partner Violence: Not At Risk (01/01/2023)   Humiliation, Afraid, Rape, and Kick questionnaire    Fear of Current or Ex-Partner: No    Emotionally Abused: No    Physically Abused: No    Sexually Abused: No      Family History  Problem Relation Age of Onset   Valvular heart disease Mother    Stroke Father    Heart murmur Brother    Atrial fibrillation Brother     PHYSICAL EXAM: Vitals:   03/18/23 1045  BP: 118/80  Pulse: 61  SpO2: 98%   GENERAL: Well nourished, well developed, and in no apparent distress at rest.  HEENT: Negative for arcus senilis or xanthelasma. There is no scleral icterus.  The mucous membranes are pink and moist.   NECK: Supple, No masses. Normal carotid upstrokes without bruits. No masses or thyromegaly.    CHEST: There are no chest wall deformities. There is no chest wall tenderness. Respirations are unlabored.  Lungs-CTA bilaterally CARDIAC:  JVP: 7 cm          Normal rate with regular rhythm. No murmurs, rubs or gallops.  Pulses are 2+ and symmetrical in upper and lower extremities.  no edema.  ABDOMEN: Soft, non-tender, non-distended. There are no masses or hepatomegaly. There are normal bowel sounds.  EXTREMITIES: Warm and well perfused with no cyanosis, clubbing.  LYMPHATIC: No axillary or supraclavicular lymphadenopathy.  NEUROLOGIC: Patient is oriented x3 with no focal or  lateralizing neurologic deficits.  PSYCH: Patients affect is appropriate, there is no evidence of anxiety or depression.  SKIN: Warm and dry; no lesions or wounds.   DATA REVIEW  ECG: 01/11/23: NSR with PVC.   ECHO: 01/01/23: LVEF 30 to 35% with global hypokinesis.  Normal RV function.  Severely dilated left atrium. 03/18/2023: LVEF 35% with global hypokinesis  CATH: 01/03/23: No angiographic evidence of CAD Normal right and left heart pressures (RA 1, RV 22/0/2, PA 23/5 mean 12, PCWP 7, LV 102/3/9, AO 108/71, CO 4.97 L/min, CI 2.22)   ASSESSMENT & PLAN:  Heart failure with reduced ejection fraction Etiology of HF: Nonischemic cardiomyopathy, left heart cath as demonstrated above with no evidence of CAD. Reports  frequent palpitations prior to dx of HFrEF, enlarged LA on TTE. Ziopatch without r tachyarrhythmias. Otherwise, no significant FH. Has lifted heavy weights for years; however, no reported use of anabolic steroids or other products aside from protein/creatine.  NYHA class / AHA Stage:NYHA II Volume status & Diuretics: euvolemic; has not required lasix for several weeks now.  Vasodilators: Has been on entresto 24/26 for at least 1 month now.  Will continue for the time being.  Plan for follow-up with pharmacy for up titration. Beta-Blocker: continue toprol 25mg  daily;  MRA: continue spironolactone 12.5mg   Cardiometabolic:continue jardiance Devices therapies & Valvulopathies:not indicated Advanced therapies:not indicated.   2. Thyroid Cancer S/P thyroidectomy  - Recently reduced levothyroxine dose, now on 265mcg daily.  - S/P radiation and radioactive iodone.  - No chemotherapy.   3. Sleep Apnea - reports that it was very mild and CPAP was not advised.   Personally reviewed echocardiogram, ECGs, imaging; reviewed this with patient.   Elijah Perez Advanced Heart Failure Mechanical Circulatory Support

## 2023-03-18 NOTE — Progress Notes (Signed)
Echocardiogram 2D Echocardiogram has been performed.  Frances Furbish 03/18/2023, 10:39 AM

## 2023-03-18 NOTE — Patient Instructions (Signed)
There has been no changes to your medications.  Labs done today, your results will be available in MyChart, we will contact you for abnormal readings.  Your physician has requested that you have an echocardiogram. Echocardiography is a painless test that uses sound waves to create images of your heart. It provides your doctor with information about the size and shape of your heart and how well your heart's chambers and valves are working. This procedure takes approximately one hour. There are no restrictions for this procedure. Please do NOT wear cologne, perfume, aftershave, or lotions (deodorant is allowed). Please arrive 15 minutes prior to your appointment time.  Please follow up with our heart failure pharmacist in 4 weeks  Your physician recommends that you schedule a follow-up appointment in: 3 months with an echocardiogram (July) ** please call the office in May to arrange your follow up appointment. **  If you have any questions or concerns before your next appointment please send [place] a message through [place] or call our office at [phone].    TO LEAVE A MESSAGE FOR THE NURSE SELECT OPTION 2, PLEASE LEAVE A MESSAGE INCLUDING: YOUR NAME DATE OF BIRTH CALL BACK NUMBER REASON FOR CALL**this is important as we prioritize the call backs  YOU WILL RECEIVE A CALL BACK THE SAME DAY AS LONG AS YOU CALL BEFORE 4:00 PM  At the [facility], you and your health needs are our priority. As part of our continuing mission to provide you with exceptional heart care, we have created designated Provider Care Teams. These Care Teams include your primary Cardiologist (physician) and Advanced Practice Providers (APPs- Physician Assistants and Nurse Practitioners) who all work together to provide you with the care you need, when you need it.   You may see any of the following providers on your designated Care Team at your next follow up: Dr Glori Bickers Dr Loralie Champagne Dr.  Roxana Hires, NP Lyda Jester, [place] [facility] [place], [place] Forestine Na, NP Audry Riles, PharmD   Please be sure to bring in all your medications bottles to every appointment.    Thank you for choosing [facility]

## 2023-03-26 ENCOUNTER — Other Ambulatory Visit (HOSPITAL_COMMUNITY): Payer: Self-pay

## 2023-04-02 ENCOUNTER — Other Ambulatory Visit (HOSPITAL_COMMUNITY): Payer: Self-pay

## 2023-04-09 ENCOUNTER — Other Ambulatory Visit: Payer: Self-pay

## 2023-04-13 ENCOUNTER — Other Ambulatory Visit (HOSPITAL_COMMUNITY): Payer: Self-pay

## 2023-04-15 ENCOUNTER — Other Ambulatory Visit (HOSPITAL_COMMUNITY): Payer: Self-pay

## 2023-04-15 ENCOUNTER — Ambulatory Visit (HOSPITAL_COMMUNITY)
Admission: RE | Admit: 2023-04-15 | Discharge: 2023-04-15 | Disposition: A | Discharge status: Home / Self Care | Payer: No Typology Code available for payment source | Source: Ambulatory Visit | Attending: Cardiology | Admitting: Cardiology

## 2023-04-15 VITALS — BP 118/76 | HR 70 | Wt 224.0 lb

## 2023-04-15 DIAGNOSIS — I5022 Chronic systolic (congestive) heart failure: Secondary | ICD-10-CM

## 2023-04-15 DIAGNOSIS — I428 Other cardiomyopathies: Secondary | ICD-10-CM | POA: Diagnosis not present

## 2023-04-15 DIAGNOSIS — I5020 Unspecified systolic (congestive) heart failure: Secondary | ICD-10-CM | POA: Insufficient documentation

## 2023-04-15 DIAGNOSIS — C7989 Secondary malignant neoplasm of other specified sites: Secondary | ICD-10-CM | POA: Diagnosis not present

## 2023-04-15 DIAGNOSIS — G4730 Sleep apnea, unspecified: Secondary | ICD-10-CM | POA: Diagnosis not present

## 2023-04-15 MED ORDER — ENTRESTO 49-51 MG PO TABS
1.0000 | ORAL_TABLET | Freq: Two times a day (BID) | ORAL | 6 refills | Status: DC
  Filled 2023-04-15: qty 60, 30d supply, fill #0
  Filled 2023-05-14 – 2023-06-16 (×3): qty 60, 30d supply, fill #1
  Filled 2023-07-11 – 2023-08-06 (×2): qty 60, 30d supply, fill #2
  Filled 2023-09-02 (×2): qty 60, 30d supply, fill #3
  Filled 2023-09-15 – 2023-10-02 (×4): qty 60, 30d supply, fill #4
  Filled 2023-10-29: qty 60, 30d supply, fill #5
  Filled 2023-12-09: qty 60, 30d supply, fill #6
  Filled ????-??-?? (×2): fill #4

## 2023-04-15 MED ORDER — ENTRESTO 49-51 MG PO TABS
1.0000 | ORAL_TABLET | Freq: Two times a day (BID) | ORAL | 6 refills | Status: DC
  Filled 2023-04-15: qty 60, 30d supply, fill #0

## 2023-04-15 MED ORDER — SPIRONOLACTONE 25 MG PO TABS: 25.0000 mg | ORAL_TABLET | Freq: Every day | ORAL | 6 refills | Status: DC

## 2023-04-15 MED ORDER — SPIRONOLACTONE 25 MG PO TABS
25.0000 mg | ORAL_TABLET | Freq: Every day | ORAL | 6 refills | Status: DC
  Filled 2023-04-15: qty 30, 30d supply, fill #0

## 2023-04-15 MED ORDER — ENTRESTO 49-51 MG PO TABS: 1.0000 | ORAL_TABLET | Freq: Two times a day (BID) | ORAL | 6 refills | Status: DC

## 2023-04-15 MED ORDER — SPIRONOLACTONE 25 MG PO TABS
25.0000 mg | ORAL_TABLET | Freq: Every day | ORAL | 6 refills | Status: DC
  Filled 2023-04-15 (×2): qty 30, 30d supply, fill #0
  Filled 2023-05-14 – 2023-06-16 (×2): qty 30, 30d supply, fill #1
  Filled 2023-07-11: qty 30, 30d supply, fill #2
  Filled 2023-08-15 – 2023-09-02 (×3): qty 30, 30d supply, fill #3
  Filled 2023-09-25: qty 30, 30d supply, fill #4
  Filled 2023-10-02 – 2023-10-29 (×2): qty 30, 30d supply, fill #5
  Filled 2023-11-24: qty 30, 30d supply, fill #6

## 2023-04-15 NOTE — Progress Notes (Signed)
Advanced Heart Failure Clinic Note   Primary Care: [facility] Primary Cardiologist: Dr. Isabel Caprice Primary HF Physician: Dr. Gasper Lloyd  HPI:  Elijah Perez. is a 57 y.o. male with heart failure with reduced ejection fraction secondary to nonischemic cardiomyopathy, thyroid cancer status post thyroidectomy in 2017, history of COVID-19 in 2023, GERD.  Elijah Perez cardiac history dates back to January 2024 when he was admitted to [facility] with acute systolic heart failure.  He had a left heart cath that was unremarkable and right heart cath that demonstrated preserved cardiac output.  Echo showed EF of 30-35% with global hypokinesis. He was started on low-dose GDMT and since that time is followed up in [facility] clinic where GDMT was further uptitrated. He ran a 5K in November and although he completed it he felt much more fatigued than usual. Prior to this he could run a 30m mile. No supplements for exercise other than protein.    He returned to ADHF clinic on 03/18/23. Reported that he is very strict with dietary intake and sodium intake. Dry weight was around 223lbs. From a functional standpoint, he reported SOB only when walking up a flights of stairs. Was walking a mile daily with reported SOB and fatigue.  Denied orthopnea/PND, chest pain, lightheadedness, or LEE. Palpitations had improved significantly at that time.   Today he returns to HF clinic for pharmacist medication titration. At last visit with MD, no medication changes were made. Overall he has been feeling a little depressed with all of the recent health changes and activity limitations. Denies dizziness and lightheadedness. Reports some fatigue. Denies chest pain or palpitations. Does report some chest soreness,likely from physical activity. He reports improvements in SOB. He walks one mile a day, however is SOB with any further distances and with walking up the stairs. Weight at home 217-218 pounds. Takes furosemide  40 mg PRN. He took this once last week when he noticed some weight gain (3 lbs in a day). No LEE on exam. No PND/orthopnea. Uses 2 pillows at night. Appetite has been good, however limited intake due to tooth pain. He had a root canal completed earlier this morning. He is strictly adhering to a low salt diet. He will pick up some medications through the [facility] pharmacy and some medications through [facility] depending on access at the time. [facility] 12.5 [facility] daily [facility] him, however he has not been taking this - has been taking [place] as listed on his medication list.  HF Medications: Metoprolol succinate 25 mg daily (1/2 of a 50 mg tablet) Entresto 24/26 mg BID Spironolactone 12.5 mg daily Farxiga 10 mg daily Furosemide 40 mg PRN Potassium chloride 20 mEq PRN with furosemide  Has the patient been experiencing any side effects to the medications prescribed?  No  Does the patient have any problems obtaining medications due to transportation or finances?   [facility] Insurance; has copay card for [employer] when he fills through [employer]. Between fills at [facility] and the [facility], he has had adequate medication supply.  Understanding of regimen: excellent Understanding of indications: good Potential of compliance: excellent Patient understands to avoid NSAIDs. Patient understands to avoid decongestants.    Pertinent Lab Values: 03/18/23: Serum creatinine 1.16, BUN 8, Potassium 4.5, Sodium 138, BNP 93.6  Vital Signs: Weight: 224 lbs (last clinic weight: 226 lb) Blood pressure: 118/76  Heart rate: 70   Assessment/Plan: Heart failure with reduced  ejection fraction Etiology of HF: Nonischemic cardiomyopathy, left heart cath has demonstrated no evidence of CAD. Reports frequent palpitations prior to dx of HFrEF; enlarged LA on TTE. Ziopatch with no tachyarrhythmias. No significant FH. Has lifted heavy weights for years; however, no reported  use of anabolic steroids or other products aside from protein/creatine.  NYHA class / AHA Stage: NYHA II Volume status & Diuretics: euvolemic on exam Vasodilators: increase Entresto to 49/51 mg BID Beta-Blocker: continue metoprolol succinate 25 mg daily MRA: increase spironolactone to 25 mg daily Cardiometabolic: continue Farxiga 10 mg daily Devices therapies & Valvulopathies: not indicated Advanced therapies: not indicated.    2. Thyroid Cancer S/P thyroidectomy  - Recently reduced levothyroxine daily.  - S/P radiation and radioactive iodone.  - No chemotherapy.    3. Sleep Apnea -  Previously reported that it was very mild and CPAP was not advised.   Follow up in 2 weeks with ADHF pharmacy clinic for medication titration and BMET.  Irish Elders, PharmD PGY-1 [facility] Pharmacy Resident  Karle Plumber, PharmD, BCPS, [facility], CPP Heart Failure Clinic Pharmacist [phone]

## 2023-04-15 NOTE — Patient Instructions (Signed)
It was a pleasure seeing you today!  MEDICATIONS: -We are changing your medications today -Increase spironolactone to 25 mg daily -Increase Entresto to 49/51 mg twice daily -Call if you have questions about your medications.  NEXT APPOINTMENT: Return to clinic in 2 weeks with pharmacy team.  In general, to take care of your heart failure: -Limit your fluid intake to 2 Liters (half-gallon) per day.   -Limit your salt intake to ideally 2-3 grams (2000-3000 mg) per day. -Weigh yourself daily and record, and bring that "weight diary" to your next appointment.  (Weight gain of 2-3 pounds in 1 day typically means fluid weight.) -The medications for your heart are to help your heart and help you live longer.   -Please contact us before stopping any of your heart medications.  Call the clinic at [phone] with questions or to reschedule future appointments.

## 2023-04-25 ENCOUNTER — Encounter (HOSPITAL_COMMUNITY): Payer: Self-pay | Admitting: Cardiology

## 2023-04-29 ENCOUNTER — Telehealth (HOSPITAL_COMMUNITY): Payer: Self-pay | Admitting: Emergency Medicine

## 2023-04-29 DIAGNOSIS — F40240 Claustrophobia: Secondary | ICD-10-CM

## 2023-04-29 MED ORDER — DIAZEPAM 5 MG PO TABS
5.0000 mg | ORAL_TABLET | Freq: Once | ORAL | 0 refills | Status: AC
Start: 2023-04-29 — End: 2023-04-29

## 2023-04-29 NOTE — Telephone Encounter (Signed)
Reaching out to patient to offer assistance regarding upcoming cardiac imaging study; pt verbalizes understanding of appt date/time, parking situation and where to check in, pre-test NPO status and medications ordered, and verified current allergies; name and call back number provided for further questions should they arise Rockwell Alexandria RN Navigator Cardiac Imaging [facility] [phone] office [phone] cell  5mg  valium #1 no refills called into pharm on file. [place] Dec

## 2023-04-30 ENCOUNTER — Other Ambulatory Visit (HOSPITAL_COMMUNITY): Payer: Self-pay | Admitting: Cardiology

## 2023-04-30 ENCOUNTER — Ambulatory Visit (HOSPITAL_COMMUNITY)
Admission: RE | Admit: 2023-04-30 | Discharge: 2023-04-30 | Disposition: A | Discharge status: Home / Self Care | Payer: No Typology Code available for payment source | Source: Ambulatory Visit | Attending: Cardiology | Admitting: Cardiology

## 2023-04-30 ENCOUNTER — Inpatient Hospital Stay (HOSPITAL_COMMUNITY)
Admission: RE | Admit: 2023-04-30 | Discharge: 2023-04-30 | Disposition: A | Discharge status: Home / Self Care | Payer: No Typology Code available for payment source | Source: Ambulatory Visit

## 2023-04-30 DIAGNOSIS — I428 Other cardiomyopathies: Secondary | ICD-10-CM

## 2023-04-30 MED ORDER — GADOBUTROL 1 MMOL/ML IV SOLN
10.0000 mL | Freq: Once | INTRAVENOUS | Status: AC | PRN
  Administered 2023-04-30: 10 mL via INTRAVENOUS

## 2023-05-15 NOTE — Progress Notes (Unsigned)
Cardiology Office Note    Date:  05/16/2023  ID:  Elijah Box., DOB 1966-02-02, MRN [id number] Cardiologist: Dina Rich, MD    History of Present Illness:    Elijah Argenti. is a 57 y.o. male with past medical history of HFrEF (EF 30-35% in 12/2022 with cath showing no evidence of CAD), thyroid cancer (s/p thyroidectomy in 2017) and GERD who presents to the office today for 22-month follow-up.   He was last examined by Dr. Gasper Lloyd in 03/2023 and reported that his weight had been stable at 223 lbs and he reported some dyspnea when walking up flights of stairs but denied any acute changes in his respiratory status. He appeared euvolemic on examination and was continued on Entresto, Toprol-XL, Spironolactone and Jardiance. He did follow-up with pharmacy on 04/15/2023 and Sherryll Burger was titrated to 49-51 mg twice daily and Spironolactone 25 mg daily. He was taking [place] instead of Jardiance and was continued on this. He was supposed to have follow-up labs in 2 weeks but these have not yet been obtained. He did have a cMRI earlier this month which showed his EF was at 35% and RV function was normal with EF at 50%. He did have RV insertion site LGE and nonspecific scar pattern which was often noted in the setting of elevated pulmonary pressures.  In talking with the patient today, he reports things have overall been stable since his last office visit. Says that his breathing has significantly improved since hospitalization in 12/2022. Denies any specific dyspnea on exertion, orthopnea, PND or pitting edema. Has only utilized Lasix once in the past 6-8 weeks. He has been walking a mile several days each week and denies any symptoms with this. Was previously having more shortness of breath and fatigue if walking for longer distances. No recent chest pain or palpitations. He does report having worsening dizziness with positional changes after Entresto and Spironolactone were titrated at his last  visit but says this has improved. He also reports a decrease in his libido and feels like this is likely due to his beta-blocker.   Studies Reviewed:   EKG: EKG is not ordered today.   Echocardiogram: 12/2022 IMPRESSIONS     1. Left ventricular ejection fraction, by estimation, is 30 to 35%. The  left ventricle has moderately decreased function. The left ventricle  demonstrates global hypokinesis. The left ventricular internal cavity size  was mildly dilated. Left ventricular  diastolic parameters are indeterminate.   2. Right ventricular systolic function is normal. The right ventricular  size is normal.   3. Left atrial size was severely dilated.   4. Right atrial size was mildly dilated.   5. The mitral valve is abnormal. Mild mitral valve regurgitation. No  evidence of mitral stenosis.   6. The aortic valve is tricuspid. There is mild calcification of the  aortic valve. There is mild thickening of the aortic valve. Aortic valve  regurgitation is mild. No aortic stenosis is present.   7. The inferior vena cava is normal in size with greater than 50%  respiratory variability, suggesting right atrial pressure of 3 mmHg.   R/LHC: 12/2022 No angiographic evidence of CAD Normal right and left heart pressures (RA 1, RV 22/0/2, PA 23/5 mean 12, PCWP 7, LV 102/3/9, AO 108/71, CO 4.97 L/min, CI 2.22)   Recommendations: Medical management of non-ischemic cardiomyopathy.    Echocardiogram: 03/18/2023 IMPRESSIONS     1. Global hypokinesis worse in septum and apex. Left ventricular ejection  fraction, by estimation, is 30 to 35%. The left ventricle has moderately  decreased function. The left ventricle demonstrates global hypokinesis.  The left ventricular internal  cavity size was moderately dilated. Left ventricular diastolic parameters  were normal.   2. Right ventricular systolic function is normal. The right ventricular  size is normal.   3. Left atrial size was moderately  dilated.   4. The mitral valve is normal in structure. Mild mitral valve  regurgitation. No evidence of mitral stenosis.   5. The aortic valve is tricuspid. There is mild calcification of the  aortic valve. Aortic valve regurgitation is mild. Aortic valve sclerosis  is present, with no evidence of aortic valve stenosis.   6. Aortic dilatation noted. There is mild dilatation of the ascending  aorta, measuring 39 mm.   7. The inferior vena cava is normal in size with greater than 50%  respiratory variability, suggesting right atrial pressure of 3 mmHg.    cMRI: 04/2023 IMPRESSION: 1.  Mild LV dilatation with moderate systolic dysfunction (EF 35%)   2.  Normal RV size and systolic dysfunction (EF 50%)   3. RV insertion site LGE, which is a nonspecific scar pattern often seen in setting of elevated pulmonary pressures   Physical Exam:   VS:  BP 126/78   Pulse 72   Ht 5\' 11"  (1.803 m)   Wt 223 lb (101.2 kg)   SpO2 99%   BMI 31.10 kg/m    Wt Readings from Last 3 Encounters:  05/16/23 223 lb (101.2 kg)  04/15/23 224 lb (101.6 kg)  03/18/23 226 lb 6.4 oz (102.7 kg)     GEN: Well nourished, well developed in no acute distress NECK: No JVD; No carotid bruits CARDIAC: RRR, no murmurs, rubs, gallops RESPIRATORY:  Clear to auscultation without rales, wheezing or rhonchi  ABDOMEN: Appears non-distended. No obvious abdominal masses. EXTREMITIES: No clubbing or cyanosis. No pitting edema.  Distal pedal pulses are 2+ bilaterally.   Assessment and Plan:   1. Chronic HFrEF/NICM - He has a known reduced EF of 30-35% with cath in 12/2022 showing no evidence of CAD. He appears euvolemic by examination today. Given his episodes of dizziness concerning for orthostasis following recent medication adjustments, I am hesitant to further titrate medical therapy at this time as I doubt he would tolerate Entresto 97-103 mg twice daily. Will recheck a BMET today for reassessment of his electrolytes  and renal function as this was not obtained following recent medication changes. - He is currently taking Entresto 49-51 mg twice daily, Farxiga 10 mg daily, Toprol-XL 25 mg daily and Spironolactone 25 mg daily. He reports a significant decrease in his libido and feels like this is due to his beta-blocker. Will try reducing Toprol-XL to 12.5 mg daily to see if this helps with symptoms. If no improvement, may need to consider switching to Bisoprolol.   2. History of Thyroid Cancer - He is s/p thyroidectomy in 2017 and remains on Levothyroxine.   Disposition: He already has previously scheduled follow-up with [facility] and a follow-up echocardiogram in 6 weeks. Will arrange for follow-up here in [place] in 3 months.   Signed, Ellsworth Lennox, PA-C

## 2023-05-16 ENCOUNTER — Other Ambulatory Visit: Payer: Self-pay

## 2023-05-16 ENCOUNTER — Other Ambulatory Visit (HOSPITAL_COMMUNITY)
Admission: RE | Admit: 2023-05-16 | Discharge: 2023-05-16 | Disposition: A | Discharge status: Home / Self Care | Payer: No Typology Code available for payment source | Source: Ambulatory Visit | Attending: Student | Admitting: Student

## 2023-05-16 ENCOUNTER — Ambulatory Visit: Payer: No Typology Code available for payment source | Attending: Student | Admitting: Student

## 2023-05-16 ENCOUNTER — Encounter: Payer: Self-pay | Admitting: Student

## 2023-05-16 VITALS — BP 126/78 | HR 72 | Ht 71.0 in | Wt 223.0 lb

## 2023-05-16 DIAGNOSIS — Z79899 Other long term (current) drug therapy: Secondary | ICD-10-CM | POA: Diagnosis not present

## 2023-05-16 DIAGNOSIS — I5020 Unspecified systolic (congestive) heart failure: Secondary | ICD-10-CM | POA: Diagnosis not present

## 2023-05-16 LAB — BASIC METABOLIC PANEL
Anion gap: 8 (ref 5–15)
BUN: 13 mg/dL (ref 6–20)
CO2: 24 mmol/L (ref 22–32)
Calcium: 9.3 mg/dL (ref 8.9–10.3)
Chloride: 102 mmol/L (ref 98–111)
Creatinine, Ser: 1.04 mg/dL (ref 0.61–1.24)
GFR, Estimated: >60 mL/min (ref >60)
Glucose, Bld: 99 mg/dL (ref 70–99)
Potassium: 3.9 mmol/L (ref 3.5–5.1)
Sodium: 134 mmol/L — ABNORMAL LOW (ref 135–145)

## 2023-05-16 MED ORDER — METOPROLOL SUCCINATE ER 25 MG PO TB24: 12.5000 mg | ORAL_TABLET | Freq: Every day | ORAL | 3 refills | Status: DC

## 2023-05-16 NOTE — Patient Instructions (Addendum)
Medication Instructions:  DECREASE Toprol to 12.5 mg daily   Labwork: BMET today  Testing/Procedures: None today  Follow-Up: 3 months  Any Other Special Instructions Will Be Listed Below (If Applicable).  If you need a refill on your cardiac medications before your next appointment, please call your pharmacy.

## 2023-05-17 ENCOUNTER — Other Ambulatory Visit: Payer: Self-pay

## 2023-05-24 ENCOUNTER — Other Ambulatory Visit (HOSPITAL_COMMUNITY): Payer: Self-pay

## 2023-05-27 ENCOUNTER — Other Ambulatory Visit (HOSPITAL_COMMUNITY): Payer: Self-pay

## 2023-06-03 ENCOUNTER — Encounter (HOSPITAL_COMMUNITY): Payer: Self-pay | Admitting: Cardiology

## 2023-06-05 IMAGING — CR DG CHEST 2V
2 series · 2 of 2 positions shown · non-contrast
Comparison: No pertinent prior exams available for comparison.

CLINICAL DATA: Provided history: Cough. Additional history
provided: Cough, chest discomfort.

EXAM:
CHEST - 2 VIEW

[w chest lat]
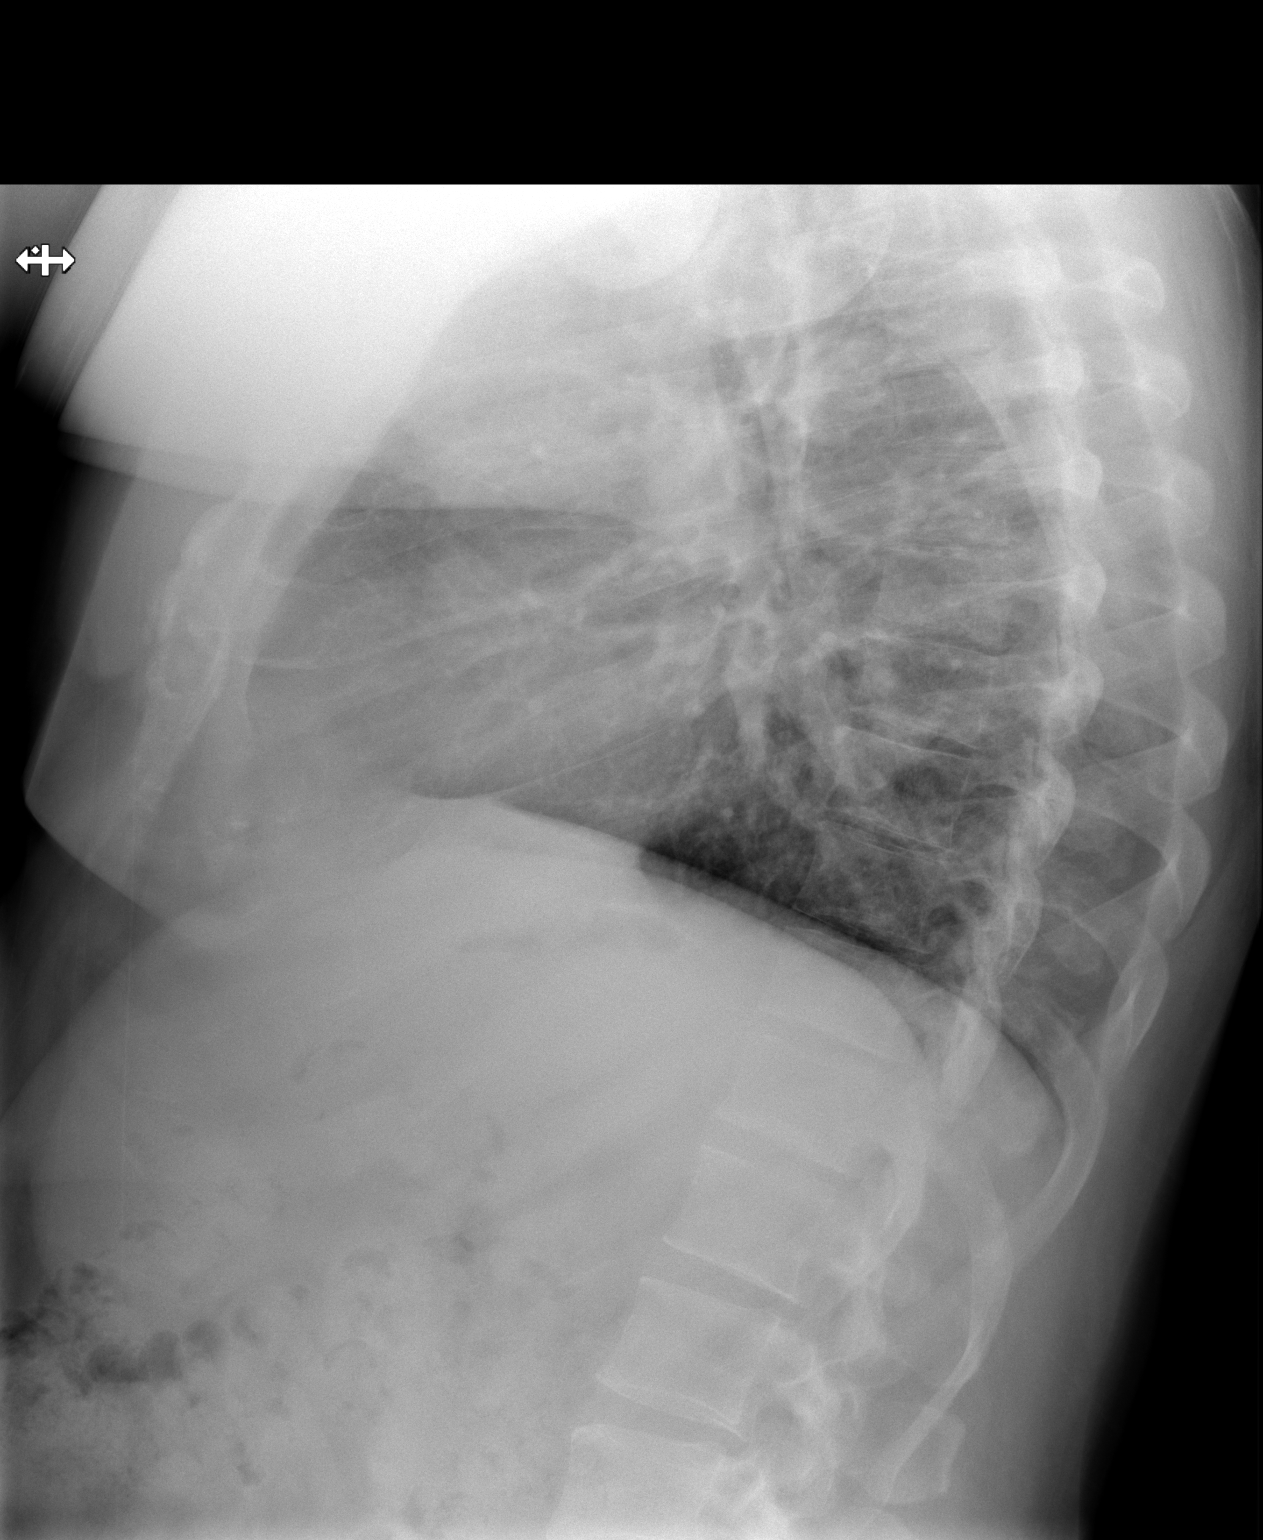

[w pa chest]
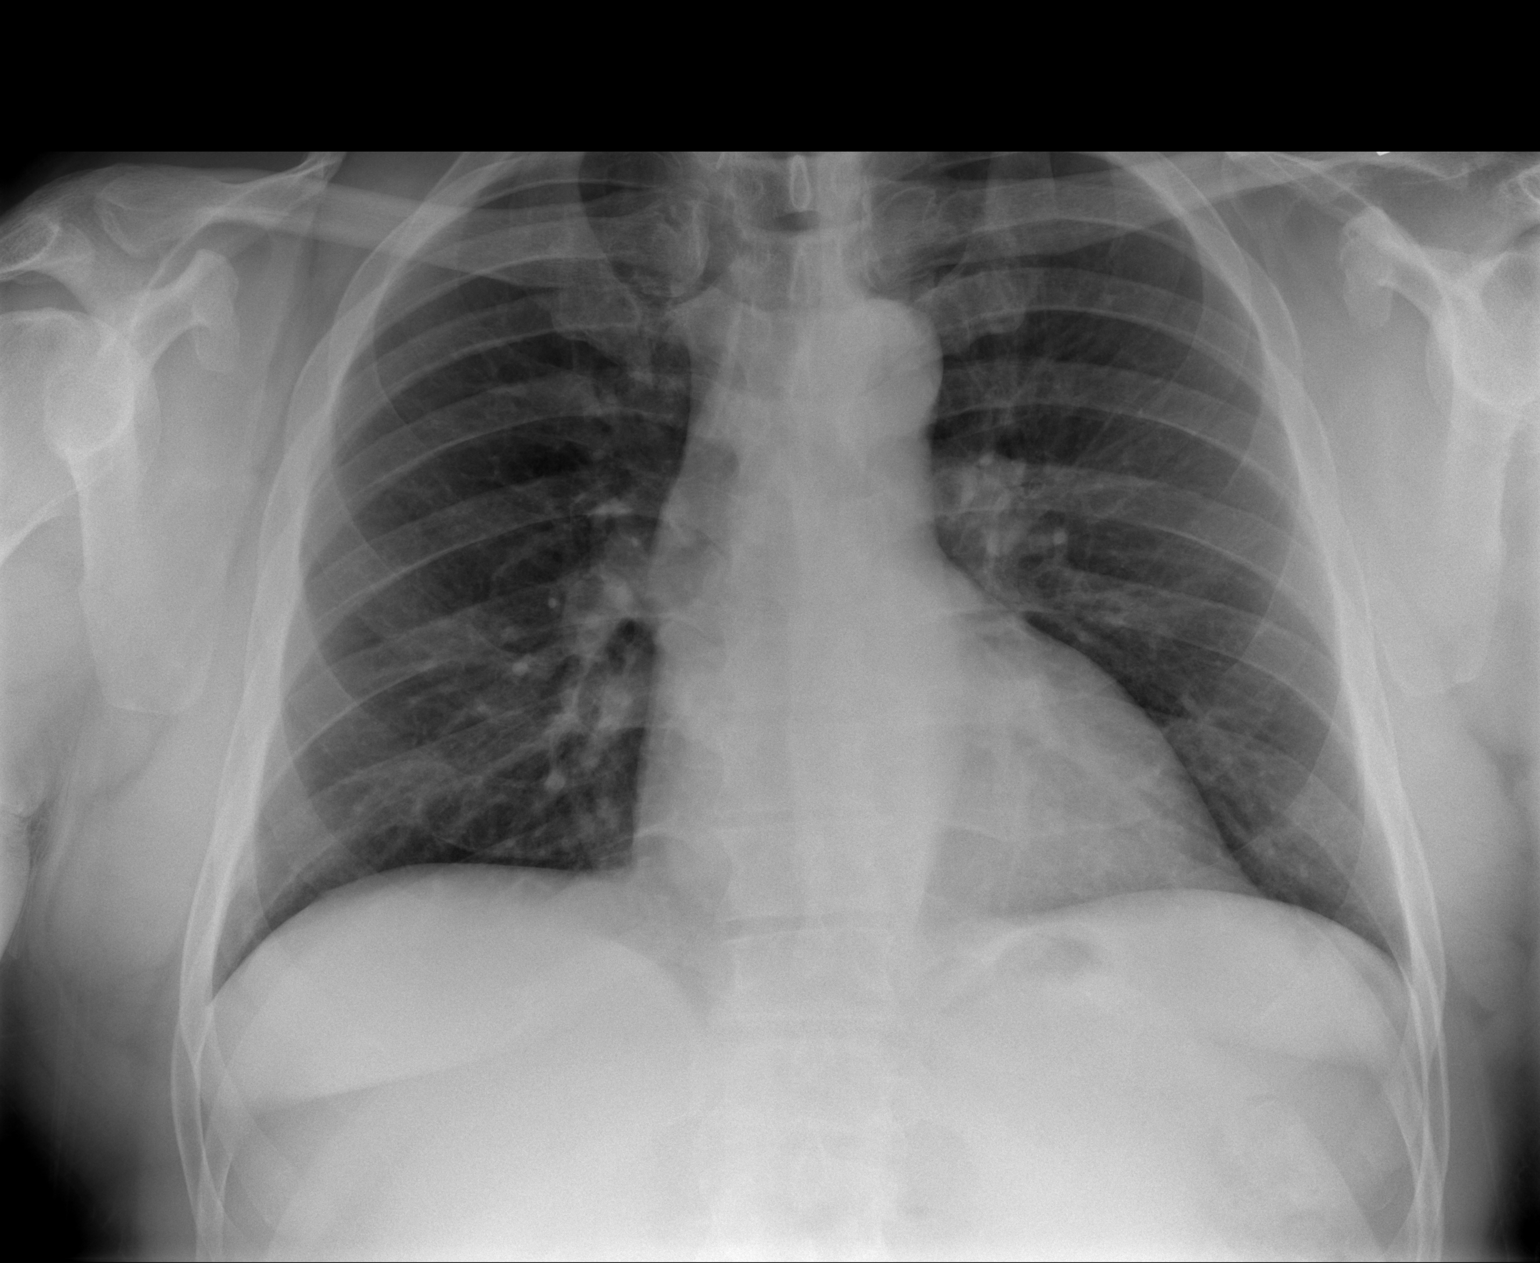

[2 of 2 positions shown; findings below may reference images not displayed]

FINDINGS: Heart size within normal limits. No appreciable airspace
consolidation. No evidence of pleural effusion or pneumothorax. No
acute bony abnormality identified.
IMPRESSION: No evidence of active cardiopulmonary disease.

## 2023-06-19 ENCOUNTER — Other Ambulatory Visit: Payer: Self-pay

## 2023-06-19 ENCOUNTER — Other Ambulatory Visit (HOSPITAL_COMMUNITY): Payer: Self-pay

## 2023-07-01 ENCOUNTER — Other Ambulatory Visit (HOSPITAL_COMMUNITY): Payer: Self-pay | Admitting: Cardiology

## 2023-07-01 ENCOUNTER — Ambulatory Visit (HOSPITAL_BASED_OUTPATIENT_CLINIC_OR_DEPARTMENT_OTHER)
Admission: RE | Admit: 2023-07-01 | Discharge: 2023-07-01 | Disposition: A | Discharge status: Home / Self Care | Payer: No Typology Code available for payment source | Source: Ambulatory Visit | Attending: Cardiology | Admitting: Cardiology

## 2023-07-01 ENCOUNTER — Inpatient Hospital Stay (HOSPITAL_COMMUNITY)
Admission: RE | Admit: 2023-07-01 | Discharge: 2023-07-01 | Disposition: A | Discharge status: Home / Self Care | Payer: No Typology Code available for payment source | Source: Ambulatory Visit | Attending: Cardiology | Admitting: Cardiology

## 2023-07-01 ENCOUNTER — Ambulatory Visit (HOSPITAL_COMMUNITY)
Admission: RE | Admit: 2023-07-01 | Discharge: 2023-07-01 | Disposition: A | Discharge status: Home / Self Care | Payer: No Typology Code available for payment source | Source: Ambulatory Visit | Attending: Cardiology | Admitting: Cardiology

## 2023-07-01 ENCOUNTER — Encounter (HOSPITAL_COMMUNITY): Payer: Self-pay | Admitting: Cardiology

## 2023-07-01 VITALS — BP 104/60 | HR 57 | Wt 221.8 lb

## 2023-07-01 DIAGNOSIS — Z87891 Personal history of nicotine dependence: Secondary | ICD-10-CM | POA: Diagnosis not present

## 2023-07-01 DIAGNOSIS — I447 Left bundle-branch block, unspecified: Secondary | ICD-10-CM | POA: Diagnosis not present

## 2023-07-01 DIAGNOSIS — I5022 Chronic systolic (congestive) heart failure: Secondary | ICD-10-CM | POA: Diagnosis not present

## 2023-07-01 DIAGNOSIS — E890 Postprocedural hypothyroidism: Secondary | ICD-10-CM | POA: Diagnosis not present

## 2023-07-01 DIAGNOSIS — K219 Gastro-esophageal reflux disease without esophagitis: Secondary | ICD-10-CM | POA: Diagnosis not present

## 2023-07-01 DIAGNOSIS — Z8616 Personal history of COVID-19: Secondary | ICD-10-CM | POA: Insufficient documentation

## 2023-07-01 DIAGNOSIS — Z85850 Personal history of malignant neoplasm of thyroid: Secondary | ICD-10-CM | POA: Insufficient documentation

## 2023-07-01 DIAGNOSIS — I5020 Unspecified systolic (congestive) heart failure: Secondary | ICD-10-CM | POA: Insufficient documentation

## 2023-07-01 DIAGNOSIS — Z79890 Hormone replacement therapy (postmenopausal): Secondary | ICD-10-CM | POA: Insufficient documentation

## 2023-07-01 DIAGNOSIS — E669 Obesity, unspecified: Secondary | ICD-10-CM | POA: Diagnosis not present

## 2023-07-01 DIAGNOSIS — R6882 Decreased libido: Secondary | ICD-10-CM

## 2023-07-01 DIAGNOSIS — I428 Other cardiomyopathies: Secondary | ICD-10-CM | POA: Diagnosis not present

## 2023-07-01 DIAGNOSIS — I493 Ventricular premature depolarization: Secondary | ICD-10-CM

## 2023-07-01 DIAGNOSIS — Z923 Personal history of irradiation: Secondary | ICD-10-CM | POA: Insufficient documentation

## 2023-07-01 DIAGNOSIS — Z79899 Other long term (current) drug therapy: Secondary | ICD-10-CM | POA: Insufficient documentation

## 2023-07-01 DIAGNOSIS — G4730 Sleep apnea, unspecified: Secondary | ICD-10-CM | POA: Diagnosis not present

## 2023-07-01 LAB — BRAIN NATRIURETIC PEPTIDE: B Natriuretic Peptide: 17.4 pg/mL (ref 0.0–100.0)

## 2023-07-01 LAB — BASIC METABOLIC PANEL
Anion gap: 8 (ref 5–15)
BUN: 21 mg/dL — ABNORMAL HIGH (ref 6–20)
CO2: 22 mmol/L (ref 22–32)
Calcium: 9.1 mg/dL (ref 8.9–10.3)
Chloride: 104 mmol/L (ref 98–111)
Creatinine, Ser: 1.31 mg/dL — ABNORMAL HIGH (ref 0.61–1.24)
GFR, Estimated: >60 mL/min (ref >60)
Glucose, Bld: 107 mg/dL — ABNORMAL HIGH (ref 70–99)
Potassium: 4.3 mmol/L (ref 3.5–5.1)
Sodium: 134 mmol/L — ABNORMAL LOW (ref 135–145)

## 2023-07-01 LAB — ECHOCARDIOGRAM COMPLETE
Area-P 1/2: 3.63 cm2
Calc EF: 42.1 %
S' Lateral: 3.3 cm
Single Plane A2C EF: 47.4 %
Single Plane A4C EF: 34.7 %

## 2023-07-01 MED ORDER — SILDENAFIL CITRATE 100 MG PO TABS: 50.0000 mg | ORAL_TABLET | Freq: Every day | ORAL | 0 refills | Status: DC | PRN

## 2023-07-01 MED ORDER — METOPROLOL SUCCINATE ER 25 MG PO TB24: 25.0000 mg | ORAL_TABLET | Freq: Every day | ORAL | 3 refills | Status: DC

## 2023-07-01 NOTE — Patient Instructions (Signed)
Medication Changes:  Increase Metoprolol XL to 25 mg Daily  Take Viagra 50 mg (1/2 tab) AS NEEDED  Lab Work:  Labs done today, your results will be available in MyChart, we will contact you for abnormal readings.  Testing/Procedures:  Your provider has recommended that  you wear a Zio Patch for 7 days.  This monitor will record your heart rhythm for our review.  IF you have any symptoms while wearing the monitor please press the button.  If you have any issues with the patch or you notice a red or orange light on it please call the company at [phone].  Once you remove the patch please mail it back to the company as soon as possible so we can get the results.   Referrals:  none  Special Instructions // Education:  Do the following things EVERYDAY: Weigh yourself in the morning before breakfast. Write it down and keep it in a log. Take your medicines as prescribed Eat low salt foods--Limit salt (sodium) to 2000 mg per day.  Stay as active as you can everyday Limit all fluids for the day to less than 2 liters   Follow-Up in: 2 months  At the [facility], you and your health needs are our priority. We have a designated team specialized in the treatment of Heart Failure. This Care Team includes your primary Heart Failure Specialized Cardiologist (physician), Advanced Practice Providers (APPs- Physician Assistants and Nurse Practitioners), and Pharmacist who all work together to provide you with the care you need, when you need it.   You may see any of the following providers on your designated Care Team at your next follow up:  Dr. Arvilla Meres Dr. Marca Ancona Dr. Marcos Eke, NP Robbie Lis, [place] [facility] [place], [place] Brynda Peon, NP Karle Plumber, PharmD   Please be sure to bring in all your medications bottles to every appointment.   Need to Contact us:  If you have any questions or concerns before your next  appointment please send [place] a message through [place] or call our office at [phone].    TO LEAVE A MESSAGE FOR THE NURSE SELECT OPTION 2, PLEASE LEAVE A MESSAGE INCLUDING: YOUR NAME DATE OF BIRTH CALL BACK NUMBER REASON FOR CALL**this is important as we prioritize the call backs  YOU WILL RECEIVE A CALL BACK THE SAME DAY AS LONG AS YOU CALL BEFORE 4:00 PM

## 2023-07-01 NOTE — Progress Notes (Signed)
[facility]ED [facility] CLINIC NOTE  Referring Physician: [facility]  Primary Care: [facility], [place] Primary Cardiologist: Dr. Isabel Caprice  HPI: Elijah Mall. is a 57 y.o. male with heart failure with reduced ejection fraction secondary to nonischemic cardiomyopathy, thyroid cancer status post thyroidectomy in 2017, history of COVID-19 in 2023, GERD presenting today to establish care.  Elijah Perez cardiac history dates back to January 01, 2023 when he was admitted to [facility] with acute systolic heart failure.  He had a left heart cath that was unremarkable and right heart cath that demonstrated preserved cardiac output.  He was started on low-dose GDMT and since that time is followed up in [facility] clinic where GDMT was further uptitrated. He ran a 5K in November and although he completed it he felt much more fatigued than usual. Prior to this he could run a 77m mile. No supplements for exercise other than protein.   Interval history Since his last visit, he has started lifting weights and playing golf. Becomes SOB after weight lifting. Otherwise, only complaint is decreased libido from metoprolol. Currently only taking < 12.5mg  daily.   Activity level/exercise tolerance: NYHA II Orthopnea:  Sleeps flat Paroxysmal noctural dyspnea:  No Chest pain/pressure:  No Orthostatic lightheadedness:  No Palpitations: improved significantly.  Lower extremity edema:  No Presyncope/syncope:  No Cough:  No  Past Medical History:  Diagnosis Date   Cancer (HCC)    HFrEF (heart failure with reduced ejection fraction) (HCC)    a. EF 30-35% in 12/2022 with cath showing no evidence of CAD   Horner's syndrome     Current Outpatient Medications  Medication Sig Dispense Refill   allopurinol (ZYLOPRIM) 100 MG tablet Take 100 mg by mouth daily.     dapagliflozin propanediol (FARXIGA) 10 MG TABS tablet Take 1 tablet (10 mg total) by mouth daily. 30 tablet 11   diphenhydrAMINE  (BENADRYL) 25 mg capsule Take 25 mg by mouth every 6 (six) hours as needed for allergies or itching. (Patient not taking: Reported on 04/15/2023)     furosemide (LASIX) 40 MG tablet Take 1 tablet (40 mg total) by mouth daily as needed (weight gain (3lbs in 1 day or 5lbs in 1 week) or worsening swelling/edema). 30 tablet 1   levothyroxine (SYNTHROID) 200 MCG tablet Take 200 mcg by mouth daily before breakfast.     metoprolol succinate (TOPROL XL) 25 MG 24 hr tablet Take 0.5 tablets (12.5 mg total) by mouth daily. 45 tablet 3   omeprazole (PRILOSEC) 20 MG capsule Take 20 mg by mouth daily.     potassium chloride SA (KLOR-CON M) 20 MEQ tablet Take 1 tablet (20 mEq total) by mouth daily as needed (when you need to take the Lasix). 30 tablet 1   sacubitril-valsartan (ENTRESTO) 49-51 MG Take 1 tablet by mouth 2 (two) times daily. 60 tablet 6   spironolactone (ALDACTONE) 25 MG tablet Take 1 tablet (25 mg total) by mouth daily. 30 tablet 6   No current facility-administered medications for this encounter.    Allergies  Allergen Reactions   Other Other (See Comments)    Pet dander, pollen -> eyes itchy/watery, sneezing   Shellfish Allergy Itching      Social History   Socioeconomic History   Marital status: Married    Spouse name: Elijah Perez   Number of children: 8   Years of education: Not on file   Highest education level: High school graduate  Occupational History   Occupation:  [employer]  Tobacco Use   Smoking status: Former    Current packs/day: 0.00    Types: Cigarettes    Quit date: 1996    Years since quitting: 28.5   Smokeless tobacco: Never  Vaping Use   Vaping status: Never Used  Substance and Sexual Activity   Alcohol use: Not Currently    Alcohol/week: 2.0 standard drinks of alcohol    Types: 2 Glasses of wine per week   Drug use: Not Currently   Sexual activity: Not on file  Other Topics Concern   Not on file  Social History Narrative   Not on file   Social Determinants  of Health   Financial Resource Strain: Low Risk  (01/04/2023)   Overall Financial Resource Strain (CARDIA)    Difficulty of Paying Living Expenses: Not very hard  Food Insecurity: No Food Insecurity (01/01/2023)   Hunger Vital Sign    Worried About Running Out of Food in the Last Year: Never true    Ran Out of Food in the Last Year: Never true  Transportation Needs: No Transportation Needs (01/01/2023)   PRAPARE - Administrator, Civil Service (Medical): No    Lack of Transportation (Non-Medical): No  Physical Activity: Not on file  Stress: Not on file  Social Connections: Unknown (04/28/2022)   [facility] from [facility]   Social Network    Social Network: Not on file  Intimate Partner Violence: Not At Risk (01/01/2023)   Humiliation, Afraid, Rape, and Kick questionnaire    Fear of Current or Ex-Partner: No    Emotionally Abused: No    Physically Abused: No    Sexually Abused: No      Family History  Problem Relation Age of Onset   Valvular heart disease Mother    Stroke Father    Heart murmur Brother    Atrial fibrillation Brother     PHYSICAL EXAM: Vitals:   07/01/23 1348  BP: 104/60  Pulse: (!) 57  SpO2: 97%   GENERAL: Well nourished, well developed, and in no apparent distress at rest.  HEENT: Negative for arcus senilis or xanthelasma. There is no scleral icterus.  The mucous membranes are pink and moist.   NECK: Supple, No masses. Normal carotid upstrokes without bruits. No masses or thyromegaly.    CHEST: There are no chest wall deformities. There is no chest wall tenderness. Respirations are unlabored.  Lungs- CTA B/L CARDIAC:  JVP: 7 cm          Normal rate with regular rhythm. No murmurs, rubs or gallops.  Pulses are 2+ and symmetrical in upper and lower extremities. No edema.  ABDOMEN: Soft, non-tender, non-distended. There are no masses or hepatomegaly. There are normal bowel sounds.  EXTREMITIES: Warm and well perfused with no cyanosis, clubbing.   LYMPHATIC: No axillary or supraclavicular lymphadenopathy.  NEUROLOGIC: Patient is oriented x3 with no focal or lateralizing neurologic deficits.  PSYCH: Patients affect is appropriate, there is no evidence of anxiety or depression.  SKIN: Warm and dry; no lesions or wounds.    DATA REVIEW  ECG: 01/11/23: NSR with PVC.  07/01/23: NSR w/ frequent PVCs.   ECHO: 01/01/23: LVEF 30 to 35% with global hypokinesis.  Normal RV function.  Severely dilated left atrium. 03/18/2023: LVEF 35% with global hypokinesis  CATH: 01/03/23: No angiographic evidence of CAD Normal right and left heart pressures (RA 1, RV 22/0/2, PA 23/5 mean 12, PCWP 7, LV 102/3/9, AO 108/71, CO 4.97 L/min, CI  2.22)   ASSESSMENT & PLAN:  Heart failure with reduced ejection fraction Etiology of HF: Nonischemic cardiomyopathy, left heart cath as demonstrated above with no evidence of CAD. Reports frequent palpitations prior to dx of HFrEF, enlarged LA on TTE. Ziopatch without r tachyarrhythmias. Otherwise, no significant FH. Has lifted heavy weights for years; however, no reported use of anabolic steroids or other products aside from protein/creatine.  NYHA class / AHA Stage:NYHA II Volume status & Diuretics: euvolemic; has not required lasix for several weeks now.  Vasodilators: Entresto 49/51mg  BID Beta-Blocker: continue toprol 25mg  daily; previously taking 12.5mg  or holding it due to low libido. Will prescribe viagra.  MRA: spironolactone 12.5mg  Cardiometabolic:continue jardiance Devices therapies & Valvulopathies:not indicated Advanced therapies:not indicated.   2. Thyroid Cancer S/P thyroidectomy  - Recently reduced levothyroxine dose, now on daily.  - S/P radiation and radioactive iodone.  - No chemotherapy.   3. Sleep Apnea - reports that it was very mild and CPAP was not advised.   4. Frequent PVC - ziopatch previously with PVC burden of 6% - Will repeat today; frequent ectopy on exam, during TTE &  EKG.   5. Decreased libido - Will need to uptitrate metoprolol dose - start sildenafil prn.   Personally reviewed echocardiogram, ECGs, imaging; reviewed this with patient.   Ashima Shrake Advanced [facility] Mechanical Circulatory Support

## 2023-07-02 ENCOUNTER — Telehealth (HOSPITAL_COMMUNITY): Payer: Self-pay

## 2023-07-02 NOTE — Telephone Encounter (Signed)
 Pt aware, agreeable, and verbalized understanding 

## 2023-07-02 NOTE — Telephone Encounter (Signed)
-----   Message from [facility] sent at 07/02/2023  8:28 AM EDT ----- Elijah Perez creatinine is a little up likely due to hypovolemia. Can we let him know that he can drink more water now. His echocardiogram also shows that his LV function is up to 40-45% now.   Thanks,  [employer]

## 2023-07-11 ENCOUNTER — Other Ambulatory Visit (HOSPITAL_COMMUNITY): Payer: Self-pay

## 2023-07-16 ENCOUNTER — Telehealth (HOSPITAL_COMMUNITY): Payer: Self-pay

## 2023-07-16 NOTE — Telephone Encounter (Addendum)
[employer]) called to report that the patient had an episode of V-tach with an average heart rate 120bpm lasting for 10 sec. Which can be seen on Pg 11 of strip #4. Report is posted on the website.

## 2023-08-06 ENCOUNTER — Other Ambulatory Visit (HOSPITAL_COMMUNITY): Payer: Self-pay

## 2023-08-08 ENCOUNTER — Other Ambulatory Visit (HOSPITAL_COMMUNITY): Payer: Self-pay

## 2023-08-16 ENCOUNTER — Other Ambulatory Visit (HOSPITAL_COMMUNITY): Payer: Self-pay

## 2023-08-28 ENCOUNTER — Other Ambulatory Visit (HOSPITAL_COMMUNITY): Payer: Self-pay

## 2023-08-28 ENCOUNTER — Ambulatory Visit: Payer: No Typology Code available for payment source | Admitting: Student

## 2023-08-28 NOTE — Progress Notes (Deleted)
Cardiology Office Note    Date:  08/28/2023  ID:  Elijah Box., DOB Dec 29, 1965, MRN [id number] Cardiologist: Dina Rich, MD   AHF: Dr. Gasper Lloyd  History of Present Illness:    Elijah Perez. is a 57 y.o. male with past medical history of HFrEF (EF 30-35% in 12/2022 with cath showing no evidence of CAD), thyroid cancer (s/p thyroidectomy in 2017) and GERD who presents to the office today for 36-month follow-up.  He was examined by myself in 04/2023 and was walking a mile several days each week without anginal symptoms.  He did report having worsening dizziness with positional changes following recent dose adjustment of Entresto and spironolactone..  Medications were not further titrated given this and he was continued on Entresto 49-51 mg twice daily, Farxiga 10 mg daily and spironolactone 25 mg daily.  He did report a significant decrease in libido since being started on beta-blocker therapy, therefore Toprol-XL was reduced from 25 mg daily to 12.5 mg daily to see if this would help with his symptoms.  He did follow-up with Advanced Heart Failure on 07/01/2023 and reported still having decreased libido, therefore Toprol-XL was increased back to 25 mg daily and he was started on Viagra.  Follow-up labs did show that his creatinine had increased from 1.04 to 1.31 and he was encouraged to increase his fluid consumption.  Repeat echocardiogram did show that his EF had improved to 40 to 45% with RV function being normal.  He also wore a Zio patch which showed predominantly normal sinus rhythm with an average heart rate of 70 bpm.  He did have 166 episodes of VT with the longest lasting for 20 beats.  He had a 8.2% PVC burden and 1.5% couplet burden.  - BMET; Mg - Titrate BB? EP referral?  ROS: ***  Studies Reviewed:   EKG: EKG is*** ordered today and demonstrates ***   EKG Interpretation Date/Time:    Ventricular Rate:    PR Interval:    QRS Duration:    QT Interval:    QTC  Calculation:   R Axis:      Text Interpretation:         R/LHC: 12/2022 No angiographic evidence of CAD Normal right and left heart pressures (RA 1, RV 22/0/2, PA 23/5 mean 12, PCWP 7, LV 102/3/9, AO 108/71, CO 4.97 L/min, CI 2.22)   Recommendations: Medical management of non-ischemic cardiomyopathy.   Echocardiogram: 06/2023 IMPRESSIONS     1. Left ventricular ejection fraction, by estimation, is 40 to 45%. Left  ventricular ejection fraction by 2D MOD biplane is 42.1 %. The left  ventricle has mildly decreased function. The left ventricle has no  regional wall motion abnormalities. There is  moderate left ventricular hypertrophy. Left ventricular diastolic  parameters are consistent with Grade I diastolic dysfunction (impaired  relaxation).   2. Right ventricular systolic function is normal. The right ventricular  size is normal.   3. The mitral valve is grossly normal. No evidence of mitral valve  regurgitation.   4. The aortic valve is tricuspid. Aortic valve regurgitation is not  visualized.   5. Rhythm strip during this exam demonstrates normal sinus rhythm and  premature ventricular contractions.   Comparison(s): Changes from prior study are noted. 03/18/2023: LVEF 30-35%.    Event Monitor: 06/2023 6 days and 16 hours (2024-07-15T14:34:40-0400 to 2024-07-22T06:51:56-0400)  Patient had a min HR of 48 bpm, max HR of 178 bpm, and avg HR of 70 bpm. Predominant  underlying rhythm was Sinus Rhythm. 166 Ventricular Tachycardia runs occurred, the run with the fastest interval lasting 4 beats with a max rate of 160 bpm, the longest lasting 20 beats with an avg rate of 120 bpm. 1 run of Supraventricular Tachycardia occurred lasting 14 beats with a max rate of 125 bpm (avg 117 bpm). Isolated SVEs were rare (<1.0%), SVE Couplets were rare (<1.0%), and SVE Triplets were rare (<1.0%). Isolated VEs were frequent (8.2%, 48731), VE Couplets were occasional (1.5%, 4358), and VE Triplets were  rare (<1.0%, 323). Ventricular Bigeminy and Trigeminy were present.   Risk Assessment/Calculations:   {Does this patient have ATRIAL FIBRILLATION?:[id number]} No BP recorded.  {Refresh Note OR Click here to enter BP  :1}***         Physical Exam:   VS:  There were no vitals taken for this visit.   Wt Readings from Last 3 Encounters:  07/01/23 221 lb 12.8 oz (100.6 kg)  05/16/23 223 lb (101.2 kg)  04/15/23 224 lb (101.6 kg)     GEN: Well nourished, well developed in no acute distress NECK: No JVD; No carotid bruits CARDIAC: ***RRR, no murmurs, rubs, gallops RESPIRATORY:  Clear to auscultation without rales, wheezing or rhonchi  ABDOMEN: Appears non-distended. No obvious abdominal masses. EXTREMITIES: No clubbing or cyanosis. No edema.  Distal pedal pulses are 2+ bilaterally.   Assessment and Plan:   1. Chronic HFrEF - His EF was at 30-35% in 12/2022 with cath showing no evidence of CAD and recent echocardiogram in 06/2023 showed this had improved to 40-45%.  - *** - Continue current medical therapy with Farxiga 10 mg daily, Lasix 40 mg as needed, Toprol-XL 25 mg daily, Entresto 49-51 mg twice daily and Spironolactone 25 mg daily.  2. PVC's - Recent cardiac monitor showed an 8.2% PVC burden and he did have episodes of VT with the longest lasting for 20 beats.  3. History of Thyroid Cancer - He underwent prior thyroidectomy and remains on Synthroid 200 mcg daily.    Signed, Ellsworth Lennox, PA-C

## 2023-08-30 ENCOUNTER — Encounter: Payer: Self-pay | Admitting: Gastroenterology

## 2023-08-30 ENCOUNTER — Ambulatory Visit: Payer: No Typology Code available for payment source | Admitting: Gastroenterology

## 2023-08-30 VITALS — BP 121/81 | HR 60 | Temp 97.4°F | Ht 71.0 in | Wt 230.2 lb

## 2023-08-30 DIAGNOSIS — K5900 Constipation, unspecified: Secondary | ICD-10-CM | POA: Insufficient documentation

## 2023-08-30 DIAGNOSIS — K5904 Chronic idiopathic constipation: Secondary | ICD-10-CM

## 2023-08-30 DIAGNOSIS — K625 Hemorrhage of anus and rectum: Secondary | ICD-10-CM | POA: Diagnosis not present

## 2023-08-30 NOTE — Progress Notes (Addendum)
GI Office Note    Referring Provider: Erick Colace, MD Primary Care Physician:  Erick Colace, MD  Primary Gastroenterologist: Katrinka Blazing, MD   Chief Complaint   Chief Complaint  Patient presents with   Rectal Bleeding    Having issues with bleeding states that he thinks it is due to constipation. States that he will use miralax as needed for the constipation. Bright red blood noticed in toilet not in stool.     History of Present Illness   Elijah Perez. is a 57 y.o. male presenting today at the request of Dr. Lafayette Dragon at the [facility] for colonoscopy for rectal bleeding.   Remote colonoscopy. Reported prior history of mild anemia with normal iron indices. He has occasional brbpr. He has thalassemia trait felt to account for his anemia per records. History of GERD well controlled.   Today he states he has had intermittent brbpr for about a year. Sees red blood in the water. Usually only happens with constipation. Several weeks in between episodes. If no good consistent BM in 3-4 days will start Miralax. Miralax works very well. Heartburn well controlled on omeprazole. Tried to wean off PPI with dietary changes but didn't work well, working with doctors with this. On PPI couple of years. Follows with GI doctor at the [place]. No dysphagia. Prior EGD, found a "pocket". Denies abdominal pain. No N/V. No unintentional weight loss.   Labs from 12/2022: Hgb normal. See below. Labs from August 2022: Hemoglobin 13.3, MCV 73.2. Labs from November 2023: Hematocrit 42.8  Patient admitted to [facility] in 12/2022 with acute systolic heart failure. He had left heart cath that was unremarkable and right heart cath demonstrated preserved cardiac output. Last seen in heart failure clinic in 06/2023. He is on Entresto, toprol, spironolactone, jardiance. ECHO 06/2023: LVEF 40-45%. Zio patch completed. He has follow up with Dr. Gasper Lloyd next week.   Medications   Current Outpatient Medications  Medication  Sig Dispense Refill   allopurinol (ZYLOPRIM) 100 MG tablet Take 100 mg by mouth daily.     dapagliflozin propanediol (FARXIGA) 10 MG TABS tablet Take 1 tablet (10 mg total) by mouth daily. 30 tablet 11   furosemide (LASIX) 40 MG tablet Take 1 tablet (40 mg total) by mouth daily as needed (weight gain (3lbs in 1 day or 5lbs in 1 week) or worsening swelling/edema). 30 tablet 1   levothyroxine (SYNTHROID) 200 MCG tablet Take 200 mcg by mouth daily before breakfast.     metoprolol succinate (TOPROL XL) 25 MG 24 hr tablet Take 1 tablet (25 mg total) by mouth at bedtime. 90 tablet 3   omeprazole (PRILOSEC) 20 MG capsule Take 20 mg by mouth daily.     polyethylene glycol powder (GLYCOLAX/MIRALAX) 17 GM/SCOOP powder Take 1 Container by mouth daily as needed.     potassium chloride SA (KLOR-CON M) 20 MEQ tablet Take 1 tablet (20 mEq total) by mouth daily as needed (when you need to take the Lasix). 30 tablet 1   sacubitril-valsartan (ENTRESTO) 49-51 MG Take 1 tablet by mouth 2 (two) times daily. 60 tablet 6   sildenafil (VIAGRA) 100 MG tablet Take 0.5 tablets (50 mg total) by mouth daily as needed for erectile dysfunction. 10 tablet 0   spironolactone (ALDACTONE) 25 MG tablet Take 1 tablet (25 mg total) by mouth daily. 30 tablet 6   No current facility-administered medications for this visit.    Allergies   Allergies as of 08/30/2023 -  Review Complete 08/30/2023  Allergen Reaction Noted   Other Other (See Comments) 03/28/2016   Shellfish allergy Itching 03/14/2022    Past Medical History   Past Medical History:  Diagnosis Date   Cancer (HCC)    thyroid   Gout    HFrEF (heart failure with reduced ejection fraction) (HCC)    a. EF 30-35% in 12/2022 with cath showing no evidence of CAD   Horner's syndrome    Thalassemia trait     Past Surgical History   Past Surgical History:  Procedure Laterality Date   EYE SURGERY     RIGHT/LEFT HEART CATH AND CORONARY ANGIOGRAPHY N/A 01/03/2023    Procedure: RIGHT/LEFT HEART CATH AND CORONARY ANGIOGRAPHY;  Surgeon: Kathleene Hazel, MD;  Location: [facility] INVASIVE CV LAB;  Service: Cardiovascular;  Laterality: N/A;   THYROIDECTOMY Bilateral     Past Family History   Family History  Problem Relation Age of Onset   Valvular heart disease Mother    Stroke Father    Heart murmur Brother    Atrial fibrillation Brother    Cancer - Colon Neg Hx     Past Social History   Social History   Socioeconomic History   Marital status: Married    Spouse name: Emmily   Number of children: 8   Years of education: Not on file   Highest education level: High school graduate  Occupational History   Occupation: [employer]  Tobacco Use   Smoking status: Former    Current packs/day: 0.00    Types: Cigarettes    Quit date: 1996    Years since quitting: 28.7   Smokeless tobacco: Never  Vaping Use   Vaping status: Never Used  Substance and Sexual Activity   Alcohol use: Not Currently    Alcohol/week: 2.0 standard drinks of alcohol    Types: 2 Glasses of wine per week   Drug use: Not Currently   Sexual activity: Not on file  Other Topics Concern   Not on file  Social History Narrative   Not on file   Social Determinants of Health   Financial Resource Strain: Low Risk  (01/04/2023)   Overall Financial Resource Strain (CARDIA)    Difficulty of Paying Living Expenses: Not very hard  Food Insecurity: No Food Insecurity (01/01/2023)   Hunger Vital Sign    Worried About Running Out of Food in the Last Year: Never true    Ran Out of Food in the Last Year: Never true  Transportation Needs: No Transportation Needs (01/01/2023)   PRAPARE - Administrator, Civil Service (Medical): No    Lack of Transportation (Non-Medical): No  Physical Activity: Not on file  Stress: Not on file  Social Connections: Unknown (04/28/2022)   Received from [facility]   Social Network    Social Network: Not on file  Intimate  Partner Violence: Not At Risk (01/01/2023)   Humiliation, Afraid, Rape, and Kick questionnaire    Fear of Current or Ex-Partner: No    Emotionally Abused: No    Physically Abused: No    Sexually Abused: No    Review of Systems   General: Negative for anorexia, weight loss, fever, chills, fatigue, weakness. Eyes: Negative for vision changes.  ENT: Negative for hoarseness, difficulty swallowing , nasal congestion. CV: Negative for chest pain, angina, palpitations, dyspnea on exertion, peripheral edema.  Respiratory: Negative for dyspnea at rest, dyspnea on exertion, cough, sputum, wheezing.  GI: See history of present  illness. GU:  Negative for dysuria, hematuria, urinary incontinence, urinary frequency, nocturnal urination.  MS: Negative for joint pain, low back pain.  Derm: Negative for rash or itching.  Neuro: Negative for weakness, abnormal sensation, seizure, frequent headaches, memory loss,  confusion.  Psych: Negative for anxiety, depression, suicidal ideation, hallucinations.  Endo: Negative for unusual weight change.  Heme: Negative for bruising or bleeding. Allergy: Negative for rash or hives.  Physical Exam   BP 121/81 (BP Location: Right Arm, Patient Position: Sitting, Cuff Size: Large)   Pulse 60   Temp (!) 97.4 F (36.3 C) (Temporal)   Ht 5\' 11"  (1.803 m)   Wt 230 lb 3.2 oz (104.4 kg)   SpO2 99%   BMI 32.11 kg/m    General: Well-nourished, well-developed in no acute distress.  Head: Normocephalic, atraumatic.   Eyes: Conjunctiva pink, no icterus. Mouth: Oropharyngeal mucosa moist and pink  Neck: Supple without thyromegaly, masses, or lymphadenopathy.  Lungs: Clear to auscultation bilaterally.  Heart: Regular rate and rhythm, no murmurs rubs or gallops.  Abdomen: Bowel sounds are normal, nontender, nondistended, no hepatosplenomegaly or masses,  no abdominal bruits or hernia, no rebound or guarding.   Rectal: not performed Extremities: No lower extremity  edema. No clubbing or deformities.  Neuro: Alert and oriented x 4 , grossly normal neurologically.  Skin: Warm and dry, no rash or jaundice.   Psych: Alert and cooperative, normal mood and affect.  Labs   Lab Results  Component Value Date   NA 134 (L) 07/01/2023   CL 104 07/01/2023   K 4.3 07/01/2023   CO2 22 07/01/2023   BUN 21 (H) 07/01/2023   CREATININE 1.31 (H) 07/01/2023   GFRNONAA >60 07/01/2023   CALCIUM 9.1 07/01/2023   PHOS 4.0 01/01/2023   ALBUMIN 3.7 01/02/2023   GLUCOSE 107 (H) 07/01/2023   Lab Results  Component Value Date   ALT 22 01/02/2023   AST 22 01/02/2023   ALKPHOS 52 01/02/2023   BILITOT 0.9 01/02/2023   Lab Results  Component Value Date   WBC 5.4 01/04/2023   HGB 14.6 01/04/2023   HCT 45.1 01/04/2023   MCV 73.2 (L) 01/04/2023   PLT 211 01/04/2023   Lab Results  Component Value Date   TSH 1.629 01/03/2023    Imaging Studies   No results found.  Assessment   *Rectal bleeding *Constipation  Plan for colonoscopy. Suspect benign anorectal source such as hemorrhoids but polyps, malignancy need to be excluded.   Miralax works well for patient, encouraged more regular use at least take dose if no BM in 48 hours as opposed to waiting several days to start.   PLAN   Colonoscopy in near future with Dr. Levon Hedger. ASA 3.  I have discussed the risks, alternatives, benefits with regards to but not limited to the risk of reaction to medication, bleeding, infection, perforation and the patient is agreeable to proceed. Written consent to be obtained.    Leanna Battles. Melvyn Neth, MHS, PA-C [facility]   I have reviewed the note and agree with the APP's assessment as described in this progress note  Katrinka Blazing, MD Gastroenterology and Hepatology [facility]

## 2023-08-30 NOTE — H&P (View-Only) (Signed)
GI Office Note    Referring Provider: Erick Colace, MD Primary Care Physician:  Erick Colace, MD  Primary Gastroenterologist: Katrinka Blazing, MD   Chief Complaint   Chief Complaint  Patient presents with   Rectal Bleeding    Having issues with bleeding states that he thinks it is due to constipation. States that he will use miralax as needed for the constipation. Bright red blood noticed in toilet not in stool.     History of Present Illness   Elijah Perez. is a 57 y.o. male presenting today at the request of Dr. Lafayette Dragon at the [facility] for colonoscopy for rectal bleeding.   Remote colonoscopy. Reported prior history of mild anemia with normal iron indices. He has occasional brbpr. He has thalassemia trait felt to account for his anemia per records. History of GERD well controlled.   Today he states he has had intermittent brbpr for about a year. Sees red blood in the water. Usually only happens with constipation. Several weeks in between episodes. If no good consistent BM in 3-4 days will start Miralax. Miralax works very well. Heartburn well controlled on omeprazole. Tried to wean off PPI with dietary changes but didn't work well, working with doctors with this. On PPI couple of years. Follows with GI doctor at the [place]. No dysphagia. Prior EGD, found a "pocket". Denies abdominal pain. No N/V. No unintentional weight loss.   Labs from 12/2022: Hgb normal. See below. Labs from August 2022: Hemoglobin 13.3, MCV 73.2. Labs from November 2023: Hematocrit 42.8  Patient admitted to [facility] in 12/2022 with acute systolic heart failure. He had left heart cath that was unremarkable and right heart cath demonstrated preserved cardiac output. Last seen in heart failure clinic in 06/2023. He is on Entresto, toprol, spironolactone, jardiance. ECHO 06/2023: LVEF 40-45%. Zio patch completed. He has follow up with Dr. Gasper Lloyd next week.   Medications   Current Outpatient Medications  Medication  Sig Dispense Refill   allopurinol (ZYLOPRIM) 100 MG tablet Take 100 mg by mouth daily.     dapagliflozin propanediol (FARXIGA) 10 MG TABS tablet Take 1 tablet (10 mg total) by mouth daily. 30 tablet 11   furosemide (LASIX) 40 MG tablet Take 1 tablet (40 mg total) by mouth daily as needed (weight gain (3lbs in 1 day or 5lbs in 1 week) or worsening swelling/edema). 30 tablet 1   levothyroxine (SYNTHROID) 200 MCG tablet Take 200 mcg by mouth daily before breakfast.     metoprolol succinate (TOPROL XL) 25 MG 24 hr tablet Take 1 tablet (25 mg total) by mouth at bedtime. 90 tablet 3   omeprazole (PRILOSEC) 20 MG capsule Take 20 mg by mouth daily.     polyethylene glycol powder (GLYCOLAX/MIRALAX) 17 GM/SCOOP powder Take 1 Container by mouth daily as needed.     potassium chloride SA (KLOR-CON M) 20 MEQ tablet Take 1 tablet (20 mEq total) by mouth daily as needed (when you need to take the Lasix). 30 tablet 1   sacubitril-valsartan (ENTRESTO) 49-51 MG Take 1 tablet by mouth 2 (two) times daily. 60 tablet 6   sildenafil (VIAGRA) 100 MG tablet Take 0.5 tablets (50 mg total) by mouth daily as needed for erectile dysfunction. 10 tablet 0   spironolactone (ALDACTONE) 25 MG tablet Take 1 tablet (25 mg total) by mouth daily. 30 tablet 6   No current facility-administered medications for this visit.    Allergies   Allergies as of 08/30/2023 -  Review Complete 08/30/2023  Allergen Reaction Noted   Other Other (See Comments) 03/28/2016   Shellfish allergy Itching 03/14/2022    Past Medical History   Past Medical History:  Diagnosis Date   Cancer (HCC)    thyroid   Gout    HFrEF (heart failure with reduced ejection fraction) (HCC)    a. EF 30-35% in 12/2022 with cath showing no evidence of CAD   Horner's syndrome    Thalassemia trait     Past Surgical History   Past Surgical History:  Procedure Laterality Date   EYE SURGERY     RIGHT/LEFT HEART CATH AND CORONARY ANGIOGRAPHY N/A 01/03/2023    Procedure: RIGHT/LEFT HEART CATH AND CORONARY ANGIOGRAPHY;  Surgeon: Kathleene Hazel, MD;  Location: [facility] INVASIVE CV LAB;  Service: Cardiovascular;  Laterality: N/A;   THYROIDECTOMY Bilateral     Past Family History   Family History  Problem Relation Age of Onset   Valvular heart disease Mother    Stroke Father    Heart murmur Brother    Atrial fibrillation Brother    Cancer - Colon Neg Hx     Past Social History   Social History   Socioeconomic History   Marital status: Married    Spouse name: Emmily   Number of children: 8   Years of education: Not on file   Highest education level: High school graduate  Occupational History   Occupation: [employer]  Tobacco Use   Smoking status: Former    Current packs/day: 0.00    Types: Cigarettes    Quit date: 1996    Years since quitting: 28.7   Smokeless tobacco: Never  Vaping Use   Vaping status: Never Used  Substance and Sexual Activity   Alcohol use: Not Currently    Alcohol/week: 2.0 standard drinks of alcohol    Types: 2 Glasses of wine per week   Drug use: Not Currently   Sexual activity: Not on file  Other Topics Concern   Not on file  Social History Narrative   Not on file   Social Determinants of Health   Financial Resource Strain: Low Risk  (01/04/2023)   Overall Financial Resource Strain (CARDIA)    Difficulty of Paying Living Expenses: Not very hard  Food Insecurity: No Food Insecurity (01/01/2023)   Hunger Vital Sign    Worried About Running Out of Food in the Last Year: Never true    Ran Out of Food in the Last Year: Never true  Transportation Needs: No Transportation Needs (01/01/2023)   PRAPARE - Administrator, Civil Service (Medical): No    Lack of Transportation (Non-Medical): No  Physical Activity: Not on file  Stress: Not on file  Social Connections: Unknown (04/28/2022)   Received from [facility], [facility]   Social Network    Social Network: Not on file  Intimate  Partner Violence: Not At Risk (01/01/2023)   Humiliation, Afraid, Rape, and Kick questionnaire    Fear of Current or Ex-Partner: No    Emotionally Abused: No    Physically Abused: No    Sexually Abused: No    Review of Systems   General: Negative for anorexia, weight loss, fever, chills, fatigue, weakness. Eyes: Negative for vision changes.  ENT: Negative for hoarseness, difficulty swallowing , nasal congestion. CV: Negative for chest pain, angina, palpitations, dyspnea on exertion, peripheral edema.  Respiratory: Negative for dyspnea at rest, dyspnea on exertion, cough, sputum, wheezing.  GI: See history of present  illness. GU:  Negative for dysuria, hematuria, urinary incontinence, urinary frequency, nocturnal urination.  MS: Negative for joint pain, low back pain.  Derm: Negative for rash or itching.  Neuro: Negative for weakness, abnormal sensation, seizure, frequent headaches, memory loss,  confusion.  Psych: Negative for anxiety, depression, suicidal ideation, hallucinations.  Endo: Negative for unusual weight change.  Heme: Negative for bruising or bleeding. Allergy: Negative for rash or hives.  Physical Exam   BP 121/81 (BP Location: Right Arm, Patient Position: Sitting, Cuff Size: Large)   Pulse 60   Temp (!) 97.4 F (36.3 C) (Temporal)   Ht 5\' 11"  (1.803 m)   Wt 230 lb 3.2 oz (104.4 kg)   SpO2 99%   BMI 32.11 kg/m    General: Well-nourished, well-developed in no acute distress.  Head: Normocephalic, atraumatic.   Eyes: Conjunctiva pink, no icterus. Mouth: Oropharyngeal mucosa moist and pink  Neck: Supple without thyromegaly, masses, or lymphadenopathy.  Lungs: Clear to auscultation bilaterally.  Heart: Regular rate and rhythm, no murmurs rubs or gallops.  Abdomen: Bowel sounds are normal, nontender, nondistended, no hepatosplenomegaly or masses,  no abdominal bruits or hernia, no rebound or guarding.   Rectal: not performed Extremities: No lower extremity  edema. No clubbing or deformities.  Neuro: Alert and oriented x 4 , grossly normal neurologically.  Skin: Warm and dry, no rash or jaundice.   Psych: Alert and cooperative, normal mood and affect.  Labs   Lab Results  Component Value Date   NA 134 (L) 07/01/2023   CL 104 07/01/2023   K 4.3 07/01/2023   CO2 22 07/01/2023   BUN 21 (H) 07/01/2023   CREATININE 1.31 (H) 07/01/2023   GFRNONAA >60 07/01/2023   CALCIUM 9.1 07/01/2023   PHOS 4.0 01/01/2023   ALBUMIN 3.7 01/02/2023   GLUCOSE 107 (H) 07/01/2023   Lab Results  Component Value Date   ALT 22 01/02/2023   AST 22 01/02/2023   ALKPHOS 52 01/02/2023   BILITOT 0.9 01/02/2023   Lab Results  Component Value Date   WBC 5.4 01/04/2023   HGB 14.6 01/04/2023   HCT 45.1 01/04/2023   MCV 73.2 (L) 01/04/2023   PLT 211 01/04/2023   Lab Results  Component Value Date   TSH 1.629 01/03/2023    Imaging Studies   No results found.  Assessment   *Rectal bleeding *Constipation  Plan for colonoscopy. Suspect benign anorectal source such as hemorrhoids but polyps, malignancy need to be excluded.   Miralax works well for patient, encouraged more regular use at least take dose if no BM in 48 hours as opposed to waiting several days to start.   PLAN   Colonoscopy in near future with Dr. Levon Hedger. ASA 3.  I have discussed the risks, alternatives, benefits with regards to but not limited to the risk of reaction to medication, bleeding, infection, perforation and the patient is agreeable to proceed. Written consent to be obtained.    Leanna Battles. Melvyn Neth, MHS, PA-C [facility]   I have reviewed the note and agree with the APP's assessment as described in this progress note  Katrinka Blazing, MD Gastroenterology and Hepatology [facility]

## 2023-08-30 NOTE — Patient Instructions (Addendum)
Colonoscopy to be scheduled. See separate instructions.  Stay on top of your constipation especially the couple of weeks prior to your colonoscopy. If you go more than 48 hours without a good stool, take 1-2 packets of miralax in 10 ounces of water daily.

## 2023-09-02 ENCOUNTER — Other Ambulatory Visit (HOSPITAL_COMMUNITY): Payer: Self-pay

## 2023-09-03 ENCOUNTER — Other Ambulatory Visit: Payer: Self-pay

## 2023-09-04 ENCOUNTER — Encounter (HOSPITAL_COMMUNITY): Payer: Self-pay | Admitting: Cardiology

## 2023-09-04 ENCOUNTER — Encounter: Payer: Self-pay | Admitting: *Deleted

## 2023-09-04 ENCOUNTER — Ambulatory Visit (HOSPITAL_COMMUNITY)
Admission: RE | Admit: 2023-09-04 | Discharge: 2023-09-04 | Disposition: A | Discharge status: Home / Self Care | Payer: No Typology Code available for payment source | Source: Ambulatory Visit | Attending: Cardiology | Admitting: Cardiology

## 2023-09-04 ENCOUNTER — Telehealth: Payer: Self-pay | Admitting: *Deleted

## 2023-09-04 VITALS — BP 108/68 | HR 74 | Wt 230.0 lb

## 2023-09-04 DIAGNOSIS — I428 Other cardiomyopathies: Secondary | ICD-10-CM | POA: Insufficient documentation

## 2023-09-04 DIAGNOSIS — I5020 Unspecified systolic (congestive) heart failure: Secondary | ICD-10-CM | POA: Insufficient documentation

## 2023-09-04 DIAGNOSIS — K625 Hemorrhage of anus and rectum: Secondary | ICD-10-CM | POA: Insufficient documentation

## 2023-09-04 DIAGNOSIS — R6882 Decreased libido: Secondary | ICD-10-CM | POA: Diagnosis not present

## 2023-09-04 DIAGNOSIS — E890 Postprocedural hypothyroidism: Secondary | ICD-10-CM | POA: Insufficient documentation

## 2023-09-04 DIAGNOSIS — I5022 Chronic systolic (congestive) heart failure: Secondary | ICD-10-CM | POA: Diagnosis not present

## 2023-09-04 DIAGNOSIS — I493 Ventricular premature depolarization: Secondary | ICD-10-CM | POA: Diagnosis not present

## 2023-09-04 DIAGNOSIS — Z79890 Hormone replacement therapy (postmenopausal): Secondary | ICD-10-CM | POA: Insufficient documentation

## 2023-09-04 DIAGNOSIS — G4730 Sleep apnea, unspecified: Secondary | ICD-10-CM | POA: Insufficient documentation

## 2023-09-04 LAB — BASIC METABOLIC PANEL WITH GFR
Anion gap: 6 (ref 5–15)
BUN: 10 mg/dL (ref 6–20)
CO2: 26 mmol/L (ref 22–32)
Calcium: 9.2 mg/dL (ref 8.9–10.3)
Chloride: 103 mmol/L (ref 98–111)
Creatinine, Ser: 1.11 mg/dL (ref 0.61–1.24)
GFR, Estimated: >60 mL/min (ref >60)
Glucose, Bld: 81 mg/dL (ref 70–99)
Potassium: 3.8 mmol/L (ref 3.5–5.1)
Sodium: 135 mmol/L (ref 135–145)

## 2023-09-04 LAB — BRAIN NATRIURETIC PEPTIDE: B Natriuretic Peptide: 17.5 pg/mL (ref 0.0–100.0)

## 2023-09-04 MED ORDER — METOPROLOL SUCCINATE ER 50 MG PO TB24
50.0000 mg | ORAL_TABLET | Freq: Every day | ORAL | 5 refills | Status: DC
Start: 2023-09-04 — End: 2024-03-30

## 2023-09-04 NOTE — Telephone Encounter (Signed)
[place] contact person/ is aware.

## 2023-09-04 NOTE — Patient Instructions (Signed)
INCREASE Toprol 50 mg nightly.  Labs done today, your results will be available in MyChart, we will contact you for abnormal readings.  Your physician has requested that you have an echocardiogram. Echocardiography is a painless test that uses sound waves to create images of your heart. It provides your doctor with information about the size and shape of your heart and how well your heart's chambers and valves are working. This procedure takes approximately one hour. There are no restrictions for this procedure. Please do NOT wear cologne, perfume, aftershave, or lotions (deodorant is allowed). Please arrive 15 minutes prior to your appointment time.  Your physician recommends that you schedule a follow-up appointment in: 4 months ( January 2025) ** PLEASE CALL THE OFFICE IN NOVEMBER TO ARRANGE YOUR FOLLOW UP APPOINTMENT. **  If you have any questions or concerns before your next appointment please send [place] a message through [place] or call our office at [phone].    TO LEAVE A MESSAGE FOR THE NURSE SELECT OPTION 2, PLEASE LEAVE A MESSAGE INCLUDING: YOUR NAME DATE OF BIRTH CALL BACK NUMBER REASON FOR CALL**this is important as we prioritize the call backs  YOU WILL RECEIVE A CALL BACK THE SAME DAY AS LONG AS YOU CALL BEFORE 4:00 PM  At the [facility], you and your health needs are our priority. As part of our continuing mission to provide you with exceptional heart care, we have created designated Provider Care Teams. These Care Teams include your primary Cardiologist (physician) and Advanced Practice Providers (APPs- Physician Assistants and Nurse Practitioners) who all work together to provide you with the care you need, when you need it.   You may see any of the following providers on your designated Care Team at your next follow up: Dr Arvilla Meres Dr Marca Ancona Dr. Marcos Eke, NP Robbie Lis, [place] [facility] [place],  [place] Brynda Peon, NP Karle Plumber, PharmD   Please be sure to bring in all your medications bottles to every appointment.    Thank you for choosing [facility]

## 2023-09-04 NOTE — Progress Notes (Signed)
ADVANCED HEART FAILURE CLINIC NOTE  Referring Physician: Clinic, [facility] Sink  Primary Care: Erick Colace, MD Primary Cardiologist: Dr. Isabel Caprice  HPI: Elijah Perez. is a 57 y.o. male with heart failure with reduced ejection fraction secondary to nonischemic cardiomyopathy, thyroid cancer status post thyroidectomy in 2017, history of COVID-19 in 2023, GERD presenting today to establish care.  Elijah Perez cardiac history dates back to January 01, 2023 when he was admitted to [facility] with acute systolic heart failure.  He had a left heart cath that was unremarkable and right heart cath that demonstrated preserved cardiac output.  He was started on low-dose GDMT and since that time is followed up in [facility] clinic where GDMT was further uptitrated. He ran a 5K in November and although he completed it he felt much more fatigued than usual. Prior to this he could run a 64m mile. No supplements for exercise other than protein.   Interval history - Back to lifting weights; doing very well from a functional standpoint. Only complaints today are slicing the golf ball. Short game is great though.   Activity level/exercise tolerance: NYHA II Orthopnea:  Sleeps flat Paroxysmal noctural dyspnea:  No Chest pain/pressure:  No Orthostatic lightheadedness:  No Palpitations: improved significantly.  Lower extremity edema:  No Presyncope/syncope:  No Cough:  No  Past Medical History:  Diagnosis Date   Cancer (HCC)    thyroid   Gout    HFrEF (heart failure with reduced ejection fraction) (HCC)    a. EF 30-35% in 12/2022 with cath showing no evidence of CAD   Horner's syndrome    Thalassemia trait     Current Outpatient Medications  Medication Sig Dispense Refill   allopurinol (ZYLOPRIM) 100 MG tablet Take 100 mg by mouth daily.     dapagliflozin propanediol (FARXIGA) 10 MG TABS tablet Take 1 tablet (10 mg total) by mouth daily. 30 tablet 11   furosemide (LASIX) 40 MG tablet Take  1 tablet (40 mg total) by mouth daily as needed (weight gain (3lbs in 1 day or 5lbs in 1 week) or worsening swelling/edema). 30 tablet 1   levothyroxine (SYNTHROID) 200 MCG tablet Take 200 mcg by mouth daily before breakfast.     metoprolol succinate (TOPROL XL) 25 MG 24 hr tablet Take 1 tablet (25 mg total) by mouth at bedtime. 90 tablet 3   omeprazole (PRILOSEC) 20 MG capsule Take 20 mg by mouth daily.     polyethylene glycol powder (GLYCOLAX/MIRALAX) 17 GM/SCOOP powder Take 1 Container by mouth daily as needed.     potassium chloride SA (KLOR-CON M) 20 MEQ tablet Take 1 tablet (20 mEq total) by mouth daily as needed (when you need to take the Lasix). 30 tablet 1   sacubitril-valsartan (ENTRESTO) 49-51 MG Take 1 tablet by mouth 2 (two) times daily. 60 tablet 6   sildenafil (VIAGRA) 100 MG tablet Take 0.5 tablets (50 mg total) by mouth daily as needed for erectile dysfunction. 10 tablet 0   spironolactone (ALDACTONE) 25 MG tablet Take 1 tablet (25 mg total) by mouth daily. 30 tablet 6   No current facility-administered medications for this visit.    Allergies  Allergen Reactions   Other Other (See Comments)    Pet dander, pollen -> eyes itchy/watery, sneezing   Shellfish Allergy Itching      Social History   Socioeconomic History   Marital status: Married    Spouse name: Emmily   Number of children: 8  Years of education: Not on file   Highest education level: High school graduate  Occupational History   Occupation: [employer]  Tobacco Use   Smoking status: Former    Current packs/day: 0.00    Types: Cigarettes    Quit date: 1996    Years since quitting: 28.7   Smokeless tobacco: Never  Vaping Use   Vaping status: Never Used  Substance and Sexual Activity   Alcohol use: Not Currently    Alcohol/week: 2.0 standard drinks of alcohol    Types: 2 Glasses of wine per week   Drug use: Not Currently   Sexual activity: Not on file  Other Topics Concern   Not on file  Social  History Narrative   Not on file   Social Determinants of Health   Financial Resource Strain: Low Risk  (01/04/2023)   Overall Financial Resource Strain (CARDIA)    Difficulty of Paying Living Expenses: Not very hard  Food Insecurity: No Food Insecurity (01/01/2023)   Hunger Vital Sign    Worried About Running Out of Food in the Last Year: Never true    Ran Out of Food in the Last Year: Never true  Transportation Needs: No Transportation Needs (01/01/2023)   PRAPARE - Administrator, Civil Service (Medical): No    Lack of Transportation (Non-Medical): No  Physical Activity: Not on file  Stress: Not on file  Social Connections: Unknown (04/28/2022)   [facility], [facility]   Social Network    Social Network: Not on file  Intimate Partner Violence: Not At Risk (01/01/2023)   Humiliation, Afraid, Rape, and Kick questionnaire    Fear of Current or Ex-Partner: No    Emotionally Abused: No    Physically Abused: No    Sexually Abused: No      Family History  Problem Relation Age of Onset   Valvular heart disease Mother    Stroke Father    Heart murmur Brother    Atrial fibrillation Brother    Cancer - Colon Neg Hx     PHYSICAL EXAM: There were no vitals filed for this visit. GENERAL: Well nourished, well developed, and in no apparent distress at rest.  HEENT: Negative for arcus senilis or xanthelasma. There is no scleral icterus.  The mucous membranes are pink and moist.   NECK: Supple, No masses. Normal carotid upstrokes without bruits. No masses or thyromegaly.    CHEST: There are no chest wall deformities. There is no chest wall tenderness. Respirations are unlabored.  Lungs- CTA B/L CARDIAC:  JVP: 7 cm          Normal rate with regular rhythm. No murmurs, rubs or gallops.  Pulses are 2+ and symmetrical in upper and lower extremities. No edema.  ABDOMEN: Soft, non-tender, non-distended. There are no masses or hepatomegaly. There are normal bowel  sounds.  EXTREMITIES: Warm and well perfused with no cyanosis, clubbing.  LYMPHATIC: No axillary or supraclavicular lymphadenopathy.  NEUROLOGIC: Patient is oriented x3 with no focal or lateralizing neurologic deficits.  PSYCH: Patients affect is appropriate, there is no evidence of anxiety or depression.  SKIN: Warm and dry; no lesions or wounds.     DATA REVIEW  ECG: 01/11/23: NSR with PVC.  07/01/23: NSR w/ frequent PVCs.   ECHO: 01/01/23: LVEF 30 to 35% with global hypokinesis.  Normal RV function.  Severely dilated left atrium. 03/18/2023: LVEF 35% with global hypokinesis 07/01/23: LVEF 40-45%  CATH: 01/03/23: No angiographic evidence of  CAD Normal right and left heart pressures (RA 1, RV 22/0/2, PA 23/5 mean 12, PCWP 7, LV 102/3/9, AO 108/71, CO 4.97 L/min, CI 2.22)  CMR: 04/30/23:  1.  Mild LV dilatation with moderate systolic dysfunction (EF 35%)  2.  Normal RV size and systolic dysfunction (EF 50%)  3. RV insertion site LGE, which is a nonspecific scar pattern often seen in setting of elevated pulmonary pressures   ASSESSMENT & PLAN:  Heart failure with reduced ejection fraction Etiology of HF: Nonischemic cardiomyopathy, left heart cath as demonstrated above with no evidence of CAD. Reports frequent palpitations prior to dx of HFrEF, enlarged LA on TTE. Ziopatch without r tachyarrhythmias. Otherwise, no significant FH. Has lifted heavy weights for years; however, no reported use of anabolic steroids or other products aside from protein/creatine.  NYHA class / AHA Stage:NYHA II Volume status & Diuretics: euvolemic; has not required lasix for several weeks now.  Vasodilators: Entresto 49/51mg  BID Beta-Blocker: increase metoprolol 50mg   MRA: spironolactone 12.5mg  Cardiometabolic:continue jardiance Devices therapies & Valvulopathies:not indicated Advanced therapies:not indicated.   2. Thyroid Cancer S/P thyroidectomy  - Levothyroxine daily.  - S/P radiation and  radioactive iodone.  - No chemotherapy.   3. Sleep Apnea - reports that it was very mild and CPAP was not advised.   4. Frequent PVC - ziopatch previously with PVC burden of 6% - Repeat ziopatch with close to 10% PVC burden  5. Decreased libido - Will need to uptitrate metoprolol dose - start sildenafil prn.   6. Rectal bleeding - Planning on colonoscopy; followed by GI.   Personally reviewed echocardiogram, ECGs, imaging; reviewed this with patient.   Elijah Perez Advanced Heart Failure Mechanical Circulatory Support

## 2023-09-04 NOTE — Telephone Encounter (Signed)
Spoke with pt. Scheduled TCS with Dr. Levon Hedger ASA 3 10/1. Aware will need pre-op prior and will inform when scheduled. He already has moviprep at home. Will send instructions via mychart.   FYI so [facility] Pikes can let [facility] know.

## 2023-09-11 ENCOUNTER — Encounter (HOSPITAL_COMMUNITY): Payer: Self-pay

## 2023-09-12 ENCOUNTER — Encounter (HOSPITAL_COMMUNITY)
Admission: RE | Admit: 2023-09-12 | Discharge: 2023-09-12 | Disposition: A | Discharge status: Home / Self Care | Payer: No Typology Code available for payment source | Source: Ambulatory Visit | Attending: Gastroenterology | Admitting: Gastroenterology

## 2023-09-16 ENCOUNTER — Encounter (HOSPITAL_COMMUNITY): Payer: Self-pay | Admitting: Cardiology

## 2023-09-16 ENCOUNTER — Other Ambulatory Visit (HOSPITAL_COMMUNITY): Payer: Self-pay

## 2023-09-17 ENCOUNTER — Encounter (HOSPITAL_COMMUNITY): Payer: Self-pay | Admitting: Gastroenterology

## 2023-09-17 ENCOUNTER — Encounter (HOSPITAL_COMMUNITY)
Admission: RE | Disposition: A | Discharge status: Home / Self Care | Payer: Self-pay | Source: Home / Self Care | Attending: Gastroenterology

## 2023-09-17 ENCOUNTER — Ambulatory Visit (HOSPITAL_COMMUNITY): Payer: No Typology Code available for payment source | Admitting: Anesthesiology

## 2023-09-17 ENCOUNTER — Ambulatory Visit (HOSPITAL_COMMUNITY)
Admission: RE | Admit: 2023-09-17 | Discharge: 2023-09-17 | Disposition: A | Discharge status: Home / Self Care | Payer: No Typology Code available for payment source | Attending: Gastroenterology | Admitting: Gastroenterology

## 2023-09-17 DIAGNOSIS — G902 Horner's syndrome: Secondary | ICD-10-CM | POA: Diagnosis not present

## 2023-09-17 DIAGNOSIS — I509 Heart failure, unspecified: Secondary | ICD-10-CM | POA: Insufficient documentation

## 2023-09-17 DIAGNOSIS — K648 Other hemorrhoids: Secondary | ICD-10-CM

## 2023-09-17 DIAGNOSIS — K644 Residual hemorrhoidal skin tags: Secondary | ICD-10-CM | POA: Diagnosis not present

## 2023-09-17 DIAGNOSIS — Z7984 Long term (current) use of oral hypoglycemic drugs: Secondary | ICD-10-CM | POA: Insufficient documentation

## 2023-09-17 DIAGNOSIS — E039 Hypothyroidism, unspecified: Secondary | ICD-10-CM | POA: Diagnosis not present

## 2023-09-17 DIAGNOSIS — D563 Thalassemia minor: Secondary | ICD-10-CM | POA: Insufficient documentation

## 2023-09-17 DIAGNOSIS — Z87891 Personal history of nicotine dependence: Secondary | ICD-10-CM | POA: Insufficient documentation

## 2023-09-17 DIAGNOSIS — K5730 Diverticulosis of large intestine without perforation or abscess without bleeding: Secondary | ICD-10-CM

## 2023-09-17 DIAGNOSIS — K625 Hemorrhage of anus and rectum: Secondary | ICD-10-CM

## 2023-09-17 DIAGNOSIS — K219 Gastro-esophageal reflux disease without esophagitis: Secondary | ICD-10-CM | POA: Insufficient documentation

## 2023-09-17 DIAGNOSIS — K5900 Constipation, unspecified: Secondary | ICD-10-CM | POA: Diagnosis not present

## 2023-09-17 HISTORY — PX: COLONOSCOPY WITH PROPOFOL: SHX5780

## 2023-09-17 LAB — HM COLONOSCOPY

## 2023-09-17 SURGERY — COLONOSCOPY WITH PROPOFOL
Anesthesia: General

## 2023-09-17 MED ORDER — PROPOFOL 500 MG/50ML IV EMUL
INTRAVENOUS | Status: DC | PRN
  Administered 2023-09-17: 150 ug/kg/min via INTRAVENOUS

## 2023-09-17 MED ORDER — LACTATED RINGERS IV SOLN: INTRAVENOUS | Status: DC | PRN

## 2023-09-17 MED ORDER — PROPOFOL 10 MG/ML IV BOLUS
INTRAVENOUS | Status: DC | PRN
  Administered 2023-09-17: 100 mg via INTRAVENOUS

## 2023-09-17 MED ORDER — STERILE WATER FOR IRRIGATION IR SOLN
Status: DC | PRN
  Administered 2023-09-17: 100 mL

## 2023-09-17 MED ORDER — LIDOCAINE HCL (CARDIAC) PF 100 MG/5ML IV SOSY
PREFILLED_SYRINGE | INTRAVENOUS | Status: DC | PRN
  Administered 2023-09-17: 80 mg via INTRAVENOUS

## 2023-09-17 NOTE — Transfer of Care (Signed)
Immediate Anesthesia Transfer of Care Note  Patient: Elijah Perez.  Procedure(s) Performed: COLONOSCOPY WITH PROPOFOL  Patient Location: Short Stay  Anesthesia Type:General  Level of Consciousness: drowsy and patient cooperative  Airway & Oxygen Therapy: Patient Spontanous Breathing and Patient connected to nasal cannula oxygen  Post-op Assessment: Report given to RN and Post -op Vital signs reviewed and stable  Post vital signs: Reviewed and stable  Last Vitals:  Vitals Value Taken Time  BP 127/63 09/17/23 1315  Temp 36.4 C 09/17/23 1311  Pulse 72 09/17/23 1315  Resp 16 09/17/23 1315  SpO2 100 % 09/17/23 1315    Last Pain:  Vitals:   09/17/23 1315  TempSrc:   PainSc: 0-No pain         Complications: No notable events documented.

## 2023-09-17 NOTE — Anesthesia Postprocedure Evaluation (Signed)
Anesthesia Post Note  Patient: Elijah Perez.  Procedure(s) Performed: COLONOSCOPY WITH PROPOFOL  Patient location during evaluation: PACU Anesthesia Type: General Level of consciousness: awake and alert Pain management: pain level controlled Vital Signs Assessment: post-procedure vital signs reviewed and stable Respiratory status: spontaneous breathing, nonlabored ventilation, respiratory function stable and patient connected to nasal cannula oxygen Cardiovascular status: blood pressure returned to baseline and stable Postop Assessment: no apparent nausea or vomiting Anesthetic complications: no   There were no known notable events for this encounter.   Last Vitals:  Vitals:   09/17/23 1311 09/17/23 1315  BP: (!) 96/57 127/63  Pulse: 66 72  Resp: 18 16  Temp: (!) 36.4 C   SpO2: 100% 100%    Last Pain:  Vitals:   09/17/23 1315  TempSrc:   PainSc: 0-No pain                 Sandhya Denherder L Adrianne Shackleton

## 2023-09-17 NOTE — Anesthesia Preprocedure Evaluation (Signed)
Anesthesia Evaluation  Patient identified by MRN, date of birth, ID band Patient awake    Reviewed: Allergy & Precautions, H&P , NPO status , Patient's Chart, lab work & pertinent test results, reviewed documented beta blocker date and time   Airway Mallampati: II  TM Distance: >3 FB Neck ROM: full    Dental no notable dental hx.    Pulmonary neg pulmonary ROS, former smoker   Pulmonary exam normal breath sounds clear to auscultation       Cardiovascular Exercise Tolerance: Good +CHF   Rhythm:regular Rate:Normal  EF 40%.  Grade 1 diastolic dysfunction.  Functionally doing well.   Neuro/Psych Horner's syndrome  negative psych ROS   GI/Hepatic Neg liver ROS,GERD  ,,  Endo/Other  Hypothyroidism    Renal/GU negative Renal ROS  negative genitourinary   Musculoskeletal   Abdominal Normal abdominal exam  (+)   Peds  Hematology negative hematology ROS (+)   Anesthesia Other Findings   Reproductive/Obstetrics negative OB ROS                             Anesthesia Physical Anesthesia Plan  ASA: 3  Anesthesia Plan: General   Post-op Pain Management: Minimal or no pain anticipated   Induction: Intravenous  PONV Risk Score and Plan: Propofol infusion  Airway Management Planned: Nasal Cannula and Natural Airway  Additional Equipment:   Intra-op Plan:   Post-operative Plan:   Informed Consent: I have reviewed the patients History and Physical, chart, labs and discussed the procedure including the risks, benefits and alternatives for the proposed anesthesia with the patient or authorized representative who has indicated his/her understanding and acceptance.     Dental Advisory Given  Plan Discussed with: CRNA  Anesthesia Plan Comments:         Anesthesia Quick Evaluation

## 2023-09-17 NOTE — Interval H&P Note (Signed)
History and Physical Interval Note:  09/17/2023 10:50 AM  Elijah Perez.  has presented today for surgery, with the diagnosis of RB.  The various methods of treatment have been discussed with the patient and family. After consideration of risks, benefits and other options for treatment, the patient has consented to  Procedure(s) with comments: COLONOSCOPY WITH PROPOFOL (N/A) - 11:30am, asa 3 as a surgical intervention.  The patient's history has been reviewed, patient examined, no change in status, stable for surgery.  I have reviewed the patient's chart and labs.  Questions were answered to the patient's satisfaction.     Katrinka Blazing Mayorga

## 2023-09-17 NOTE — Op Note (Addendum)
[facility] Patient Name: Elijah Perez Procedure Date: 09/17/2023 12:31 PM MRN: [id number] Date of Birth: Feb 07, 1966 Attending MD: Katrinka Blazing , , [id number] CSN: [id number] Age: 57 Admit Type: Outpatient Procedure:                Colonoscopy Indications:              Rectal bleeding Providers:                Katrinka Blazing, Angelica Ran, Elinor Parkinson Referring MD:              Medicines:                Monitored Anesthesia Care Complications:            No immediate complications. Estimated Blood Loss:     Estimated blood loss: none. Procedure:                Pre-Anesthesia Assessment:                           - Prior to the procedure, a History and Physical                            was performed, and patient medications, allergies                            and sensitivities were reviewed. The patient's                            tolerance of previous anesthesia was reviewed.                           - The risks and benefits of the procedure and the                            sedation options and risks were discussed with the                            patient. All questions were answered and informed                            consent was obtained.                           - ASA Grade Assessment: II - A patient with mild                            systemic disease.                           After obtaining informed consent, the colonoscope                            was passed under direct vision. Throughout the                            procedure, the patient's blood pressure, pulse, and  oxygen saturations were monitored continuously. The                            [id number]-[id number] ([id number]) scope was introduced through                            the anus and advanced to the the terminal ileum.                            The colonoscopy was performed without difficulty.                            The patient tolerated the procedure well.  The                            quality of the bowel preparation was adequate. Scope In: 12:51:05 PM Scope Out: 1:06:39 PM Scope Withdrawal Time: 0 hours 11 minutes 16 seconds  Total Procedure Duration: 0 hours 15 minutes 34 seconds  Findings:      The perianal and digital rectal examinations were normal.      The terminal ileum appeared normal.      Multiple medium-mouthed and small-mouthed diverticula were found in the       sigmoid colon, descending colon and transverse colon.      Non-bleeding external and internal hemorrhoids were found during       retroflexion. The hemorrhoids were medium-sized. Impression:               - The examined portion of the ileum was normal.                           - Diverticulosis in the sigmoid colon, in the                            descending colon and in the transverse colon.                           - Non-bleeding external and internal hemorrhoids.                           - No specimens collected. Moderate Sedation:      Per Anesthesia Care Recommendation:           - Discharge patient to home (ambulatory).                           - Resume previous diet.                           - Repeat colonoscopy in 10 years for screening                            purposes.                           - Start taking one capful of Miralax every day to  avoid constipation, uptitrate up to 3 capfuls per                            day as needed Procedure Code(s):        --- Professional ---                           [phone], Colonoscopy, flexible; diagnostic, including                            collection of specimen(s) by brushing or washing,                            when performed (separate procedure) Diagnosis Code(s):        --- Professional ---                           K64.8, Other hemorrhoids                           K62.5, Hemorrhage of anus and rectum                           K57.30, Diverticulosis of large intestine  without                            perforation or abscess without bleeding CPT copyright 2022 [employer]. All rights reserved. The codes documented in this report are preliminary and upon coder review may  be revised to meet current compliance requirements. Katrinka Blazing, MD Katrinka Blazing,  09/17/2023 1:13:02 PM This report has been signed electronically. Number of Addenda: 0

## 2023-09-17 NOTE — Discharge Instructions (Addendum)
You are being discharged to home.  Resume your previous diet.  Your physician has recommended a repeat colonoscopy in 10 years for screening purposes.  

## 2023-09-18 ENCOUNTER — Encounter (INDEPENDENT_AMBULATORY_CARE_PROVIDER_SITE_OTHER): Payer: Self-pay | Admitting: *Deleted

## 2023-09-20 ENCOUNTER — Ambulatory Visit: Payer: No Typology Code available for payment source | Attending: Student | Admitting: Student

## 2023-09-20 NOTE — Progress Notes (Deleted)
Cardiology Office Note    Date:  09/20/2023  ID:  [name], DOB [birth year], MRN [id number] Cardiologist: Dina Rich, MD   AHF: Dr. Gasper Lloyd  History of Present Illness:    Elijah Sogge. is a 57 y.o. male with past medical history of HFrEF (EF 30-35% in 12/2022 with cath showing no evidence of CAD), thyroid cancer (s/p thyroidectomy in 2017) and GERD who presents to the office today for 60-month follow-up.  He was examined by myself in 04/2023 and was walking a mile several days each week without anginal symptoms. He did report having worsening dizziness with positional changes following recent dose adjustment of Entresto and Spironolactone. Medications were not further titrated given this and he was continued on Entresto 49-51 mg twice daily, Farxiga 10 mg daily and Spironolactone 25 mg daily. He did report a significant decrease in libido since being started on beta-blocker therapy, therefore Toprol-XL was reduced from 25 mg daily to 12.5 mg daily to see if this would help with his symptoms. He did follow-up with Advanced Heart Failure on 07/01/2023 and reported still having decreased libido, therefore Toprol-XL was increased back to 25 mg daily and he was started on Viagra. Follow-up labs did show that his creatinine had increased from 1.04 to 1.31 and he was encouraged to increase his fluid consumption. Repeat echocardiogram did show that his EF had improved to 40 to 45% with RV function being normal. He also wore a Zio patch which showed predominantly normal sinus rhythm with an average heart rate of 70 bpm.  He did have 166 episodes of VT with the longest lasting for 20 beats. He had a 8.2% PVC burden and 1.5% couplet burden.  He missed his appointment in 08/2023 but did follow-up with advanced heart failure later that month and was back to lifting weights and denied any recent anginal symptoms.  Toprol-XL was increased to 50 mg daily and he was continued on Entresto,  spironolactone and Jardiance.  It was recommend to obtain a follow-up echocardiogram in 4 months.  - ***  Studies Reviewed:   EKG: EKG is*** ordered today and demonstrates ***   EKG Interpretation Date/Time:    Ventricular Rate:    PR Interval:    QRS Duration:    QT Interval:    QTC Calculation:   R Axis:      Text Interpretation:         R/LHC: 12/2022 No angiographic evidence of CAD Normal right and left heart pressures (RA 1, RV 22/0/2, PA 23/5 mean 12, PCWP 7, LV 102/3/9, AO 108/71, CO 4.97 L/min, CI 2.22)   Recommendations: Medical management of non-ischemic cardiomyopathy.   Echocardiogram: 06/2023 IMPRESSIONS     1. Left ventricular ejection fraction, by estimation, is 40 to 45%. Left  ventricular ejection fraction by 2D MOD biplane is 42.1 %. The left  ventricle has mildly decreased function. The left ventricle has no  regional wall motion abnormalities. There is  moderate left ventricular hypertrophy. Left ventricular diastolic  parameters are consistent with Grade I diastolic dysfunction (impaired  relaxation).   2. Right ventricular systolic function is normal. The right ventricular  size is normal.   3. The mitral valve is grossly normal. No evidence of mitral valve  regurgitation.   4. The aortic valve is tricuspid. Aortic valve regurgitation is not  visualized.   5. Rhythm strip during this exam demonstrates normal sinus rhythm and  premature ventricular contractions.   Comparison(s): Changes from prior  study are noted. 03/18/2023: LVEF 30-35%.    Event Monitor: 06/2023 6 days and 16 hours (2024-07-15T14:34:40-0400 to 2024-07-22T06:51:56-0400)  Patient had a min HR of 48 bpm, max HR of 178 bpm, and avg HR of 70 bpm. Predominant underlying rhythm was Sinus Rhythm. 166 Ventricular Tachycardia runs occurred, the run with the fastest interval lasting 4 beats with a max rate of 160 bpm, the longest lasting 20 beats with an avg rate of 120 bpm. 1 run of  Supraventricular Tachycardia occurred lasting 14 beats with a max rate of 125 bpm (avg 117 bpm). Isolated SVEs were rare (<1.0%), SVE Couplets were rare (<1.0%), and SVE Triplets were rare (<1.0%). Isolated VEs were frequent (8.2%, 48731), VE Couplets were occasional (1.5%, 4358), and VE Triplets were rare (<1.0%, 323). Ventricular Bigeminy and Trigeminy were present.   Risk Assessment/Calculations:   {Does this patient have ATRIAL FIBRILLATION?:[phone]} No BP recorded.  {Refresh Note OR Click here to enter BP  :1}***         Physical Exam:   VS:  There were no vitals taken for this visit.   Wt Readings from Last 3 Encounters:  09/17/23 220 lb (99.8 kg)  09/04/23 230 lb (104.3 kg)  08/30/23 230 lb 3.2 oz (104.4 kg)     GEN: Well nourished, well developed in no acute distress NECK: No JVD; No carotid bruits CARDIAC: ***RRR, no murmurs, rubs, gallops RESPIRATORY:  Clear to auscultation without rales, wheezing or rhonchi  ABDOMEN: Appears non-distended. No obvious abdominal masses. EXTREMITIES: No clubbing or cyanosis. No edema.  Distal pedal pulses are 2+ bilaterally.   Assessment and Plan:   1. Chronic HFrEF - His EF was at 30-35% in 12/2022 with cath showing no evidence of CAD and recent echocardiogram in 06/2023 showed this had improved to 40-45%.  - *** - Continue current medical therapy with Farxiga 10 mg daily, Lasix 40 mg as needed, Toprol-XL 50 mg daily, Entresto 49-51 mg twice daily and Spironolactone 25 mg daily.  2. PVC's - Recent cardiac monitor showed an 8.2% PVC burden and he did have episodes of VT with the longest lasting for 20 beats.  3. History of Thyroid Cancer - He underwent prior thyroidectomy and remains on Synthroid 200 mcg daily.    Signed, Ellsworth Lennox, PA-C

## 2023-09-23 ENCOUNTER — Encounter: Payer: Self-pay | Admitting: Student

## 2023-09-23 ENCOUNTER — Other Ambulatory Visit (HOSPITAL_COMMUNITY): Payer: Self-pay

## 2023-09-24 ENCOUNTER — Other Ambulatory Visit (HOSPITAL_COMMUNITY): Payer: Self-pay

## 2023-09-25 ENCOUNTER — Other Ambulatory Visit (HOSPITAL_COMMUNITY): Payer: Self-pay

## 2023-09-26 ENCOUNTER — Encounter (HOSPITAL_COMMUNITY): Payer: Self-pay | Admitting: Gastroenterology

## 2023-10-02 ENCOUNTER — Other Ambulatory Visit: Payer: Self-pay

## 2023-10-02 ENCOUNTER — Other Ambulatory Visit (HOSPITAL_COMMUNITY): Payer: Self-pay

## 2023-10-09 ENCOUNTER — Other Ambulatory Visit (HOSPITAL_COMMUNITY): Payer: Self-pay

## 2023-10-16 ENCOUNTER — Other Ambulatory Visit: Payer: Self-pay

## 2023-10-29 ENCOUNTER — Encounter (HOSPITAL_COMMUNITY): Payer: Self-pay

## 2023-10-29 ENCOUNTER — Other Ambulatory Visit (HOSPITAL_COMMUNITY): Payer: Self-pay

## 2023-10-29 ENCOUNTER — Other Ambulatory Visit: Payer: Self-pay

## 2023-11-13 ENCOUNTER — Ambulatory Visit: Payer: No Typology Code available for payment source | Attending: Student | Admitting: Nurse Practitioner

## 2023-11-13 ENCOUNTER — Encounter: Payer: Self-pay | Admitting: Nurse Practitioner

## 2023-11-13 VITALS — BP 130/80 | HR 70 | Ht 71.0 in | Wt 234.8 lb

## 2023-11-13 DIAGNOSIS — I5022 Chronic systolic (congestive) heart failure: Secondary | ICD-10-CM | POA: Diagnosis not present

## 2023-11-13 DIAGNOSIS — I493 Ventricular premature depolarization: Secondary | ICD-10-CM

## 2023-11-13 DIAGNOSIS — I428 Other cardiomyopathies: Secondary | ICD-10-CM | POA: Diagnosis not present

## 2023-11-13 NOTE — Progress Notes (Signed)
Cardiology Office Note:  .   Date:  11/13/2023 ID:  Elijah Box., DOB [birth year], MRN [id number] PCP: Erick Colace, MD  [facility] Providers Cardiologist:  Dina Rich, MD    History of Present Illness: .   Elijah Box. is a 57 y.o. male with a PMH of HFrEF/NICM, history of thyroid cancer, s/p thyroidectomy in 2017, history of COVID-19 in 2023, very mild OSA (CPAP was not advised), frequent PVCs, GERD, who presents today for scheduled follow-up.  Closely followed by heart failure clinic.  Last seen by Randall An, PA-C on May 16, 2023.  Overall he was doing well at the time.  Did note worsening dizziness with positional changes after Entresto and spironolactone were last titrated at previous office visit, did say that this did improve.  Also reported decrease in libido, felt that this was due to his beta blocker.  Metoprolol succinate was decreased to 12.5 mg daily.    Seen by Dr. Gasper Lloyd in September 2024.  Overall was doing well.  Patient noted some rectal bleeding and was planning on a colonoscopy, was being followed by GI.  Today he presents for scheduled follow-up. He states he is doing well.  Says his weight is up due to working out in the gym.  Denies any acute cardiac complaints or issues. Denies any chest pain, shortness of breath, palpitations, syncope, presyncope, dizziness, orthopnea, PND, swelling or significant weight changes, acute bleeding, or claudication.  ROS: Negative.  See HPI.  Studies Reviewed: .    Cardiac monitor 07/2023: 6 days and 16 hours (2024-07-15T14:34:40-0400 to 2024-07-22T06:51:56-0400)  Patient had a min HR of 48 bpm, max HR of 178 bpm, and avg HR of 70 bpm. Predominant underlying rhythm was Sinus Rhythm. 166 Ventricular Tachycardia runs occurred, the run with the fastest interval lasting 4 beats with a max rate of 160 bpm, the longest lasting 20 beats with an avg rate of 120 bpm. 1 run of Supraventricular Tachycardia  occurred lasting 14 beats with a max rate of 125 bpm (avg 117 bpm). Isolated SVEs were rare (<1.0%), SVE Couplets were rare (<1.0%), and SVE Triplets were rare (<1.0%). Isolated VEs were frequent (8.2%, 48731), VE Couplets were occasional (1.5%, 4358), and VE Triplets were rare (<1.0%, 323). Ventricular Bigeminy and Trigeminy were present.   Echocardiogram 06/2023: 1. Left ventricular ejection fraction, by estimation, is 40 to 45%. Left  ventricular ejection fraction by 2D MOD biplane is 42.1 %. The left  ventricle has mildly decreased function. The left ventricle has no  regional wall motion abnormalities. There is  moderate left ventricular hypertrophy. Left ventricular diastolic  parameters are consistent with Grade I diastolic dysfunction (impaired  relaxation).   2. Right ventricular systolic function is normal. The right ventricular  size is normal.   3. The mitral valve is grossly normal. No evidence of mitral valve  regurgitation.   4. The aortic valve is tricuspid. Aortic valve regurgitation is not  visualized.   5. Rhythm strip during this exam demonstrates normal sinus rhythm and  premature ventricular contractions.   Comparison(s): Changes from prior study are noted. 03/18/2023: LVEF 30-35%.   Cardiac MRI 04/2023: IMPRESSION: 1.  Mild LV dilatation with moderate systolic dysfunction (EF 35%)   2.  Normal RV size and systolic dysfunction (EF 50%)   3. RV insertion site LGE, which is a nonspecific scar pattern often seen in setting of elevated pulmonary pressures  Right/left heart cath 12/2022: No angiographic evidence of CAD Normal  right and left heart pressures (RA 1, RV 22/0/2, PA 23/5 mean 12, PCWP 7, LV 102/3/9, AO 108/71, CO 4.97 L/min, CI 2.22)   Recommendations: Medical management of non-ischemic cardiomyopathy.   Physical Exam:   VS:  BP 130/80   Pulse 70   Ht 5\' 11"  (1.803 m)   Wt 234 lb 12.8 oz (106.5 kg)   SpO2 98%   BMI 32.75 kg/m    Wt Readings from Last  3 Encounters:  11/13/23 234 lb 12.8 oz (106.5 kg)  09/17/23 220 lb (99.8 kg)  09/04/23 230 lb (104.3 kg)    GEN: Obese, 57 year old male in no acute distress NECK: No JVD; No carotid bruits CARDIAC: S1/S2, RRR, no murmurs, rubs, gallops RESPIRATORY:  Clear to auscultation without rales, wheezing or rhonchi  ABDOMEN: Soft, non-tender, non-distended EXTREMITIES:  No edema; No deformity   ASSESSMENT AND PLAN: .    HFmrEF, NICM Stage C, NYHA class I symptoms.  EF 40 to 45% 06/2023, was previously 30 to 35% in April 2024.  Weight is up due to increase in muscle mass from gym exercises.  Euvolemic and well compensated on exam.  Continue Farxiga, Toprol-XL, Entresto, spironolactone, and Lasix as needed along with potassium supplement as needed. Low sodium diet, fluid restriction <2L, and daily weights encouraged. Educated to contact our office for weight gain of 2 lbs overnight or 5 lbs in one week.  Hx of frequent PVC's Denies any tachycardia or palpitations.  Continue metoprolol succinate. Heart healthy diet and regular cardiovascular exercise encouraged.  Continue to follow with PCP.   Dispo: Follow-up with Dr. Dina Rich or APP in 6 months or sooner if anything changes.  Signed, Sharlene Dory, NP

## 2023-11-13 NOTE — Patient Instructions (Addendum)

## 2023-11-25 ENCOUNTER — Other Ambulatory Visit (HOSPITAL_COMMUNITY): Payer: Self-pay

## 2023-12-09 ENCOUNTER — Other Ambulatory Visit: Payer: Self-pay

## 2023-12-09 ENCOUNTER — Other Ambulatory Visit (HOSPITAL_COMMUNITY): Payer: Self-pay

## 2023-12-13 ENCOUNTER — Other Ambulatory Visit (HOSPITAL_COMMUNITY): Payer: Self-pay

## 2023-12-17 ENCOUNTER — Other Ambulatory Visit: Payer: Self-pay

## 2023-12-25 ENCOUNTER — Encounter (HOSPITAL_COMMUNITY): Payer: Self-pay

## 2023-12-25 ENCOUNTER — Emergency Department (HOSPITAL_COMMUNITY): Payer: No Typology Code available for payment source

## 2023-12-25 ENCOUNTER — Emergency Department (HOSPITAL_COMMUNITY)
Admission: EM | Admit: 2023-12-25 | Discharge: 2023-12-25 | Disposition: A | Discharge status: Home / Self Care | Payer: No Typology Code available for payment source | Attending: Emergency Medicine | Admitting: Emergency Medicine

## 2023-12-25 ENCOUNTER — Other Ambulatory Visit: Payer: Self-pay

## 2023-12-25 DIAGNOSIS — M542 Cervicalgia: Secondary | ICD-10-CM | POA: Insufficient documentation

## 2023-12-25 HISTORY — DX: Heart failure, unspecified: I50.9

## 2023-12-25 LAB — TROPONIN I (HIGH SENSITIVITY): Troponin I (High Sensitivity): 8 ng/L (ref <18)

## 2023-12-25 MED ORDER — ACETAMINOPHEN 500 MG PO TABS
1000.0000 mg | ORAL_TABLET | Freq: Once | ORAL | Status: AC
  Administered 2023-12-25: 1000 mg via ORAL
  Filled 2023-12-25: qty 2

## 2023-12-25 MED ORDER — KETOROLAC TROMETHAMINE 30 MG/ML IJ SOLN
30.0000 mg | Freq: Once | INTRAMUSCULAR | Status: AC
  Administered 2023-12-25: 30 mg via INTRAVENOUS
  Filled 2023-12-25: qty 1

## 2023-12-25 MED ORDER — CYCLOBENZAPRINE HCL 10 MG PO TABS: 10.0000 mg | ORAL_TABLET | Freq: Two times a day (BID) | ORAL | 0 refills | Status: DC | PRN

## 2023-12-25 NOTE — ED Provider Notes (Signed)
 [place] EMERGENCY DEPARTMENT AT [facility] Provider Note   CSN: [id number] Arrival date & time: 12/25/23  1706     History  Chief Complaint  Patient presents with   Neck Injury    Elijah Sabedra. is a 58 y.o. male.  58 year old male presents emergency department with left-sided neck pain.  Patient says that he turned yesterday, and felt something pull in his neck.  Since then he said pain with turning his head.  He has some radiation in the pain to his shoulder.  He has not had any difficulty with his vision or headache.   Neck Injury       Home Medications Prior to Admission medications   Medication Sig Start Date End Date Taking? Authorizing Provider  cyclobenzaprine  (FLEXERIL ) 10 MG tablet Take 1 tablet (10 mg total) by mouth 2 (two) times daily as needed for muscle spasms. 12/25/23  Yes Mannie Pac T, DO  allopurinol  (ZYLOPRIM ) 100 MG tablet Take 100 mg by mouth daily. 11/26/19   [provider]  dapagliflozin  propanediol (FARXIGA ) 10 MG TABS tablet Take 1 tablet (10 mg total) by mouth daily. 02/04/23   Sabharwal, Aditya, DO  furosemide  (LASIX ) 40 MG tablet Take 1 tablet (40 mg total) by mouth daily as needed (weight gain (3lbs in 1 day or 5lbs in 1 week) or worsening swelling/edema). 01/04/23 01/04/24  Goodrich, Callie E, PA-C  levothyroxine  (SYNTHROID ) 200 MCG tablet Take 200 mcg by mouth daily before breakfast.    [provider]  metoprolol  succinate (TOPROL  XL) 50 MG 24 hr tablet Take 1 tablet (50 mg total) by mouth at bedtime. 09/04/23   Sabharwal, Aditya, DO  omeprazole (PRILOSEC) 20 MG capsule Take 20 mg by mouth daily.    [provider]  polyethylene glycol powder (GLYCOLAX /MIRALAX ) 17 GM/SCOOP powder Take 1 Container by mouth daily as needed.    [provider]  potassium chloride  SA (KLOR-CON  M) 20 MEQ tablet Take 1 tablet (20 mEq total) by mouth daily as needed (when you need to take the Lasix ). 01/04/23    Goodrich, Callie E, PA-C  sacubitril -valsartan  (ENTRESTO ) 49-51 MG Take 1 tablet by mouth 2 (two) times daily. 04/15/23   Sabharwal, Aditya, DO  sildenafil  (VIAGRA ) 100 MG tablet Take 0.5 tablets (50 mg total) by mouth daily as needed for erectile dysfunction. 07/01/23   Sabharwal, Aditya, DO  spironolactone  (ALDACTONE ) 25 MG tablet Take 1 tablet (25 mg total) by mouth daily. 04/15/23   Sabharwal, Ria, DO      Allergies    Other and Shellfish allergy    Review of Systems   Review of Systems  Physical Exam Updated Vital Signs BP (!) 135/91   Pulse 78   Temp 98.8 F (37.1 C) (Oral)   Resp 18   Ht 5' 11 (1.803 m)   Wt 104.3 kg   SpO2 100%   BMI 32.08 kg/m  Physical Exam Vitals reviewed.  HENT:     Head: Normocephalic.  Neck:     Comments: Pain in the left sternocleidomastoid.  No carotid bruit. Cardiovascular:     Rate and Rhythm: Normal rate.     Pulses: Normal pulses.  Musculoskeletal:        General: Tenderness present. No swelling or deformity.  Skin:    General: Skin is warm.  Neurological:     General: No focal deficit present.     Mental Status: He is alert.     Cranial Nerves: No cranial nerve  deficit.     Motor: No weakness.     Gait: Gait normal.     ED Results / Procedures / Treatments   Labs (all labs ordered are listed, but only abnormal results are displayed) Labs Reviewed  TROPONIN I (HIGH SENSITIVITY)    EKG None  Radiology CT Cervical Spine Wo Contrast Result Date: 12/25/2023 CLINICAL DATA:  Neck pain radiating to left shoulder.  Nausea. EXAM: CT CERVICAL SPINE WITHOUT CONTRAST TECHNIQUE: Multidetector CT imaging of the cervical spine was performed without intravenous contrast. Multiplanar CT image reconstructions were also generated. RADIATION DOSE REDUCTION: This exam was performed according to the departmental dose-optimization program which includes automated exposure control, adjustment of the mA and/or kV according to patient size and/or  use of iterative reconstruction technique. COMPARISON:  None Available. FINDINGS: Alignment: No evidence of traumatic malalignment. Skull base and vertebrae: No acute fracture. No primary bone lesion or focal pathologic process. Soft tissues and spinal canal: No prevertebral fluid or swelling. No visible canal hematoma. Disc levels: Prominent anterior osteophyte at C5-C6. Intervertebral disc space height is maintained. No significant spinal canal or neural foraminal narrowing. Upper chest: No acute abnormality. Other: Carotid calcification. IMPRESSION: No acute fracture or traumatic listhesis. Electronically Signed   By: Norman Gatlin M.D.   On: 12/25/2023 21:39    Procedures Procedures    Medications Ordered in ED Medications  ketorolac  (TORADOL ) 30 MG/ML injection 30 mg (30 mg Intravenous Given 12/25/23 2226)  acetaminophen  (TYLENOL ) tablet 1,000 mg (1,000 mg Oral Given 12/25/23 2226)    ED Course/ Medical Decision Making/ A&P                                 Medical Decision Making 58 year old male here today with neck pain.  Differential diagnoses include musculoskeletal neck pain, less likely dissection, less likely radiculopathy, less likely ACS.  Plan # patient's exam is consistent with musculoskeletal neck pain.  He has no signs or symptoms of dissection although it was considered.  Patient without any neurological deficits.  Patient with reproducible pain in the sternocleidomastoid, worse with motion.  Did speak with the patient's son who is an ED physician requested an EKG and a troponin on the patient.  My dependent review the patient's EKG shows no ST segment depression or elevation, no evidence of acute ischemia.  Patient's troponin 8.  Will discharge patient and have him follow-up with his PCP.  Amount and/or Complexity of Data Reviewed Radiology: ordered.  Risk OTC drugs. Prescription drug management.           Final Clinical Impression(s) / ED Diagnoses Final  diagnoses:  Neck pain    Rx / DC Orders ED Discharge Orders          Ordered    cyclobenzaprine  (FLEXERIL ) 10 MG tablet  2 times daily PRN        12/25/23 2324              Mannie Pac T, DO 12/25/23 2324

## 2023-12-25 NOTE — ED Triage Notes (Signed)
 Pt states neck is hurting radiating to his left shoulder that started yesterday, pt states he as all experienced some nausea. Denies headache, dizziness, blurred vision. Pt states he turned his head to the left yesterday and it started hurting afterwards.

## 2023-12-25 NOTE — ED Notes (Signed)
Ambulatory to tx from lobby

## 2023-12-25 NOTE — Discharge Instructions (Signed)
 While you are in the emergency room, you had a CT scan done of your neck that was normal.  Your EKG was normal, your troponin test was normal.  You can take 1000 mg of Tylenol  every 8 hours, 400 mg of ibuprofen every 6 hours.  I have sent your prescription for medication called Flexeril  that he can take up to 2 times today.  Do not drive while you are taking this medication.  Follow-up with your primary care doctor.  Return to the emergency room if you develop any difficulty with your vision, worsening headache, confusion.

## 2023-12-29 ENCOUNTER — Other Ambulatory Visit (HOSPITAL_COMMUNITY): Payer: Self-pay | Admitting: Cardiology

## 2023-12-31 ENCOUNTER — Other Ambulatory Visit (HOSPITAL_COMMUNITY): Payer: Self-pay

## 2023-12-31 MED ORDER — SPIRONOLACTONE 25 MG PO TABS
25.0000 mg | ORAL_TABLET | Freq: Every day | ORAL | 1 refills | Status: DC
  Filled 2023-12-31: qty 30, 30d supply, fill #0
  Filled 2024-01-26: qty 30, 30d supply, fill #1

## 2024-01-01 DIAGNOSIS — Z79899 Other long term (current) drug therapy: Secondary | ICD-10-CM

## 2024-01-01 DIAGNOSIS — I5022 Chronic systolic (congestive) heart failure: Secondary | ICD-10-CM

## 2024-01-02 ENCOUNTER — Other Ambulatory Visit: Payer: Self-pay

## 2024-01-02 ENCOUNTER — Other Ambulatory Visit (HOSPITAL_COMMUNITY): Payer: Self-pay

## 2024-01-02 DIAGNOSIS — Z79899 Other long term (current) drug therapy: Secondary | ICD-10-CM

## 2024-01-02 DIAGNOSIS — I5022 Chronic systolic (congestive) heart failure: Secondary | ICD-10-CM

## 2024-01-02 MED ORDER — FUROSEMIDE 40 MG PO TABS
ORAL_TABLET | ORAL | 6 refills | Status: DC
  Filled 2024-01-02: qty 33, 30d supply, fill #0
  Filled 2024-01-31: qty 30, 30d supply, fill #1

## 2024-01-13 ENCOUNTER — Other Ambulatory Visit (HOSPITAL_COMMUNITY): Payer: Self-pay

## 2024-01-13 ENCOUNTER — Other Ambulatory Visit (HOSPITAL_COMMUNITY): Payer: Self-pay | Admitting: Cardiology

## 2024-01-13 MED ORDER — ENTRESTO 49-51 MG PO TABS
1.0000 | ORAL_TABLET | Freq: Two times a day (BID) | ORAL | 6 refills | Status: DC
  Filled 2024-01-13: qty 60, 30d supply, fill #0
  Filled 2024-01-28: qty 30, 15d supply, fill #1

## 2024-01-14 ENCOUNTER — Other Ambulatory Visit (HOSPITAL_COMMUNITY)
Admission: RE | Admit: 2024-01-14 | Discharge: 2024-01-14 | Disposition: A | Discharge status: Home / Self Care | Payer: No Typology Code available for payment source | Source: Ambulatory Visit | Attending: Nurse Practitioner | Admitting: Nurse Practitioner

## 2024-01-14 DIAGNOSIS — Z79899 Other long term (current) drug therapy: Secondary | ICD-10-CM | POA: Insufficient documentation

## 2024-01-14 DIAGNOSIS — I5022 Chronic systolic (congestive) heart failure: Secondary | ICD-10-CM | POA: Diagnosis present

## 2024-01-14 LAB — BASIC METABOLIC PANEL
Anion gap: 9 (ref 5–15)
BUN: 16 mg/dL (ref 6–20)
CO2: 26 mmol/L (ref 22–32)
Calcium: 9.4 mg/dL (ref 8.9–10.3)
Chloride: 99 mmol/L (ref 98–111)
Creatinine, Ser: 1.27 mg/dL — ABNORMAL HIGH (ref 0.61–1.24)
GFR, Estimated: >60 mL/min (ref >60)
Glucose, Bld: 119 mg/dL — ABNORMAL HIGH (ref 70–99)
Potassium: 3.9 mmol/L (ref 3.5–5.1)
Sodium: 134 mmol/L — ABNORMAL LOW (ref 135–145)

## 2024-01-14 LAB — MAGNESIUM: Magnesium: 2 mg/dL (ref 1.7–2.4)

## 2024-01-14 LAB — BRAIN NATRIURETIC PEPTIDE: B Natriuretic Peptide: 21 pg/mL (ref 0.0–100.0)

## 2024-01-23 ENCOUNTER — Telehealth (HOSPITAL_COMMUNITY): Payer: Self-pay | Admitting: Cardiology

## 2024-01-23 NOTE — Telephone Encounter (Signed)
 Pt aware and voided understanding

## 2024-01-23 NOTE — Telephone Encounter (Signed)
 Patient called to report fluid increase   Normal weight 225 Weight today 230 -denies swelling CP -reports cough -reports SOb with exertion  Lasix  is 40 mg daily  Reports compliance with fluid and sodium restrictions   Please advise

## 2024-01-26 ENCOUNTER — Other Ambulatory Visit: Payer: Self-pay

## 2024-01-28 ENCOUNTER — Other Ambulatory Visit (HOSPITAL_COMMUNITY): Payer: Self-pay

## 2024-01-31 ENCOUNTER — Other Ambulatory Visit (HOSPITAL_COMMUNITY): Payer: Self-pay

## 2024-02-18 ENCOUNTER — Encounter (HOSPITAL_COMMUNITY): Payer: Self-pay | Admitting: Cardiology

## 2024-02-18 MED ORDER — DAPAGLIFLOZIN PROPANEDIOL 10 MG PO TABS: 10.0000 mg | ORAL_TABLET | Freq: Every day | ORAL | 11 refills | Status: DC

## 2024-02-18 MED ORDER — SPIRONOLACTONE 25 MG PO TABS: 25.0000 mg | ORAL_TABLET | Freq: Every day | ORAL | 11 refills | Status: DC

## 2024-02-18 MED ORDER — FUROSEMIDE 40 MG PO TABS: 40.0000 mg | ORAL_TABLET | Freq: Every day | ORAL | 11 refills | Status: DC

## 2024-02-18 NOTE — Telephone Encounter (Signed)
 Elijah Perez with [facility]  called to report [facility] not [facility]iga at [facility].  Provider can send to out pharmacy or change to jardiance    [phone]   Please advise

## 2024-03-03 NOTE — Progress Notes (Signed)
 ADVANCED HEART FAILURE CLINIC NOTE  Referring Physician: Erick Colace, MD  Primary Care: Elijah Colace, MD Primary Cardiologist: Dr. Isabel Perez  HPI: Elijah Perez. is a 58 y.o. male with heart failure with reduced ejection fraction secondary to nonischemic cardiomyopathy, thyroid cancer status post thyroidectomy in 2017, history of COVID-19 in 2023, GERD presenting today to establish care.  Elijah Perez cardiac history dates back to January 01, 2023 when he was admitted to [facility] with acute systolic heart failure.  He had a left heart cath that was unremarkable and right heart cath that demonstrated preserved cardiac output.  He was started on low-dose GDMT and since that time is followed up in [facility] clinic where GDMT was further uptitrated. He ran a 5K in November and although he completed it he felt much more fatigued than usual. Prior to this he could run a 25m mile. No supplements for exercise other than protein.   Interval history - Back to lifting weights; doing very well from a functional standpoint. Only complaints today are slicing the golf ball. Short game is great though.   Activity level/exercise tolerance: NYHA II Orthopnea:  Sleeps flat Paroxysmal noctural dyspnea:  No Chest pain/pressure:  No Orthostatic lightheadedness:  No Palpitations: improved significantly.  Lower extremity edema:  No Presyncope/syncope:  No Cough:  No  Current Outpatient Medications  Medication Sig Dispense Refill   allopurinol (ZYLOPRIM) 100 MG tablet Take 100 mg by mouth daily.     cyclobenzaprine (FLEXERIL) 10 MG tablet Take 1 tablet (10 mg total) by mouth 2 (two) times daily as needed for muscle spasms. 20 tablet 0   dapagliflozin propanediol (FARXIGA) 10 MG TABS tablet Take 1 tablet (10 mg total) by mouth daily. 30 tablet 11   furosemide (LASIX) 40 MG tablet Take 1 tablet (40 mg total) by mouth daily for 3 days. 30 tablet 11   levothyroxine (SYNTHROID) 200 MCG tablet Take 200  mcg by mouth daily before breakfast.     metoprolol succinate (TOPROL XL) 50 MG 24 hr tablet Take 1 tablet (50 mg total) by mouth at bedtime. 30 tablet 5   omeprazole (PRILOSEC) 20 MG capsule Take 20 mg by mouth daily.     polyethylene glycol powder (GLYCOLAX/MIRALAX) 17 GM/SCOOP powder Take 1 Container by mouth daily as needed.     potassium chloride SA (KLOR-CON M) 20 MEQ tablet Take 1 tablet (20 mEq total) by mouth daily as needed (when you need to take the Lasix). 30 tablet 1   sacubitril-valsartan (ENTRESTO) 49-51 MG Take 1 tablet by mouth 2 (two) times daily. 60 tablet 6   sildenafil (VIAGRA) 100 MG tablet Take 0.5 tablets (50 mg total) by mouth daily as needed for erectile dysfunction. 10 tablet 0   spironolactone (ALDACTONE) 25 MG tablet Take 1 tablet (25 mg total) by mouth daily. 30 tablet 11   No current facility-administered medications for this visit.   PHYSICAL EXAM: There were no vitals filed for this visit. GENERAL: NAD Lungs- *** CARDIAC:  JVP: *** cm          Normal rate with regular rhythm. *** murmur.  Pulses ***. *** edema.  ABDOMEN: Soft, non-tender, non-distended.  EXTREMITIES: Warm and well perfused.  NEUROLOGIC: No obvious FND   DATA REVIEW  ECG: 01/11/23: NSR with PVC.  07/01/23: NSR w/ frequent PVCs.   ECHO: 01/01/23: LVEF 30 to 35% with global hypokinesis.  Normal RV function.  Severely dilated left atrium. 03/18/2023: LVEF 35% with  global hypokinesis 07/01/23: LVEF 40-45%  CATH: 01/03/23: No angiographic evidence of CAD Normal right and left heart pressures (RA 1, RV 22/0/2, PA 23/5 mean 12, PCWP 7, LV 102/3/9, AO 108/71, CO 4.97 L/min, CI 2.22)  CMR: 04/30/23:  1.  Mild LV dilatation with moderate systolic dysfunction (EF 35%)  2.  Normal RV size and systolic dysfunction (EF 50%)  3. RV insertion site LGE, which is a nonspecific scar pattern often seen in setting of elevated pulmonary pressures   ASSESSMENT & PLAN:  Heart failure with reduced  ejection fraction Etiology of HF: Nonischemic cardiomyopathy, left heart cath as demonstrated above with no evidence of CAD. Reports frequent palpitations prior to dx of HFrEF, enlarged LA on TTE. Ziopatch without r tachyarrhythmias. Otherwise, no significant FH. Has lifted heavy weights for years; however, no reported use of anabolic steroids or other products aside from protein/creatine.  NYHA class / AHA Stage:NYHA II Volume status & Diuretics: euvolemic; has not required lasix for several weeks now.  Vasodilators: Entresto 49/51mg  BID Beta-Blocker: increase metoprolol 50mg   MRA: spironolactone 12.5mg  Cardiometabolic:continue jardiance Devices therapies & Valvulopathies:not indicated Advanced therapies:not indicated.   2. Thyroid Cancer S/P thyroidectomy  - Levothyroxine daily.  - S/P radiation and radioactive iodone.  - No chemotherapy.   3. Sleep Apnea - reports that it was very mild and CPAP was not advised.   4. Frequent PVC - ziopatch previously with PVC burden of 6% - Repeat ziopatch with close to 10% PVC burden  5. Decreased libido - Will need to uptitrate metoprolol dose - start sildenafil prn.   6. Rectal bleeding - Planning on colonoscopy; followed by GI.     Elijah Perez Advanced Heart Failure Mechanical Circulatory Support

## 2024-03-04 ENCOUNTER — Encounter (HOSPITAL_COMMUNITY): Payer: Self-pay | Admitting: Cardiology

## 2024-03-04 ENCOUNTER — Ambulatory Visit (HOSPITAL_COMMUNITY)
Admission: RE | Admit: 2024-03-04 | Discharge: 2024-03-04 | Disposition: A | Discharge status: Home / Self Care | Payer: No Typology Code available for payment source | Source: Ambulatory Visit | Attending: Cardiology | Admitting: Cardiology

## 2024-03-04 ENCOUNTER — Ambulatory Visit (HOSPITAL_BASED_OUTPATIENT_CLINIC_OR_DEPARTMENT_OTHER)
Admission: RE | Admit: 2024-03-04 | Discharge: 2024-03-04 | Disposition: A | Discharge status: Home / Self Care | Payer: No Typology Code available for payment source | Source: Ambulatory Visit | Attending: Cardiology | Admitting: Cardiology

## 2024-03-04 VITALS — BP 144/92 | HR 75 | Ht 71.0 in | Wt 235.2 lb

## 2024-03-04 DIAGNOSIS — R6882 Decreased libido: Secondary | ICD-10-CM | POA: Insufficient documentation

## 2024-03-04 DIAGNOSIS — I493 Ventricular premature depolarization: Secondary | ICD-10-CM | POA: Insufficient documentation

## 2024-03-04 DIAGNOSIS — K625 Hemorrhage of anus and rectum: Secondary | ICD-10-CM | POA: Insufficient documentation

## 2024-03-04 DIAGNOSIS — I5020 Unspecified systolic (congestive) heart failure: Secondary | ICD-10-CM

## 2024-03-04 DIAGNOSIS — I5022 Chronic systolic (congestive) heart failure: Secondary | ICD-10-CM | POA: Diagnosis not present

## 2024-03-04 DIAGNOSIS — E890 Postprocedural hypothyroidism: Secondary | ICD-10-CM | POA: Insufficient documentation

## 2024-03-04 DIAGNOSIS — G4730 Sleep apnea, unspecified: Secondary | ICD-10-CM | POA: Insufficient documentation

## 2024-03-04 DIAGNOSIS — Z923 Personal history of irradiation: Secondary | ICD-10-CM | POA: Insufficient documentation

## 2024-03-04 DIAGNOSIS — Z79899 Other long term (current) drug therapy: Secondary | ICD-10-CM | POA: Diagnosis not present

## 2024-03-04 DIAGNOSIS — Z79890 Hormone replacement therapy (postmenopausal): Secondary | ICD-10-CM | POA: Insufficient documentation

## 2024-03-04 DIAGNOSIS — I428 Other cardiomyopathies: Secondary | ICD-10-CM | POA: Diagnosis not present

## 2024-03-04 LAB — ECHOCARDIOGRAM COMPLETE
AR max vel: 4.22 cm2
AV Area VTI: 4.44 cm2
AV Area mean vel: 3.63 cm2
AV Mean grad: 1 mmHg
AV Peak grad: 2.2 mmHg
Ao pk vel: 0.74 m/s
Area-P 1/2: 3.07 cm2
Calc EF: 53 %
MV VTI: 4.79 cm2
S' Lateral: 4.1 cm
Single Plane A2C EF: 50.6 %
Single Plane A4C EF: 53.2 %

## 2024-03-04 LAB — BASIC METABOLIC PANEL
Anion gap: 9 (ref 5–15)
BUN: 18 mg/dL (ref 6–20)
CO2: 26 mmol/L (ref 22–32)
Calcium: 9.3 mg/dL (ref 8.9–10.3)
Chloride: 101 mmol/L (ref 98–111)
Creatinine, Ser: 1.09 mg/dL (ref 0.61–1.24)
GFR, Estimated: >60 mL/min (ref >60)
Glucose, Bld: 110 mg/dL — ABNORMAL HIGH (ref 70–99)
Potassium: 4.1 mmol/L (ref 3.5–5.1)
Sodium: 136 mmol/L (ref 135–145)

## 2024-03-04 LAB — BRAIN NATRIURETIC PEPTIDE: B Natriuretic Peptide: 13.1 pg/mL (ref 0.0–100.0)

## 2024-03-04 MED ORDER — DAPAGLIFLOZIN PROPANEDIOL 10 MG PO TABS: 10.0000 mg | ORAL_TABLET | Freq: Every day | ORAL | 11 refills | Status: DC

## 2024-03-04 MED ORDER — SPIRONOLACTONE 25 MG PO TABS: 25.0000 mg | ORAL_TABLET | Freq: Every day | ORAL | 11 refills | Status: AC

## 2024-03-04 MED ORDER — FUROSEMIDE 40 MG PO TABS: 40.0000 mg | ORAL_TABLET | Freq: Every day | ORAL | 11 refills | Status: DC

## 2024-03-04 MED ORDER — ENTRESTO 97-103 MG PO TABS: 1.0000 | ORAL_TABLET | Freq: Two times a day (BID) | ORAL | 3 refills | Status: DC

## 2024-03-04 MED ORDER — POTASSIUM CHLORIDE CRYS ER 20 MEQ PO TBCR: 20.0000 meq | EXTENDED_RELEASE_TABLET | Freq: Every day | ORAL | 1 refills | Status: DC | PRN

## 2024-03-04 NOTE — Patient Instructions (Signed)
 Medication Changes:  INCREASE ENTRESTO TO 97/103MG  TWICE DAILY   INCREASE SPIRONOLACTONE TO 25MG  ONCE DAILY   Lab Work:  Labs done today, your results will be available in MyChart, we will contact you for abnormal readings.  THEN AGAIN IN 1 WEEK AS SCHEDULED   Follow-Up in: 3 MONTHS AS SCHEDULED   At the [facility], you and your health needs are our priority. We have a designated team specialized in the treatment of Heart Failure. This Care Team includes your primary Heart Failure Specialized Cardiologist (physician), Advanced Practice Providers (APPs- Physician Assistants and Nurse Practitioners), and Pharmacist who all work together to provide you with the care you need, when you need it.   You may see any of the following providers on your designated Care Team at your next follow up:  Dr. Arvilla Meres Dr. Marca Ancona Dr. Dorthula Nettles Dr. Theresia Bough Tonye Becket, NP Robbie Lis, [place] [facility], [place] Brynda Peon, NP [place] Lee, NP Karle Plumber, PharmD   Please be sure to bring in all your medications bottles to every appointment.   Need to Contact us:  If you have any questions or concerns before your next appointment please send [place] a message through [place] or call our office at [phone].    TO LEAVE A MESSAGE FOR THE NURSE SELECT OPTION 2, PLEASE LEAVE A MESSAGE INCLUDING: YOUR NAME DATE OF BIRTH CALL BACK NUMBER REASON FOR CALL**this is important as we prioritize the call backs  YOU WILL RECEIVE A CALL BACK THE SAME DAY AS LONG AS YOU CALL BEFORE 4:00 PM

## 2024-03-04 NOTE — Progress Notes (Signed)
  2D Echocardiogram has been performed.  Ocie Doyne RDCS 03/04/2024, 2:43 PM

## 2024-03-05 ENCOUNTER — Telehealth (HOSPITAL_COMMUNITY): Payer: Self-pay | Admitting: Cardiology

## 2024-03-05 MED ORDER — EMPAGLIFLOZIN 10 MG PO TABS: 10.0000 mg | ORAL_TABLET | Freq: Every day | ORAL | 11 refills | Status: AC

## 2024-03-05 NOTE — Telephone Encounter (Signed)
 [facility] Request[facility] on lasix RX  (One tba daily x 3 days then as needed for fluid/SOB/swelling)  Requests ok RX change [facility] not [facility]xiga at all Dr Gasper Lloyd note says to continue jardiance Meds list updated and verbal given

## 2024-03-07 ENCOUNTER — Encounter (HOSPITAL_COMMUNITY): Payer: Self-pay | Admitting: Cardiology

## 2024-03-11 ENCOUNTER — Ambulatory Visit (HOSPITAL_COMMUNITY)
Admission: RE | Admit: 2024-03-11 | Discharge: 2024-03-11 | Disposition: A | Discharge status: Home / Self Care | Source: Ambulatory Visit | Attending: Cardiology | Admitting: Cardiology

## 2024-03-11 DIAGNOSIS — I5020 Unspecified systolic (congestive) heart failure: Secondary | ICD-10-CM | POA: Insufficient documentation

## 2024-03-11 LAB — BASIC METABOLIC PANEL
Anion gap: 6 (ref 5–15)
BUN: 14 mg/dL (ref 6–20)
CO2: 25 mmol/L (ref 22–32)
Calcium: 9.5 mg/dL (ref 8.9–10.3)
Chloride: 105 mmol/L (ref 98–111)
Creatinine, Ser: 1.12 mg/dL (ref 0.61–1.24)
GFR, Estimated: >60 mL/min (ref >60)
Glucose, Bld: 106 mg/dL — ABNORMAL HIGH (ref 70–99)
Potassium: 3.7 mmol/L (ref 3.5–5.1)
Sodium: 136 mmol/L (ref 135–145)

## 2024-03-11 LAB — BRAIN NATRIURETIC PEPTIDE: B Natriuretic Peptide: 16.6 pg/mL (ref 0.0–100.0)

## 2024-03-30 ENCOUNTER — Encounter (HOSPITAL_COMMUNITY): Payer: Self-pay | Admitting: Cardiology

## 2024-03-30 DIAGNOSIS — I5020 Unspecified systolic (congestive) heart failure: Secondary | ICD-10-CM

## 2024-03-30 MED ORDER — METOPROLOL SUCCINATE ER 50 MG PO TB24: 50.0000 mg | ORAL_TABLET | Freq: Every day | ORAL | 5 refills | Status: DC

## 2024-04-13 ENCOUNTER — Other Ambulatory Visit: Payer: Self-pay

## 2024-04-13 ENCOUNTER — Encounter (HOSPITAL_COMMUNITY): Payer: Self-pay | Admitting: Cardiology

## 2024-04-21 ENCOUNTER — Other Ambulatory Visit (HOSPITAL_COMMUNITY): Payer: Self-pay

## 2024-05-14 ENCOUNTER — Other Ambulatory Visit: Payer: Self-pay | Admitting: Cardiology

## 2024-05-14 ENCOUNTER — Encounter: Payer: Self-pay | Admitting: Cardiology

## 2024-05-14 ENCOUNTER — Ambulatory Visit: Attending: Cardiology

## 2024-05-14 ENCOUNTER — Ambulatory Visit: Payer: No Typology Code available for payment source | Attending: Cardiology | Admitting: Cardiology

## 2024-05-14 VITALS — BP 128/90 | HR 66 | Ht 71.0 in | Wt 221.2 lb

## 2024-05-14 DIAGNOSIS — R42 Dizziness and giddiness: Secondary | ICD-10-CM

## 2024-05-14 DIAGNOSIS — I5020 Unspecified systolic (congestive) heart failure: Secondary | ICD-10-CM

## 2024-05-14 DIAGNOSIS — I493 Ventricular premature depolarization: Secondary | ICD-10-CM

## 2024-05-14 NOTE — Progress Notes (Signed)
 Clinical Summary Mr. Schara is a 58 y.o.male seen today for follow up of the following medical problems.   1.Chronic HFrEF - new diagnosis during Jan 2024 admission, echo LVEF 30-35%  - Jan 2024 RHC/LHC: no CAD. RA 1, RV 22/0/2, PA 23/5 mean 12, PCWP 7, LV 102/3/9, AO 108/71, CO 4.97 L/min, CI 2.22   -04/2023 cMRI: LVEF 35%, no specific myocardial findings - 06/2023 echo: LVEF 40-45% - 02/2024 echo: LVEF 40-45%, grade I dd, normal RV function - no SOB/DOE. - can be lightheaded when standing. Drinking 60 ounces per day. Can have some associated palpitations. Started about 1 month  May 2025 labs K 4.5   2. OSA - mild sleep apena per patient report.   3. PVCs  ziopatch previously with PVC burden of 6% - Repeat ziopatch with close to 10% PVC burden  Past Medical History:  Diagnosis Date   Cancer (HCC)    thyroid   CHF (congestive heart failure) (HCC)    Gout    HFrEF (heart failure with reduced ejection fraction) (HCC)    a. EF 30-35% in 12/2022 with cath showing no evidence of CAD   Horner's syndrome    Thalassemia trait      Allergies  Allergen Reactions   Other Other (See Comments)    Pet dander, pollen -> eyes itchy/watery, sneezing   Shellfish Allergy Itching     Current Outpatient Medications  Medication Sig Dispense Refill   allopurinol  (ZYLOPRIM ) 100 MG tablet Take 100 mg by mouth daily.     cyclobenzaprine  (FLEXERIL ) 10 MG tablet Take 1 tablet (10 mg total) by mouth 2 (two) times daily as needed for muscle spasms. 20 tablet 0   empagliflozin  (JARDIANCE ) 10 MG TABS tablet Take 1 tablet (10 mg total) by mouth daily before breakfast. 30 tablet 11   furosemide  (LASIX ) 40 MG tablet Take 1 tablet (40 mg total) by mouth daily for 3 days. 30 tablet 11   levothyroxine  (SYNTHROID ) 200 MCG tablet Take 200 mcg by mouth daily before breakfast.     metoprolol  succinate (TOPROL  XL) 50 MG 24 hr tablet Take 1 tablet (50 mg total) by mouth at bedtime. 30 tablet 5    omeprazole (PRILOSEC) 20 MG capsule Take 20 mg by mouth daily.     polyethylene glycol powder (GLYCOLAX /MIRALAX ) 17 GM/SCOOP powder Take 1 Container by mouth daily as needed.     potassium chloride  SA (KLOR-CON  M) 20 MEQ tablet Take 1 tablet (20 mEq total) by mouth daily as needed (when you need to take the Lasix ). 30 tablet 1   sacubitril -valsartan  (ENTRESTO ) 97-103 MG Take 1 tablet by mouth 2 (two) times daily. 60 tablet 3   sildenafil  (VIAGRA ) 100 MG tablet Take 0.5 tablets (50 mg total) by mouth daily as needed for erectile dysfunction. 10 tablet 0   spironolactone  (ALDACTONE ) 25 MG tablet Take 1 tablet (25 mg total) by mouth daily. 30 tablet 11   No current facility-administered medications for this visit.     Past Surgical History:  Procedure Laterality Date   COLONOSCOPY WITH PROPOFOL  N/A 09/17/2023   Procedure: COLONOSCOPY WITH PROPOFOL ;  Surgeon: Eartha Angelia Sieving, MD;  Location: [facility];  Service: Gastroenterology;  Laterality: N/A;  11:30am, asa 3   EYE SURGERY     RIGHT/LEFT HEART CATH AND CORONARY ANGIOGRAPHY N/A 01/03/2023   Procedure: RIGHT/LEFT HEART CATH AND CORONARY ANGIOGRAPHY;  Surgeon: Verlin Lonni BIRCH, MD;  Location: [facility] INVASIVE CV [facility];  Service: Cardiovascular;  Laterality: N/A;   THYROIDECTOMY Bilateral      Allergies  Allergen Reactions   Other Other (See Comments)    Pet dander, pollen -> eyes itchy/watery, sneezing   Shellfish Allergy Itching      Family History  Problem Relation Age of Onset   Valvular heart disease Mother    Stroke Father    Heart murmur Brother    Atrial fibrillation Brother    Cancer - Colon Neg Hx      Social History Mr. Crum reports that he quit smoking about 29 years ago. His smoking use included cigarettes. He has never used smokeless tobacco. Mr. Arismendez reports that he does not currently use alcohol after a past usage of about 2.0 standard drinks of alcohol per week.      Physical  Examination Today's Vitals   05/14/24 1402  BP: (!) 128/90  Pulse: 66  SpO2: 94%  Weight: 221 lb 3.2 oz (100.3 kg)  Height: 5' 11 (1.803 m)   Body mass index is 30.85 kg/m.  Gen: resting comfortably, no acute distress HEENT: no scleral icterus, pupils equal round and reactive, no palptable cervical adenopathy,  CV: RRR, no m/rg, no jvd Resp: Clear to auscultation bilaterally GI: abdomen is soft, non-tender, non-distended, normal bowel sounds, no hepatosplenomegaly MSK: extremities are warm, no edema.  Skin: warm, no rash Neuro:  no focal deficits Psych: appropriate affect    Assessment and Plan  1.Chronic HFrEF - no symptoms, continue current meds  2. PVCs - plan for zio patch to further evaluate   F/u 4 months   Dorn PHEBE Ross, M.D.

## 2024-05-14 NOTE — Patient Instructions (Addendum)
 Medication Instructions:   Continue all current medications.   Labwork:  none  Testing/Procedures:  Your physician has recommended that you wear a 7 day event monitor. Event monitors are medical devices that record the heart's electrical activity. Doctors most often us  these monitors to diagnose arrhythmias. Arrhythmias are problems with the speed or rhythm of the heartbeat. The monitor is a small, portable device. You can wear one while you do your normal daily activities. This is usually used to diagnose what is causing palpitations/syncope (passing out). Office will contact with results via phone, letter or mychart.     Follow-Up:  4 months   Any Other Special Instructions Will Be Listed Below (If Applicable).   If you need a refill on your cardiac medications before your next appointment, please call your pharmacy.

## 2024-05-25 ENCOUNTER — Encounter (HOSPITAL_COMMUNITY): Payer: Self-pay | Admitting: Cardiology

## 2024-05-25 DIAGNOSIS — I5020 Unspecified systolic (congestive) heart failure: Secondary | ICD-10-CM

## 2024-05-27 MED ORDER — ENTRESTO 49-51 MG PO TABS: 1.0000 | ORAL_TABLET | Freq: Two times a day (BID) | ORAL | 6 refills | Status: DC

## 2024-05-29 ENCOUNTER — Telehealth (HOSPITAL_COMMUNITY): Payer: Self-pay | Admitting: Cardiology

## 2024-05-29 NOTE — Telephone Encounter (Signed)
 Called to confirm/remind patient of their appointment at the [facility] on 05/29/2024.   Appointment:   [x] Confirmed  [] Left mess   [] No answer/No voice mail  [] VM Full/unable to leave message  [] Phone not in service  Patient reminded to bring all medications and/or complete list.  Confirmed patient has transportation. Gave directions, instructed to utilize valet parking.

## 2024-05-31 NOTE — Progress Notes (Signed)
 ADVANCED HEART FAILURE CLINIC NOTE  Referring Physician: Jennie Moeller, MD  Primary Care: Jennie Moeller, MD Primary Cardiologist: Dr. Rainey Burden  CC: Chronic HFrEF  HPI: Elijah Perez. is a 58 y.o. male with heart failure with reduced ejection fraction secondary to nonischemic cardiomyopathy, thyroid cancer status post thyroidectomy in 2017, history of COVID-19 in 2023, GERD presenting today to establish care.  Mr. Kise cardiac history dates back to January 01, 2023 when he was admitted to [facility] with acute systolic heart failure.  He had a left heart cath that was unremarkable and right heart cath that demonstrated preserved cardiac output.  He was started on low-dose GDMT and since that time is followed up in [facility] clinic where GDMT was further uptitrated. He ran a 5K in November and although he completed it he felt much more fatigued than usual. Prior to this he could run a 1m mile. No supplements for exercise other than protein.   Interval history - Doing very well from a functional standpoint.  - Has required lasix  for the past several days due to persistent coughing.   Activity level/exercise tolerance: NYHA II Orthopnea:  Sleeps flat Paroxysmal noctural dyspnea:  No Chest pain/pressure:  No Orthostatic lightheadedness:  No Palpitations: improved significantly.  Lower extremity edema:  No Presyncope/syncope:  No Cough:  No  Current Outpatient Medications  Medication Sig Dispense Refill   allopurinol  (ZYLOPRIM ) 100 MG tablet Take 100 mg by mouth daily.     cyclobenzaprine  (FLEXERIL ) 10 MG tablet Take 1 tablet (10 mg total) by mouth 2 (two) times daily as needed for muscle spasms. 20 tablet 0   empagliflozin  (JARDIANCE ) 10 MG TABS tablet Take 1 tablet (10 mg total) by mouth daily before breakfast. 30 tablet 11   furosemide  (LASIX ) 40 MG tablet Take 1 tablet (40 mg total) by mouth daily for 3 days. (Patient taking differently: Take 40 mg by mouth daily as needed  for edema.) 30 tablet 11   ketotifen (ZADITOR) 0.035 % ophthalmic solution Place 2 drops into both eyes daily.     levothyroxine  (SYNTHROID ) 200 MCG tablet Take 200 mcg by mouth daily before breakfast.     metoprolol  succinate (TOPROL  XL) 50 MG 24 hr tablet Take 1 tablet (50 mg total) by mouth at bedtime. 30 tablet 5   omeprazole (PRILOSEC) 20 MG capsule Take 20 mg by mouth daily.     polyethylene glycol powder (GLYCOLAX /MIRALAX ) 17 GM/SCOOP powder Take 1 Container by mouth daily as needed.     potassium chloride  SA (KLOR-CON  M) 20 MEQ tablet Take 1 tablet (20 mEq total) by mouth daily as needed (when you need to take the Lasix ). 30 tablet 1   sacubitril -valsartan  (ENTRESTO ) 49-51 MG Take 1 tablet by mouth 2 (two) times daily. 60 tablet 6   sildenafil  (VIAGRA ) 100 MG tablet Take 0.5 tablets (50 mg total) by mouth daily as needed for erectile dysfunction. 10 tablet 0   spironolactone  (ALDACTONE ) 25 MG tablet Take 1 tablet (25 mg total) by mouth daily. 30 tablet 11   No current facility-administered medications for this visit.   PHYSICAL EXAM: There were no vitals filed for this visit. GENERAL: NAD Lungs- *** CARDIAC:  JVP: *** cm          Normal rate with regular rhythm. *** murmur.  Pulses ***. *** edema.  ABDOMEN: Soft, non-tender, non-distended.  EXTREMITIES: Warm and well perfused.   DATA REVIEW  ECG: 01/11/23: NSR with PVC.  07/01/23: NSR  w/ frequent PVCs.   ECHO: 01/01/23: LVEF 30 to 35% with global hypokinesis.  Normal RV function.  Severely dilated left atrium. 03/18/2023: LVEF 35% with global hypokinesis 07/01/23: LVEF 40-45%  CATH: 01/03/23: No angiographic evidence of CAD Normal right and left heart pressures (RA 1, RV 22/0/2, PA 23/5 mean 12, PCWP 7, LV 102/3/9, AO 108/71, CO 4.97 L/min, CI 2.22)  CMR: 04/30/23:  1.  Mild LV dilatation with moderate systolic dysfunction (EF 35%)  2.  Normal RV size and systolic dysfunction (EF 50%)  3. RV insertion site LGE, which is a  nonspecific scar pattern often seen in setting of elevated pulmonary pressures   ASSESSMENT & PLAN:  Heart failure with reduced ejection fraction Etiology of HF: Nonischemic cardiomyopathy, left heart cath as demonstrated above with no evidence of CAD. Reports frequent palpitations prior to dx of HFrEF, enlarged LA on TTE. Ziopatch without r tachyarrhythmias. Otherwise, no significant FH. Has lifted heavy weights for years; however, no reported use of anabolic steroids or other products aside from protein/creatine.  NYHA class / AHA Stage:NYHA II Volume status & Diuretics: euvolemic; taking 20mg  daily right now; will go back down to as needed.  Vasodilators: Increase Entresto  to 97/103mg  BID Beta-Blocker: increase metoprolol  50mg   MRA: spironolactone  25mg  daily Cardiometabolic:continue jardiance  Devices therapies & Valvulopathies:not indicated Advanced therapies:not indicated.   2. Thyroid Cancer S/P thyroidectomy  - Levothyroxine  200mcg daily.  - S/P radiation and radioactive iodone.  - No chemotherapy.   3. Sleep Apnea - reports that it was very mild and CPAP was not advised.   4. Frequent PVC - ziopatch previously with PVC burden of 6% - Repeat ziopatch with close to 10% PVC burden  5. Decreased libido - Will need to uptitrate metoprolol  dose - start sildenafil  prn.   6. Rectal bleeding - Planning on colonoscopy; followed by GI.    Kristoph Sattler Advanced Heart Failure Mechanical Circulatory Support

## 2024-06-01 ENCOUNTER — Ambulatory Visit (HOSPITAL_COMMUNITY)
Admission: RE | Admit: 2024-06-01 | Discharge: 2024-06-01 | Disposition: A | Discharge status: Home / Self Care | Source: Ambulatory Visit | Attending: Cardiology | Admitting: Cardiology

## 2024-06-01 ENCOUNTER — Encounter (HOSPITAL_COMMUNITY): Payer: Self-pay | Admitting: Cardiology

## 2024-06-01 VITALS — BP 110/84 | HR 71 | Ht 71.0 in | Wt 217.2 lb

## 2024-06-01 DIAGNOSIS — I428 Other cardiomyopathies: Secondary | ICD-10-CM | POA: Insufficient documentation

## 2024-06-01 DIAGNOSIS — R42 Dizziness and giddiness: Secondary | ICD-10-CM

## 2024-06-01 DIAGNOSIS — Z85850 Personal history of malignant neoplasm of thyroid: Secondary | ICD-10-CM | POA: Diagnosis not present

## 2024-06-01 DIAGNOSIS — I493 Ventricular premature depolarization: Secondary | ICD-10-CM

## 2024-06-01 DIAGNOSIS — R6882 Decreased libido: Secondary | ICD-10-CM

## 2024-06-01 DIAGNOSIS — G4733 Obstructive sleep apnea (adult) (pediatric): Secondary | ICD-10-CM | POA: Diagnosis not present

## 2024-06-01 DIAGNOSIS — I5020 Unspecified systolic (congestive) heart failure: Secondary | ICD-10-CM | POA: Diagnosis not present

## 2024-06-01 DIAGNOSIS — I5022 Chronic systolic (congestive) heart failure: Secondary | ICD-10-CM | POA: Diagnosis present

## 2024-06-01 DIAGNOSIS — E890 Postprocedural hypothyroidism: Secondary | ICD-10-CM | POA: Diagnosis not present

## 2024-06-01 DIAGNOSIS — Z79890 Hormone replacement therapy (postmenopausal): Secondary | ICD-10-CM | POA: Diagnosis not present

## 2024-06-01 DIAGNOSIS — K219 Gastro-esophageal reflux disease without esophagitis: Secondary | ICD-10-CM | POA: Insufficient documentation

## 2024-06-01 LAB — COMPREHENSIVE METABOLIC PANEL WITH GFR
ALT: 22 U/L (ref 0–44)
AST: 22 U/L (ref 15–41)
Albumin: 3.7 g/dL (ref 3.5–5.0)
Alkaline Phosphatase: 56 U/L (ref 38–126)
Anion gap: 10 (ref 5–15)
BUN: 11 mg/dL (ref 6–20)
CO2: 23 mmol/L (ref 22–32)
Calcium: 9.4 mg/dL (ref 8.9–10.3)
Chloride: 107 mmol/L (ref 98–111)
Creatinine, Ser: 1.03 mg/dL (ref 0.61–1.24)
GFR, Estimated: >60 mL/min (ref >60)
Glucose, Bld: 99 mg/dL (ref 70–99)
Potassium: 3.7 mmol/L (ref 3.5–5.1)
Sodium: 140 mmol/L (ref 135–145)
Total Bilirubin: 0.4 mg/dL (ref 0.0–1.2)
Total Protein: 7.2 g/dL (ref 6.5–8.1)

## 2024-06-01 LAB — BRAIN NATRIURETIC PEPTIDE: B Natriuretic Peptide: 8 pg/mL (ref 0.0–100.0)

## 2024-06-01 MED ORDER — ENTRESTO 24-26 MG PO TABS: 1.0000 | ORAL_TABLET | Freq: Two times a day (BID) | ORAL | 3 refills | Status: DC

## 2024-06-01 NOTE — Patient Instructions (Signed)
 Medication Changes:  DECREASE ENTRESTO  TO 24/26MG  TWICE DAILY   Lab Work:  Labs done today, your results will be available in MyChart, we will contact you for abnormal readings.,  Follow-Up in: 1 MONTH AS SCHEDULED WITH AN ECHO   At the [facility], you and your health needs are our priority. We have a designated team specialized in the treatment of Heart Failure. This Care Team includes your primary Heart Failure Specialized Cardiologist (physician), Advanced Practice Providers (APPs- Physician Assistants and Nurse Practitioners), and Pharmacist who all work together to provide you with the care you need, when you need it.   You may see any of the following providers on your designated Care Team at your next follow up:  Dr. Jules Oar Dr. Peder Bourdon Dr. Alwin Baars Dr. Judyth Nunnery Nieves Bars, NP Ruddy Corral, [place] [facility] [place], [place] Dennise Fitz, NP [place] Lee, NP Luster Salters, PharmD   Please be sure to bring in all your medications bottles to every appointment.   Need to Contact Us :  If you have any questions or concerns before your next appointment please send us  a message through [place] or call our office at [phone].    TO LEAVE A MESSAGE FOR THE NURSE SELECT OPTION 2, PLEASE LEAVE A MESSAGE INCLUDING: YOUR NAME DATE OF BIRTH CALL BACK NUMBER REASON FOR CALL**this is important as we prioritize the call backs  YOU WILL RECEIVE A CALL BACK THE SAME DAY AS LONG AS YOU CALL BEFORE 4:00 PM

## 2024-06-15 ENCOUNTER — Ambulatory Visit (HOSPITAL_COMMUNITY)
Admission: RE | Admit: 2024-06-15 | Discharge: 2024-06-15 | Disposition: A | Discharge status: Home / Self Care | Payer: Self-pay | Source: Ambulatory Visit | Attending: Cardiology | Admitting: Cardiology

## 2024-06-15 DIAGNOSIS — I5020 Unspecified systolic (congestive) heart failure: Secondary | ICD-10-CM | POA: Diagnosis not present

## 2024-06-15 DIAGNOSIS — I517 Cardiomegaly: Secondary | ICD-10-CM | POA: Diagnosis not present

## 2024-06-15 DIAGNOSIS — I5030 Unspecified diastolic (congestive) heart failure: Secondary | ICD-10-CM

## 2024-06-15 LAB — ECHOCARDIOGRAM COMPLETE
Area-P 1/2: 3.37 cm2
Calc EF: 58.6 %
Est EF: 50
S' Lateral: 4 cm
Single Plane A2C EF: 56.4 %
Single Plane A4C EF: 59.8 %

## 2024-06-16 ENCOUNTER — Other Ambulatory Visit: Payer: Self-pay | Admitting: Otolaryngology

## 2024-06-17 ENCOUNTER — Other Ambulatory Visit: Payer: Self-pay

## 2024-06-17 ENCOUNTER — Encounter (HOSPITAL_BASED_OUTPATIENT_CLINIC_OR_DEPARTMENT_OTHER): Payer: Self-pay | Admitting: Otolaryngology

## 2024-06-18 ENCOUNTER — Telehealth (HOSPITAL_COMMUNITY): Payer: Self-pay | Admitting: Cardiology

## 2024-06-18 NOTE — Telephone Encounter (Signed)
 Patient called to check the status of FMLA forms/disability  Reports original fax sent 6/17 Unable to locate to provider update at this time, will check with providers nurse and return call.  -will send second copy as well [phone]

## 2024-06-22 NOTE — Telephone Encounter (Signed)
 FMLA paperwork has been signed and faxed to [employer] with all requested information.   Patient aware and verbalized understanding. Advised patient to call back to office with any issues, questions, or concerns. Patient verbalized understanding.

## 2024-06-24 ENCOUNTER — Ambulatory Visit (HOSPITAL_BASED_OUTPATIENT_CLINIC_OR_DEPARTMENT_OTHER): Admitting: Anesthesiology

## 2024-06-24 ENCOUNTER — Encounter (HOSPITAL_BASED_OUTPATIENT_CLINIC_OR_DEPARTMENT_OTHER): Payer: Self-pay | Admitting: Otolaryngology

## 2024-06-24 ENCOUNTER — Encounter (HOSPITAL_BASED_OUTPATIENT_CLINIC_OR_DEPARTMENT_OTHER)
Admission: RE | Disposition: A | Discharge status: Home / Self Care | Payer: Self-pay | Source: Home / Self Care | Attending: Otolaryngology

## 2024-06-24 ENCOUNTER — Ambulatory Visit (HOSPITAL_BASED_OUTPATIENT_CLINIC_OR_DEPARTMENT_OTHER)
Admission: RE | Admit: 2024-06-24 | Discharge: 2024-06-24 | Disposition: A | Discharge status: Home / Self Care | Attending: Otolaryngology | Admitting: Otolaryngology

## 2024-06-24 DIAGNOSIS — Z87891 Personal history of nicotine dependence: Secondary | ICD-10-CM

## 2024-06-24 DIAGNOSIS — K219 Gastro-esophageal reflux disease without esophagitis: Secondary | ICD-10-CM | POA: Insufficient documentation

## 2024-06-24 DIAGNOSIS — H908 Mixed conductive and sensorineural hearing loss, unspecified: Secondary | ICD-10-CM | POA: Diagnosis not present

## 2024-06-24 DIAGNOSIS — H6993 Unspecified Eustachian tube disorder, bilateral: Secondary | ICD-10-CM | POA: Insufficient documentation

## 2024-06-24 DIAGNOSIS — I110 Hypertensive heart disease with heart failure: Secondary | ICD-10-CM | POA: Diagnosis not present

## 2024-06-24 DIAGNOSIS — H9313 Tinnitus, bilateral: Secondary | ICD-10-CM | POA: Insufficient documentation

## 2024-06-24 DIAGNOSIS — I5022 Chronic systolic (congestive) heart failure: Secondary | ICD-10-CM | POA: Diagnosis not present

## 2024-06-24 DIAGNOSIS — H6983 Other specified disorders of Eustachian tube, bilateral: Secondary | ICD-10-CM

## 2024-06-24 DIAGNOSIS — I5021 Acute systolic (congestive) heart failure: Secondary | ICD-10-CM

## 2024-06-24 DIAGNOSIS — Z01818 Encounter for other preprocedural examination: Secondary | ICD-10-CM

## 2024-06-24 DIAGNOSIS — H906 Mixed conductive and sensorineural hearing loss, bilateral: Secondary | ICD-10-CM | POA: Diagnosis present

## 2024-06-24 HISTORY — PX: NASOPHARYNGOSCOPY EUSTATION TUBE BALLOON DILATION: SHX6729

## 2024-06-24 HISTORY — DX: Gastro-esophageal reflux disease without esophagitis: K21.9

## 2024-06-24 HISTORY — DX: Hypothyroidism, unspecified: E03.9

## 2024-06-24 SURGERY — NASOPHARYNGOSCOPY, WITH EUSTACHIAN TUBE DILATION USING BALLOON
Anesthesia: General | Site: Nose | Laterality: Bilateral

## 2024-06-24 MED ORDER — DEXAMETHASONE SODIUM PHOSPHATE 4 MG/ML IJ SOLN
INTRAMUSCULAR | Status: DC | PRN
  Administered 2024-06-24: 10 mg via INTRAVENOUS

## 2024-06-24 MED ORDER — FENTANYL CITRATE (PF) 100 MCG/2ML IJ SOLN
INTRAMUSCULAR | Status: AC
  Filled 2024-06-24: qty 2

## 2024-06-24 MED ORDER — ACETAMINOPHEN 500 MG PO TABS
ORAL_TABLET | ORAL | Status: AC
  Filled 2024-06-24: qty 2

## 2024-06-24 MED ORDER — PROPOFOL 10 MG/ML IV BOLUS
INTRAVENOUS | Status: AC
  Filled 2024-06-24: qty 20

## 2024-06-24 MED ORDER — METOPROLOL TARTRATE 25 MG PO TABS
50.0000 mg | ORAL_TABLET | Freq: Once | ORAL | Status: AC
  Administered 2024-06-24: 50 mg via ORAL

## 2024-06-24 MED ORDER — ONDANSETRON HCL 4 MG/2ML IJ SOLN
INTRAMUSCULAR | Status: DC | PRN
  Administered 2024-06-24: 4 mg via INTRAVENOUS

## 2024-06-24 MED ORDER — MIDAZOLAM HCL 2 MG/2ML IJ SOLN: 0.5000 mg | Freq: Once | INTRAMUSCULAR | Status: DC | PRN

## 2024-06-24 MED ORDER — EPINEPHRINE HCL (NASAL) 0.1 % NA SOLN
NASAL | Status: DC | PRN
  Administered 2024-06-24: 1 [drp] via NASAL

## 2024-06-24 MED ORDER — ACETAMINOPHEN 500 MG PO TABS
1000.0000 mg | ORAL_TABLET | Freq: Once | ORAL | Status: AC
  Administered 2024-06-24: 1000 mg via ORAL

## 2024-06-24 MED ORDER — FENTANYL CITRATE (PF) 100 MCG/2ML IJ SOLN
25.0000 ug | INTRAMUSCULAR | Status: DC | PRN
  Administered 2024-06-24: 50 ug via INTRAVENOUS

## 2024-06-24 MED ORDER — ONDANSETRON HCL 4 MG/2ML IJ SOLN
INTRAMUSCULAR | Status: AC
  Filled 2024-06-24: qty 2

## 2024-06-24 MED ORDER — OXYCODONE HCL 5 MG PO TABS
ORAL_TABLET | ORAL | Status: AC
  Filled 2024-06-24: qty 1

## 2024-06-24 MED ORDER — FENTANYL CITRATE (PF) 100 MCG/2ML IJ SOLN
INTRAMUSCULAR | Status: DC | PRN
  Administered 2024-06-24: 100 ug via INTRAVENOUS

## 2024-06-24 MED ORDER — ROCURONIUM BROMIDE 10 MG/ML (PF) SYRINGE
PREFILLED_SYRINGE | INTRAVENOUS | Status: AC
  Filled 2024-06-24: qty 10

## 2024-06-24 MED ORDER — EPINEPHRINE HCL (NASAL) 0.1 % NA SOLN
NASAL | Status: AC
  Filled 2024-06-24: qty 10

## 2024-06-24 MED ORDER — OXYCODONE HCL 5 MG PO TABS
5.0000 mg | ORAL_TABLET | Freq: Once | ORAL | Status: AC | PRN
  Administered 2024-06-24: 5 mg via ORAL

## 2024-06-24 MED ORDER — PROPOFOL 10 MG/ML IV BOLUS
INTRAVENOUS | Status: DC | PRN
  Administered 2024-06-24 (×2): 100 mg via INTRAVENOUS

## 2024-06-24 MED ORDER — OXYCODONE HCL 5 MG/5ML PO SOLN: 5.0000 mg | Freq: Once | ORAL | Status: AC | PRN

## 2024-06-24 MED ORDER — DEXAMETHASONE SODIUM PHOSPHATE 10 MG/ML IJ SOLN
INTRAMUSCULAR | Status: AC
  Filled 2024-06-24: qty 1

## 2024-06-24 MED ORDER — ROCURONIUM BROMIDE 100 MG/10ML IV SOLN
INTRAVENOUS | Status: DC | PRN
  Administered 2024-06-24: 60 mg via INTRAVENOUS

## 2024-06-24 MED ORDER — SUGAMMADEX SODIUM 200 MG/2ML IV SOLN
INTRAVENOUS | Status: DC | PRN
  Administered 2024-06-24: 400 mg via INTRAVENOUS

## 2024-06-24 MED ORDER — MEPERIDINE HCL 25 MG/ML IJ SOLN: 6.2500 mg | INTRAMUSCULAR | Status: DC | PRN

## 2024-06-24 MED ORDER — LIDOCAINE HCL (CARDIAC) PF 100 MG/5ML IV SOSY
PREFILLED_SYRINGE | INTRAVENOUS | Status: DC | PRN
  Administered 2024-06-24: 20 mg via INTRAVENOUS

## 2024-06-24 MED ORDER — LIDOCAINE 2% (20 MG/ML) 5 ML SYRINGE
INTRAMUSCULAR | Status: AC
  Filled 2024-06-24: qty 5

## 2024-06-24 MED ORDER — LACTATED RINGERS IV SOLN: INTRAVENOUS | Status: DC

## 2024-06-24 MED ORDER — METOPROLOL TARTRATE 25 MG PO TABS
ORAL_TABLET | ORAL | Status: AC
  Filled 2024-06-24: qty 2

## 2024-06-24 SURGICAL SUPPLY — 29 items
APPLICATOR COTTON TIP 6 STRL (MISCELLANEOUS) ×1 IMPLANT
CANISTER SUCT 1200ML W/VALVE (MISCELLANEOUS) ×1 IMPLANT
CATH BALLOON EUST TUBE 6X16 (BALLOONS) IMPLANT
COAGULATOR SUCT 8FR VV (MISCELLANEOUS) IMPLANT
COVER BACK TABLE 60X90IN (DRAPES) ×1 IMPLANT
DEPRESSOR TONGUE 6 IN STERILE (GAUZE/BANDAGES/DRESSINGS) IMPLANT
DEVICE INFLATION 20/61 (MISCELLANEOUS) IMPLANT
DEVICE INFLATION SEID (MISCELLANEOUS) ×2 IMPLANT
ELECTRODE REM PT RTRN 9FT ADLT (ELECTROSURGICAL) ×1 IMPLANT
GLOVE BIO SURGEON STRL SZ7.5 (GLOVE) ×1 IMPLANT
GLOVE BIOGEL PI IND STRL 8 (GLOVE) ×1 IMPLANT
GOWN STRL REUS W/ TWL LRG LVL3 (GOWN DISPOSABLE) ×1 IMPLANT
GOWN STRL REUS W/ TWL XL LVL3 (GOWN DISPOSABLE) ×1 IMPLANT
NDL PRECISIONGLIDE 27X1.5 (NEEDLE) IMPLANT
NDL SPNL 25GX3.5 QUINCKE BL (NEEDLE) IMPLANT
NEEDLE PRECISIONGLIDE 27X1.5 (NEEDLE) IMPLANT
NEEDLE SPNL 25GX3.5 QUINCKE BL (NEEDLE) IMPLANT
PACK BASIN DAY SURGERY FS (CUSTOM PROCEDURE TRAY) IMPLANT
PATTIES SURGICAL .5 X3 (DISPOSABLE) ×1 IMPLANT
SHEATH ENDOSCRUB 0 DEG (SHEATH) ×1 IMPLANT
SHEATH ENDOSCRUB 30 DEG (SHEATH) IMPLANT
SHEET MEDIUM DRAPE 40X70 STRL (DRAPES) ×1 IMPLANT
SOLUTION ANTFG W/FOAM PAD STRL (MISCELLANEOUS) ×1 IMPLANT
SPIKE FLUID TRANSFER (MISCELLANEOUS) IMPLANT
SYR 3ML 23GX1 SAFETY (SYRINGE) IMPLANT
SYR CONTROL 10ML LL (SYRINGE) IMPLANT
TOWEL GREEN STERILE FF (TOWEL DISPOSABLE) ×1 IMPLANT
TUBE CONNECTING 20X1/4 (TUBING) ×1 IMPLANT
YANKAUER SUCT BULB TIP NO VENT (SUCTIONS) ×1 IMPLANT

## 2024-06-24 NOTE — Anesthesia Procedure Notes (Signed)
 Procedure Name: Intubation Date/Time: 06/24/2024 11:50 AM  Performed by: Burnard Rosaline HERO, CRNAPre-anesthesia Checklist: Patient identified, Emergency Drugs available, Suction available and Patient being monitored Patient Re-evaluated:Patient Re-evaluated prior to induction Oxygen Delivery Method: Circle system utilized Preoxygenation: Pre-oxygenation with 100% oxygen Induction Type: IV induction Ventilation: Mask ventilation without difficulty Laryngoscope Size: Mac and 4 Grade View: Grade I Tube type: Oral Tube size: 8.0 mm Number of attempts: 1 Airway Equipment and Method: Stylet and Oral airway Placement Confirmation: ETT inserted through vocal cords under direct vision, positive ETCO2, breath sounds checked- equal and bilateral and CO2 detector Secured at: 23 cm Tube secured with: Tape Dental Injury: Teeth and Oropharynx as per pre-operative assessment

## 2024-06-24 NOTE — Discharge Instructions (Addendum)
 May take Tylenol  after 3:15 pm, if needed.     Post Anesthesia Home Care Instructions  Activity: Get plenty of rest for the remainder of the day. A responsible individual must stay with you for 24 hours following the procedure.  For the next 24 hours, DO NOT: -Drive a car -Advertising copywriter -Drink alcoholic beverages -Take any medication unless instructed by your physician -Make any legal decisions or sign important papers.  Meals: Start with liquid foods such as gelatin or soup. Progress to regular foods as tolerated. Avoid greasy, spicy, heavy foods. If nausea and/or vomiting occur, drink only clear liquids until the nausea and/or vomiting subsides. Call your physician if vomiting continues.  Special Instructions/Symptoms: Your throat may feel dry or sore from the anesthesia or the breathing tube placed in your throat during surgery. If this causes discomfort, gargle with warm salt water . The discomfort should disappear within 24 hours.  If you had a scopolamine patch placed behind your ear for the management of post- operative nausea and/or vomiting:  1. The medication in the patch is effective for 72 hours, after which it should be removed.  Wrap patch in a tissue and discard in the trash. Wash hands thoroughly with soap and water . 2. You may remove the patch earlier than 72 hours if you experience unpleasant side effects which may include dry mouth, dizziness or visual disturbances. 3. Avoid touching the patch. Wash your hands with soap and water  after contact with the patch.

## 2024-06-24 NOTE — Anesthesia Postprocedure Evaluation (Signed)
 Anesthesia Post Note  Patient: Elijah Perez.  Procedure(s) Performed: NASOPHARYNGOSCOPY, WITH EUSTACHIAN TUBE DILATION USING BALLOON (Bilateral: Nose)     Patient location during evaluation: PACU Anesthesia Type: General Level of consciousness: awake and alert, oriented and patient cooperative Pain management: pain level controlled Vital Signs Assessment: post-procedure vital signs reviewed and stable Respiratory status: spontaneous breathing, nonlabored ventilation and respiratory function stable Cardiovascular status: blood pressure returned to baseline and stable Postop Assessment: no apparent nausea or vomiting Anesthetic complications: no   No notable events documented.  Last Vitals:  Vitals:   06/24/24 1230 06/24/24 1243  BP: (!) 143/95   Pulse: 62 61  Resp: 11 15  Temp:    SpO2: 97% 96%    Last Pain:  Vitals:   06/24/24 1243  TempSrc:   PainSc: 4                  Ruthell Feigenbaum,E. Manley Fason

## 2024-06-24 NOTE — Op Note (Signed)
 OPERATIVE NOTE  Elza LITTIE [name]. Date/Time of Admission: 06/24/2024  8:46 AM  CSN: [id number];MRN:[id number] Attending Provider: Luciano Standing, MD Room/Bed: [facility]/NONE DOB: 1966-04-05 Age: 58 y.o.   Pre-Op Diagnosis: Mixed hearing loss; Dysfunction of both eustachian tubes; Tinnitus of both ears  Post-Op Diagnosis: Mixed hearing loss; Dysfunction of both eustachian tubes; Tinnitus of both ears  Procedure: Bilateral rigid nasal endoscopy with eustachian tube balloon dilation (CPT 5184111275)  Anesthesia: General  Surgeon(s): Standing KANDICE Luciano, MD  Staff: Circulator: Victorino Dena SAILOR, RN Scrub Person: Alto Charmaine PARAS Vendor Representative : Manfred Barefoot  Implants: * No implants in log *  Specimens: * No specimens in log *  Complications: none  EBL: none ML  IVF: Per anesthesia ML  Condition: stable  Operative Findings:  Bilateral cartilaginous eustachian tube dilated to 12 ATM x 2 minutes  Description of Operation:  The patient was identified in the pre-operative area and consent confirmed in the chart. The patient was brought to the OR By anesthesia and a pre-op timeout performed.   We then prepared for eustachian tube balloon dilation.  The nose was topically decongested with 1 1000 epinephrine  soaked pledgets.  The 0 and 30 degree rigid nasal endoscope were attached to the video monitoring system and used for the duration of the procedure.  We started on the patient's left side.  A Freer elevator was used to gently lateralized the inferior turbinate.  The eustachian tube opening was visualized with the findings noted above.  The Acclarent eustachian tube balloon measuring 6 mm diameter by 16 mm length was inserted into the eustachian tube opening up to the point where the yellow indicator was just below the opening.  The inflation device was used to inflate the balloon to 12 atm of pressure for a total of 2 minutes.  The balloon was deflated and withdrawn.  There  was no bleeding.  An epinephrine  soaked pledget was placed in the patient's left side of the nose.  We proceeded with performing the identical procedure on the right side.  A Freer elevator was used to gently lateralized the inferior turbinate.  The eustachian tube opening was visualized with the findings noted above.  The Acclarent eustachian tube balloon measuring 6 mm diameter by 16 mm length was inserted into the eustachian tube opening up to the point where the yellow indicator was just below the opening.  The inflation device was used to inflate the balloon to 12 atm of pressure for a total of 2 minutes.  The balloon was deflated and withdrawn.  There was no bleeding.  An epinephrine  soaked pledget was placed in the patient's left side of the nose.  The patient was then turned back to anesthesia who extubated and brought the patient to the recovery room in stable condition.    Standing KANDICE Luciano, MD [facility]  06/24/2024
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
## 2024-06-24 NOTE — H&P (Signed)
 Elza LITTIE [name]. is an 58 y.o. male.    Chief Complaint:  etd  HPI: Patient presents today for planned elective procedure.  He/she denies any interval change in history since office visit on 06/01/24.   Past Medical History:  Diagnosis Date   Cancer (HCC)    thyroid   CHF (congestive heart failure) (HCC)    GERD (gastroesophageal reflux disease)    Gout    HFrEF (heart failure with reduced ejection fraction) (HCC)    a. EF 30-35% in 12/2022 with cath showing no evidence of CAD   Horner's syndrome    Hypothyroidism    Thalassemia trait     Past Surgical History:  Procedure Laterality Date   COLONOSCOPY WITH PROPOFOL  N/A 09/17/2023   Procedure: COLONOSCOPY WITH PROPOFOL ;  Surgeon: Eartha Angelia Sieving, MD;  Location: [facility];  Service: Gastroenterology;  Laterality: N/A;  11:30am, asa 3   EYE SURGERY     RIGHT/LEFT HEART CATH AND CORONARY ANGIOGRAPHY N/A 01/03/2023   Procedure: RIGHT/LEFT HEART CATH AND CORONARY ANGIOGRAPHY;  Surgeon: Verlin Lonni BIRCH, MD;  Location: [facility] INVASIVE CV LAB;  Service: Cardiovascular;  Laterality: N/A;   THYROIDECTOMY Bilateral     Family History  Problem Relation Age of Onset   Valvular heart disease Mother    Stroke Father    Heart murmur Brother    Atrial fibrillation Brother    Cancer - Colon Neg Hx     Social History:  reports that he quit smoking about 29 years ago. His smoking use included cigarettes. He has never used smokeless tobacco. He reports current alcohol use of about 2.0 standard drinks of alcohol per week. He reports that he does not currently use drugs.  Allergies:  Allergies  Allergen Reactions   Other Other (See Comments)    Pet dander, pollen -> eyes itchy/watery, sneezing   Shellfish Allergy Itching   Wound Dressing Adhesive     blisters    Medications Prior to Admission  Medication Sig Dispense Refill   allopurinol  (ZYLOPRIM ) 100 MG tablet Take 100 mg by mouth daily.     cyclobenzaprine   (FLEXERIL ) 10 MG tablet Take 1 tablet (10 mg total) by mouth 2 (two) times daily as needed for muscle spasms. 20 tablet 0   empagliflozin  (JARDIANCE ) 10 MG TABS tablet Take 1 tablet (10 mg total) by mouth daily before breakfast. 30 tablet 11   furosemide  (LASIX ) 40 MG tablet Take 1 tablet (40 mg total) by mouth daily for 3 days. 30 tablet 11   ketotifen (ZADITOR) 0.035 % ophthalmic solution Place 2 drops into both eyes daily.     levothyroxine  (SYNTHROID ) 200 MCG tablet Take 200 mcg by mouth daily before breakfast.     metoprolol  succinate (TOPROL  XL) 50 MG 24 hr tablet Take 1 tablet (50 mg total) by mouth at bedtime. 30 tablet 5   omeprazole (PRILOSEC) 20 MG capsule Take 20 mg by mouth daily.     sacubitril -valsartan  (ENTRESTO ) 24-26 MG Take 1 tablet by mouth 2 (two) times daily. 60 tablet 3   sildenafil  (VIAGRA ) 100 MG tablet Take 0.5 tablets (50 mg total) by mouth daily as needed for erectile dysfunction. 10 tablet 0   spironolactone  (ALDACTONE ) 25 MG tablet Take 1 tablet (25 mg total) by mouth daily. 30 tablet 11   potassium chloride  SA (KLOR-CON  M) 20 MEQ tablet Take 1 tablet (20 mEq total) by mouth daily as needed (when you need to take the Lasix ). 30 tablet 1  No results found for this or any previous visit (from the past 48 hours). No results found.  ROS: negative other than stated in HPI  Blood pressure (!) 132/98, pulse 66, temperature (!) 97.2 F (36.2 C), temperature source Temporal, resp. rate 20, height 5' 11 (1.803 m), weight 98.8 kg, SpO2 100%.  PHYSICAL EXAM: General: Resting comfortably in NAD  Lungs: Non-labored respiratinos  Studies Reviewed: audiogram   Assessment/Plan ETD  Proceed with eustachian tube balloon dilation under GA.    Electronically signed by:  Elspeth Coddington, MD  Staff Physician Facial Plastic & Reconstructive Surgery Otolaryngology - Head and Neck Surgery [facility]   06/24/2024, 9:19 AM

## 2024-06-24 NOTE — Transfer of Care (Signed)
 Immediate Anesthesia Transfer of Care Note  Patient: Elijah Perez.  Procedure(s) Performed: NASOPHARYNGOSCOPY, WITH EUSTACHIAN TUBE DILATION USING BALLOON (Bilateral: Nose)  Patient Location: PACU  Anesthesia Type:General  Level of Consciousness: awake and alert   Airway & Oxygen Therapy: Patient Spontanous Breathing and Patient connected to face mask oxygen  Post-op Assessment: Report given to RN and Post -op Vital signs reviewed and stable  Post vital signs: Reviewed and stable  Last Vitals:  Vitals Value Taken Time  BP 143/95 06/24/24 12:30  Temp 36.2 C 06/24/24 12:15  Pulse 55 06/24/24 12:38  Resp 12 06/24/24 12:38  SpO2 97 % 06/24/24 12:38  Vitals shown include unfiled device data.  Last Pain:  Vitals:   06/24/24 1231  TempSrc:   PainSc: Asleep      Patients Stated Pain Goal: 3 (06/24/24 1228)  Complications: No notable events documented.

## 2024-06-24 NOTE — Anesthesia Preprocedure Evaluation (Signed)
 Anesthesia Evaluation  Patient identified by MRN, date of birth, ID band Patient awake    Reviewed: Allergy & Precautions, NPO status , Patient's Chart, lab work & pertinent test results, reviewed documented beta blocker date and time   History of Anesthesia Complications Negative for: history of anesthetic complications  Airway Mallampati: I  TM Distance: >3 FB Neck ROM: Full    Dental  (+) Dental Advisory Given   Pulmonary neg pulmonary ROS, former smoker   breath sounds clear to auscultation       Cardiovascular hypertension, Pt. on medications and Pt. on home beta blockers +CHF (Entresto )  (-) CAD  Rhythm:Regular Rate:Normal  06/15/2024 ECHO: EF 50%.  1.The LV has mildly decreased function, global hypokinesis. The left ventricular internal cavity size was mildly dilated. Grade I diastolic dysfunction (impaired relaxation). The average left ventricular global longitudinal strain is -14.4 %. The global longitudinal strain is abnormal.   2. RVF is normal. The right ventricular size is normal.   3. The mitral valve is normal in structure. Trivial mitral valve regurgitation. No evidence of mitral stenosis.   4. The aortic valve is tricuspid. Aortic valve regurgitation is trivial. No aortic stenosis is present.     Neuro/Psych negative neurological ROS     GI/Hepatic Neg liver ROS,GERD  Medicated,,  Endo/Other  Hypothyroidism  BMI 30.4  Renal/GU Renal InsufficiencyRenal disease     Musculoskeletal   Abdominal   Peds  Hematology   Anesthesia Other Findings   Reproductive/Obstetrics                              Anesthesia Physical Anesthesia Plan  ASA: 3  Anesthesia Plan: General   Post-op Pain Management: Tylenol  PO (pre-op)*   Induction: Intravenous  PONV Risk Score and Plan: 2 and Ondansetron  and Dexamethasone   Airway Management Planned: Oral ETT  Additional Equipment:    Intra-op Plan:   Post-operative Plan: Extubation in OR  Informed Consent: I have reviewed the patients History and Physical, chart, labs and discussed the procedure including the risks, benefits and alternatives for the proposed anesthesia with the patient or authorized representative who has indicated his/her understanding and acceptance.     Dental advisory given  Plan Discussed with: CRNA and Surgeon  Anesthesia Plan Comments:         Anesthesia Quick Evaluation

## 2024-06-25 ENCOUNTER — Encounter (HOSPITAL_COMMUNITY): Admitting: Cardiology

## 2024-06-25 ENCOUNTER — Encounter (HOSPITAL_BASED_OUTPATIENT_CLINIC_OR_DEPARTMENT_OTHER): Payer: Self-pay | Admitting: Otolaryngology

## 2024-07-03 ENCOUNTER — Telehealth (HOSPITAL_COMMUNITY): Payer: Self-pay

## 2024-07-03 NOTE — Telephone Encounter (Signed)
 Patient called stating missing date on his disability paper work. Dates up dated and paper work re faxed. Patient aware

## 2024-07-08 DIAGNOSIS — R42 Dizziness and giddiness: Secondary | ICD-10-CM | POA: Diagnosis not present

## 2024-08-12 ENCOUNTER — Encounter: Payer: Self-pay | Admitting: *Deleted

## 2024-08-12 ENCOUNTER — Encounter (HOSPITAL_BASED_OUTPATIENT_CLINIC_OR_DEPARTMENT_OTHER): Payer: Self-pay | Admitting: Otolaryngology

## 2024-08-12 ENCOUNTER — Ambulatory Visit: Payer: Self-pay | Admitting: Cardiology

## 2024-08-14 ENCOUNTER — Other Ambulatory Visit: Payer: Self-pay

## 2024-08-14 ENCOUNTER — Emergency Department (HOSPITAL_COMMUNITY)
Admission: EM | Admit: 2024-08-14 | Discharge: 2024-08-14 | Disposition: A | Discharge status: Home / Self Care | Attending: Emergency Medicine | Admitting: Emergency Medicine

## 2024-08-14 ENCOUNTER — Emergency Department (HOSPITAL_COMMUNITY)

## 2024-08-14 ENCOUNTER — Encounter (HOSPITAL_COMMUNITY): Payer: Self-pay | Admitting: Emergency Medicine

## 2024-08-14 DIAGNOSIS — Y93G1 Activity, food preparation and clean up: Secondary | ICD-10-CM | POA: Diagnosis not present

## 2024-08-14 DIAGNOSIS — I509 Heart failure, unspecified: Secondary | ICD-10-CM | POA: Insufficient documentation

## 2024-08-14 DIAGNOSIS — S61210A Laceration without foreign body of right index finger without damage to nail, initial encounter: Secondary | ICD-10-CM | POA: Insufficient documentation

## 2024-08-14 DIAGNOSIS — W268XXA Contact with other sharp object(s), not elsewhere classified, initial encounter: Secondary | ICD-10-CM | POA: Diagnosis not present

## 2024-08-14 DIAGNOSIS — S6991XA Unspecified injury of right wrist, hand and finger(s), initial encounter: Secondary | ICD-10-CM | POA: Diagnosis present

## 2024-08-14 NOTE — ED Notes (Signed)
 Patient transported to X-ray
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
## 2024-08-14 NOTE — Discharge Instructions (Signed)
 Pleasure taking care of you today.  You are seen after you cut the tip of your finger.  Since you cut the tip off there was no way to put sutures in, but we were able to get the bleeding to stop with a special dressing called Surgicel.  Keep the bandage on for at least 24 hours.  After that you can remove the dressing, wash your hands with mild soap and water , keep the wound covered and change the dressing at least daily.  Eventually the wound will heal, but you may have a flat spot on your finger.  Follow-up for wound check with your PCP in a few days.  Come back to the ER if you have redness, swelling, drainage or severe pain.  Fortunately your x-ray did not show any signs that you had injured the bone.

## 2024-08-14 NOTE — ED Triage Notes (Signed)
 Pt c.o laceration from a meat slicer to right index finger. Bleeding is controled.

## 2024-09-14 ENCOUNTER — Ambulatory Visit: Attending: Cardiology | Admitting: Cardiology

## 2024-09-14 NOTE — Progress Notes (Deleted)
 Clinical Summary Elijah Perez is a 58 y.o.male  1.HFimpEF - new diagnosis during Jan 2024 admission, echo LVEF 30-35%  - Jan 2024 RHC/LHC: no CAD. RA 1, RV 22/0/2, PA 23/5 mean 12, PCWP 7, LV 102/3/9, AO 108/71, CO 4.97 L/min, CI 2.22    -04/2023 cMRI: LVEF 35%, no specific myocardial findings - 06/2023 echo: LVEF 40-45% - 02/2024 echo: LVEF 40-45%, grade I dd, normal RV function - 05/2024 echo: LVEF 50%, grade I dd, normal RV function - no SOB/DOE. - can be lightheaded when standing. Drinking 60 ounces per day. Can have some associated palpitations. Started about 1 month   May 2025 labs K 4.5    2. OSA - mild sleep apena per patient report.    3. PVCs  ziopatch previously with PVC burden of 6% - Repeat ziopatch with close to 10% PVC burden Past Medical History:  Diagnosis Date   Cancer (HCC)    thyroid   CHF (congestive heart failure) (HCC)    GERD (gastroesophageal reflux disease)    Gout    HFrEF (heart failure with reduced ejection fraction) (HCC)    a. EF 30-35% in 12/2022 with cath showing no evidence of CAD   Horner's syndrome    Hypothyroidism    Thalassemia trait      Allergies  Allergen Reactions   Other Other (See Comments)    Pet dander, pollen -> eyes itchy/watery, sneezing   Shellfish Allergy Itching   Wound Dressing Adhesive     blisters     Current Outpatient Medications  Medication Sig Dispense Refill   allopurinol  (ZYLOPRIM ) 100 MG tablet Take 100 mg by mouth daily.     cyclobenzaprine  (FLEXERIL ) 10 MG tablet Take 1 tablet (10 mg total) by mouth 2 (two) times daily as needed for muscle spasms. 20 tablet 0   empagliflozin  (JARDIANCE ) 10 MG TABS tablet Take 1 tablet (10 mg total) by mouth daily before breakfast. 30 tablet 11   furosemide  (LASIX ) 40 MG tablet Take 1 tablet (40 mg total) by mouth daily for 3 days. 30 tablet 11   ketotifen (ZADITOR) 0.035 % ophthalmic solution Place 2 drops into both eyes daily.     levothyroxine  (SYNTHROID ) 200  MCG tablet Take 200 mcg by mouth daily before breakfast.     metoprolol  succinate (TOPROL  XL) 50 MG 24 hr tablet Take 1 tablet (50 mg total) by mouth at bedtime. 30 tablet 5   omeprazole (PRILOSEC) 20 MG capsule Take 20 mg by mouth daily.     potassium chloride  SA (KLOR-CON  M) 20 MEQ tablet Take 1 tablet (20 mEq total) by mouth daily as needed (when you need to take the Lasix ). 30 tablet 1   sacubitril -valsartan  (ENTRESTO ) 24-26 MG Take 1 tablet by mouth 2 (two) times daily. 60 tablet 3   sildenafil  (VIAGRA ) 100 MG tablet Take 0.5 tablets (50 mg total) by mouth daily as needed for erectile dysfunction. 10 tablet 0   spironolactone  (ALDACTONE ) 25 MG tablet Take 1 tablet (25 mg total) by mouth daily. 30 tablet 11   No current facility-administered medications for this visit.     Past Surgical History:  Procedure Laterality Date   COLONOSCOPY WITH PROPOFOL  N/A 09/17/2023   Procedure: COLONOSCOPY WITH PROPOFOL ;  Surgeon: Eartha Angelia Sieving, MD;  Location: [facility];  Service: Gastroenterology;  Laterality: N/A;  11:30am, asa 3   EYE SURGERY     NASOPHARYNGOSCOPY EUSTATION TUBE BALLOON DILATION Bilateral 06/24/2024   Procedure: NASOPHARYNGOSCOPY,  WITH EUSTACHIAN TUBE DILATION USING BALLOON;  Surgeon: Luciano Standing, MD;  Location: [facility];  Service: ENT;  Laterality: Bilateral;  BILATERAL EUSTACHIAN TUBE BALLOON DILATION   RIGHT/LEFT HEART CATH AND CORONARY ANGIOGRAPHY N/A 01/03/2023   Procedure: RIGHT/LEFT HEART CATH AND CORONARY ANGIOGRAPHY;  Surgeon: Verlin Lonni BIRCH, MD;  Location: [facility];  Service: Cardiovascular;  Laterality: N/A;   THYROIDECTOMY Bilateral      Allergies  Allergen Reactions   Other Other (See Comments)    Pet dander, pollen -> eyes itchy/watery, sneezing   Shellfish Allergy Itching   Wound Dressing Adhesive     blisters      Family History  Problem Relation Age of Onset   Valvular heart disease Mother    Stroke  Father    Heart murmur Brother    Atrial fibrillation Brother    Cancer - Colon Neg Hx      Social History Elijah Perez reports that he quit smoking about 29 years ago. His smoking use included cigarettes. He has never used smokeless tobacco. Elijah Perez reports current alcohol use of about 2.0 standard drinks of alcohol per week.   Review of Systems CONSTITUTIONAL: No weight loss, fever, chills, weakness or fatigue.  HEENT: Eyes: No visual loss, blurred vision, double vision or yellow sclerae.No hearing loss, sneezing, congestion, runny nose or sore throat.  SKIN: No rash or itching.  CARDIOVASCULAR:  RESPIRATORY: No shortness of breath, cough or sputum.  GASTROINTESTINAL: No anorexia, nausea, vomiting or diarrhea. No abdominal pain or blood.  GENITOURINARY: No burning on urination, no polyuria NEUROLOGICAL: No headache, dizziness, syncope, paralysis, ataxia, numbness or tingling in the extremities. No change in bowel or bladder control.  MUSCULOSKELETAL: No muscle, back pain, joint pain or stiffness.  LYMPHATICS: No enlarged nodes. No history of splenectomy.  PSYCHIATRIC: No history of depression or anxiety.  ENDOCRINOLOGIC: No reports of sweating, cold or heat intolerance. No polyuria or polydipsia.  SABRA   Physical Examination There were no vitals filed for this visit. There were no vitals filed for this visit.  Gen: resting comfortably, no acute distress HEENT: no scleral icterus, pupils equal round and reactive, no palptable cervical adenopathy,  CV Resp: Clear to auscultation bilaterally GI: abdomen is soft, non-tender, non-distended, normal bowel sounds, no hepatosplenomegaly MSK: extremities are warm, no edema.  Skin: warm, no rash Neuro:  no focal deficits Psych: appropriate affect   Diagnostic Studies     Assessment and Plan        Dorn PHEBE Ross, M.D., F.A.C.C.

## 2024-09-30 ENCOUNTER — Encounter (INDEPENDENT_AMBULATORY_CARE_PROVIDER_SITE_OTHER): Payer: Self-pay | Admitting: Gastroenterology

## 2024-10-05 ENCOUNTER — Telehealth: Payer: Self-pay | Admitting: Cardiology

## 2024-10-05 DIAGNOSIS — I5020 Unspecified systolic (congestive) heart failure: Secondary | ICD-10-CM

## 2024-10-05 MED ORDER — METOPROLOL SUCCINATE ER 50 MG PO TB24: 50.0000 mg | ORAL_TABLET | Freq: Every day | ORAL | 2 refills | Status: AC

## 2024-10-05 NOTE — Telephone Encounter (Signed)
 Refill sent to patients pharmacy.

## 2024-10-05 NOTE — Telephone Encounter (Signed)
*  STAT* If patient is at the pharmacy, call can be transferred to refill team.   1. Which medications need to be refilled? (please list name of each medication and dose if known) metoprolol  succinate (TOPROL  XL) 50 MG 24 hr tablet    2. Would you like to learn more about the convenience, safety, & potential cost savings by using the [facility]? No   3. Are you open to using the [facility] (Type [facility]) No   4. Which pharmacy/location (including street and city if local pharmacy) is medication to be sent to?  [facility] - [place], [address] - [address]   5. Do they need a 30 day or 90 day supply? 90 day

## 2024-10-13 ENCOUNTER — Encounter (HOSPITAL_COMMUNITY): Payer: Self-pay | Admitting: Infectious Diseases

## 2024-10-13 ENCOUNTER — Other Ambulatory Visit (HOSPITAL_COMMUNITY): Payer: Self-pay | Admitting: Infectious Diseases

## 2024-10-13 DIAGNOSIS — R42 Dizziness and giddiness: Secondary | ICD-10-CM

## 2024-10-19 ENCOUNTER — Ambulatory Visit (HOSPITAL_COMMUNITY)
Admission: RE | Admit: 2024-10-19 | Discharge: 2024-10-19 | Disposition: A | Discharge status: Home / Self Care | Source: Ambulatory Visit | Attending: Infectious Diseases | Admitting: Infectious Diseases

## 2024-10-19 DIAGNOSIS — R42 Dizziness and giddiness: Secondary | ICD-10-CM | POA: Diagnosis not present

## 2024-10-20 ENCOUNTER — Emergency Department (HOSPITAL_COMMUNITY)

## 2024-10-20 ENCOUNTER — Encounter (HOSPITAL_COMMUNITY): Payer: Self-pay | Admitting: *Deleted

## 2024-10-20 ENCOUNTER — Inpatient Hospital Stay (HOSPITAL_COMMUNITY)
Admission: EM | Admit: 2024-10-20 | Discharge: 2024-10-25 | DRG: 277 | Disposition: A | Discharge status: Home / Self Care | Attending: Internal Medicine | Admitting: Internal Medicine

## 2024-10-20 ENCOUNTER — Other Ambulatory Visit: Payer: Self-pay

## 2024-10-20 DIAGNOSIS — I77810 Thoracic aortic ectasia: Secondary | ICD-10-CM | POA: Diagnosis present

## 2024-10-20 DIAGNOSIS — E66811 Obesity, class 1: Secondary | ICD-10-CM | POA: Diagnosis present

## 2024-10-20 DIAGNOSIS — I428 Other cardiomyopathies: Secondary | ICD-10-CM | POA: Diagnosis present

## 2024-10-20 DIAGNOSIS — I5032 Chronic diastolic (congestive) heart failure: Secondary | ICD-10-CM | POA: Diagnosis present

## 2024-10-20 DIAGNOSIS — Z87891 Personal history of nicotine dependence: Secondary | ICD-10-CM

## 2024-10-20 DIAGNOSIS — Z79899 Other long term (current) drug therapy: Secondary | ICD-10-CM

## 2024-10-20 DIAGNOSIS — Z823 Family history of stroke: Secondary | ICD-10-CM

## 2024-10-20 DIAGNOSIS — E039 Hypothyroidism, unspecified: Secondary | ICD-10-CM | POA: Diagnosis present

## 2024-10-20 DIAGNOSIS — Z85850 Personal history of malignant neoplasm of thyroid: Secondary | ICD-10-CM

## 2024-10-20 DIAGNOSIS — Z8249 Family history of ischemic heart disease and other diseases of the circulatory system: Secondary | ICD-10-CM

## 2024-10-20 DIAGNOSIS — G4733 Obstructive sleep apnea (adult) (pediatric): Secondary | ICD-10-CM | POA: Diagnosis present

## 2024-10-20 DIAGNOSIS — D563 Thalassemia minor: Secondary | ICD-10-CM | POA: Diagnosis present

## 2024-10-20 DIAGNOSIS — E890 Postprocedural hypothyroidism: Secondary | ICD-10-CM | POA: Diagnosis present

## 2024-10-20 DIAGNOSIS — Z91013 Allergy to seafood: Secondary | ICD-10-CM

## 2024-10-20 DIAGNOSIS — I25118 Atherosclerotic heart disease of native coronary artery with other forms of angina pectoris: Secondary | ICD-10-CM | POA: Diagnosis present

## 2024-10-20 DIAGNOSIS — K5900 Constipation, unspecified: Secondary | ICD-10-CM | POA: Diagnosis present

## 2024-10-20 DIAGNOSIS — M109 Gout, unspecified: Secondary | ICD-10-CM | POA: Diagnosis present

## 2024-10-20 DIAGNOSIS — I493 Ventricular premature depolarization: Secondary | ICD-10-CM | POA: Diagnosis present

## 2024-10-20 DIAGNOSIS — R079 Chest pain, unspecified: Principal | ICD-10-CM | POA: Diagnosis present

## 2024-10-20 DIAGNOSIS — G902 Horner's syndrome: Secondary | ICD-10-CM | POA: Diagnosis present

## 2024-10-20 DIAGNOSIS — K219 Gastro-esophageal reflux disease without esophagitis: Secondary | ICD-10-CM | POA: Diagnosis present

## 2024-10-20 DIAGNOSIS — I340 Nonrheumatic mitral (valve) insufficiency: Secondary | ICD-10-CM | POA: Diagnosis present

## 2024-10-20 DIAGNOSIS — Z6830 Body mass index (BMI) 30.0-30.9, adult: Secondary | ICD-10-CM

## 2024-10-20 DIAGNOSIS — I4729 Other ventricular tachycardia: Secondary | ICD-10-CM

## 2024-10-20 DIAGNOSIS — I472 Ventricular tachycardia, unspecified: Principal | ICD-10-CM | POA: Diagnosis present

## 2024-10-20 DIAGNOSIS — I4510 Unspecified right bundle-branch block: Secondary | ICD-10-CM | POA: Diagnosis present

## 2024-10-20 DIAGNOSIS — Z91048 Other nonmedicinal substance allergy status: Secondary | ICD-10-CM

## 2024-10-20 DIAGNOSIS — Z923 Personal history of irradiation: Secondary | ICD-10-CM

## 2024-10-20 DIAGNOSIS — Z79890 Hormone replacement therapy (postmenopausal): Secondary | ICD-10-CM

## 2024-10-20 DIAGNOSIS — I110 Hypertensive heart disease with heart failure: Secondary | ICD-10-CM | POA: Diagnosis present

## 2024-10-20 DIAGNOSIS — N179 Acute kidney failure, unspecified: Secondary | ICD-10-CM | POA: Diagnosis not present

## 2024-10-20 LAB — BASIC METABOLIC PANEL WITH GFR
Anion gap: 11 (ref 5–15)
BUN: 16 mg/dL (ref 6–20)
CO2: 26 mmol/L (ref 22–32)
Calcium: 9.6 mg/dL (ref 8.9–10.3)
Chloride: 101 mmol/L (ref 98–111)
Creatinine, Ser: 1.04 mg/dL (ref 0.61–1.24)
GFR, Estimated: >60 mL/min (ref >60)
Glucose, Bld: 101 mg/dL — ABNORMAL HIGH (ref 70–99)
Potassium: 3.8 mmol/L (ref 3.5–5.1)
Sodium: 138 mmol/L (ref 135–145)

## 2024-10-20 LAB — CBC
HCT: 44.9 % (ref 39.0–52.0)
Hemoglobin: 13.7 g/dL (ref 13.0–17.0)
MCH: 22.7 pg — ABNORMAL LOW (ref 26.0–34.0)
MCHC: 30.5 g/dL (ref 30.0–36.0)
MCV: 74.5 fL — ABNORMAL LOW (ref 80.0–100.0)
Platelets: 219 K/uL (ref 150–400)
RBC: 6.03 MIL/uL — ABNORMAL HIGH (ref 4.22–5.81)
RDW: 17.3 % — ABNORMAL HIGH (ref 11.5–15.5)
WBC: 4.2 K/uL (ref 4.0–10.5)
nRBC: 0 % (ref 0.0–0.2)

## 2024-10-20 LAB — TROPONIN T, HIGH SENSITIVITY
Troponin T High Sensitivity: 18 ng/L (ref 0–19)
Troponin T High Sensitivity: 22 ng/L — ABNORMAL HIGH (ref 0–19)
Troponin T High Sensitivity: 22 ng/L — ABNORMAL HIGH (ref 0–19)

## 2024-10-20 LAB — MAGNESIUM: Magnesium: 2.2 mg/dL (ref 1.7–2.4)

## 2024-10-20 LAB — PRO BRAIN NATRIURETIC PEPTIDE: Pro Brain Natriuretic Peptide: 90.6 pg/mL (ref <300.0)

## 2024-10-20 MED ORDER — SACUBITRIL-VALSARTAN 24-26 MG PO TABS
1.0000 | ORAL_TABLET | Freq: Two times a day (BID) | ORAL | Status: DC
  Administered 2024-10-20 – 2024-10-25 (×10): 1 via ORAL
  Filled 2024-10-20 (×10): qty 1

## 2024-10-20 MED ORDER — METOPROLOL SUCCINATE ER 50 MG PO TB24
50.0000 mg | ORAL_TABLET | Freq: Every day | ORAL | Status: DC
Start: 2024-10-21 — End: 2024-10-20

## 2024-10-20 MED ORDER — ALUM & MAG HYDROXIDE-SIMETH 200-200-20 MG/5ML PO SUSP
30.0000 mL | Freq: Once | ORAL | Status: AC
  Administered 2024-10-20: 30 mL via ORAL
  Filled 2024-10-20: qty 30

## 2024-10-20 MED ORDER — SPIRONOLACTONE-HCTZ 25-25 MG PO TABS: 1.0000 | ORAL_TABLET | Freq: Every day | ORAL | Status: DC

## 2024-10-20 MED ORDER — HYDROCHLOROTHIAZIDE 25 MG PO TABS
25.0000 mg | ORAL_TABLET | Freq: Every day | ORAL | Status: DC
  Administered 2024-10-21 – 2024-10-22 (×2): 25 mg via ORAL
  Filled 2024-10-20 (×2): qty 1

## 2024-10-20 MED ORDER — METOPROLOL SUCCINATE ER 50 MG PO TB24
50.0000 mg | ORAL_TABLET | ORAL | Status: AC
  Administered 2024-10-20: 50 mg via ORAL
  Filled 2024-10-20: qty 1

## 2024-10-20 MED ORDER — SPIRONOLACTONE 25 MG PO TABS
25.0000 mg | ORAL_TABLET | Freq: Every day | ORAL | Status: DC
  Administered 2024-10-21 – 2024-10-25 (×5): 25 mg via ORAL
  Filled 2024-10-20 (×5): qty 1

## 2024-10-20 MED ORDER — ASPIRIN 81 MG PO CHEW
324.0000 mg | CHEWABLE_TABLET | Freq: Once | ORAL | Status: AC
  Administered 2024-10-20: 324 mg via ORAL
  Filled 2024-10-20: qty 4

## 2024-10-20 MED ORDER — ONDANSETRON HCL 4 MG/2ML IJ SOLN
4.0000 mg | Freq: Once | INTRAMUSCULAR | Status: AC
  Administered 2024-10-20: 4 mg via INTRAVENOUS
  Filled 2024-10-20: qty 2

## 2024-10-20 MED ORDER — MORPHINE SULFATE (PF) 4 MG/ML IV SOLN
4.0000 mg | Freq: Once | INTRAVENOUS | Status: AC
  Administered 2024-10-20: 4 mg via INTRAVENOUS
  Filled 2024-10-20: qty 1

## 2024-10-20 NOTE — ED Triage Notes (Signed)
 Pt with left sided CP while in his office, pt thought it was heart burn at first and took med for it.  Pt states pain went down his left arm.

## 2024-10-20 NOTE — ED Provider Notes (Incomplete)
 [place] EMERGENCY DEPARTMENT AT [facility] Provider Note   CSN: [id number] Arrival date & time: 10/20/24  1447     Patient presents with: Chest Pain   Elijah Perez. is a 58 y.o. male.  {Add pertinent medical, surgical, social history, OB history to HPI:32947}  Chest Pain Associated symptoms: nausea   Associated symptoms: no abdominal pain, no back pain, no cough, no dizziness, no fever, no headache, no palpitations, no vomiting and no weakness        Elijah Perez. is a 58 y.o. 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  Symptoms began around 2 PM.  Describes a pressure sensation of his left chest with a single episode of sharp pain that radiated down his left arm.  Pain was associated with some nausea and diaphoresis as well.  He took antacid and Pepto-Bismol without relief of his symptoms.  Pain has somewhat improved although not resolved prior to ER arrival.  Given aspirin  upon arrival.  Denies back pain, vomiting, shortness of breath  He is followed by cardiology in [place].  Had echo in June that showed LV EF of 50%.  This is improved from echo from May 2025 with LVEF of 40 to 45%  Prior to Admission medications   Medication Sig Start Date End Date Taking? Authorizing Provider  allopurinol  (ZYLOPRIM ) 100 MG tablet Take 100 mg by mouth daily. 11/26/19   [provider]  cyclobenzaprine  (FLEXERIL ) 10 MG tablet Take 1 tablet (10 mg total) by mouth 2 (two) times daily as needed for muscle spasms. 12/25/23   Mannie Fairy DASEN, DO  empagliflozin  (JARDIANCE ) 10 MG TABS tablet Take 1 tablet (10 mg total) by mouth daily before breakfast. 03/05/24   Sabharwal, Aditya, DO  furosemide  (LASIX ) 40 MG tablet Take 1 tablet (40 mg total) by mouth  daily for 3 days. 03/04/24 06/17/24  Sabharwal, Aditya, DO  ketotifen (ZADITOR) 0.035 % ophthalmic solution Place 2 drops into both eyes daily. 12/23/23   [provider]  levothyroxine  (SYNTHROID ) 200 MCG tablet Take 200 mcg by mouth daily before breakfast.    [provider]  metoprolol  succinate (TOPROL  XL) 50 MG 24 hr tablet Take 1 tablet (50 mg total) by mouth at bedtime. 10/05/24   Alvan Dorn FALCON, MD  omeprazole (PRILOSEC) 20 MG capsule Take 20 mg by mouth daily.    [provider]  potassium chloride  SA (KLOR-CON  M) 20 MEQ tablet Take 1 tablet (20 mEq total) by mouth daily as needed (when you need to take the Lasix ). 03/04/24   Sabharwal, Aditya, DO  sacubitril -valsartan  (ENTRESTO ) 24-26 MG Take 1 tablet by mouth 2 (two) times daily. 06/01/24   Sabharwal, Aditya, DO  sildenafil  (VIAGRA ) 100 MG tablet Take 0.5 tablets (50 mg total) by mouth daily as needed for erectile dysfunction. 07/01/23   Sabharwal, Aditya, DO  spironolactone  (ALDACTONE ) 25 MG tablet Take 1 tablet (25 mg total) by mouth daily. 03/04/24   Sabharwal, Ria, DO    Allergies: Other, Shellfish allergy, and Wound dressing adhesive    Review of Systems  Constitutional:  Negative for appetite change, chills and fever.  Respiratory:  Positive for chest tightness. Negative for cough.   Cardiovascular:  Positive for chest pain. Negative for palpitations.  Gastrointestinal:  Positive for nausea. Negative for abdominal  pain and vomiting.  Musculoskeletal:  Negative for back pain.  Neurological:  Negative for dizziness, weakness and headaches.    Updated Vital Signs BP (!) 169/125   Pulse 79   Temp 97.9 F (36.6 C) (Oral)   Resp 20   Ht 5' 11 (1.803 m)   Wt 99.8 kg   SpO2 100%   BMI 30.68 kg/m   Physical Exam Vitals and nursing note reviewed.  Constitutional:      General: He is not in acute distress.    Appearance: He is well-developed. He is not ill-appearing or toxic-appearing.   Cardiovascular:     Rate and Rhythm: Normal rate and regular rhythm.     Pulses: Normal pulses.  Pulmonary:     Effort: Pulmonary effort is normal.  Abdominal:     Palpations: Abdomen is soft.     Tenderness: There is no abdominal tenderness.  Musculoskeletal:     Cervical back: Normal range of motion.     Right lower leg: No edema.     Left lower leg: No edema.  Skin:    General: Skin is warm.     Capillary Refill: Capillary refill takes less than 2 seconds.  Neurological:     General: No focal deficit present.     Mental Status: He is alert.     Sensory: No sensory deficit.     Motor: No weakness.     (all labs ordered are listed, but only abnormal results are displayed) Labs Reviewed  BASIC METABOLIC PANEL WITH GFR - Abnormal; Notable for the following components:      Result Value   Glucose, Bld 101 (*)    All other components within normal limits  CBC - Abnormal; Notable for the following components:   RBC 6.03 (*)    MCV 74.5 (*)    MCH 22.7 (*)    RDW 17.3 (*)    All other components within normal limits  TROPONIN T, HIGH SENSITIVITY - Abnormal; Notable for the following components:   Troponin T High Sensitivity 22 (*)    All other components within normal limits  PRO BRAIN NATRIURETIC PEPTIDE  MAGNESIUM  TROPONIN T, HIGH SENSITIVITY    EKG: EKG Interpretation Date/Time:  Tuesday October 20 2024 14:56:57 EST Ventricular Rate:  76 PR Interval:  186 QRS Duration:  116 QT Interval:  362 QTC Calculation: 407 R Axis:   -27  Text Interpretation: Normal sinus rhythm Cannot rule out Anterior infarct , age undetermined ST & T wave abnormality, consider inferolateral ischemia Abnormal ECG Confirmed by Melvenia Motto [id number]) on 10/20/2024 3:34:56 PM  Radiology: [facility] Chest 2 View Result Date: 10/20/2024 EXAM: 2 VIEW(S) XRAY OF THE CHEST 10/20/2024 03:18:00 PM COMPARISON: Chest x-ray 10/23/2023. CLINICAL HISTORY: CP. FINDINGS: LUNGS AND PLEURA: No focal pulmonary  opacity. No pulmonary edema. No pleural effusion. No pneumothorax. HEART AND MEDIASTINUM: No acute abnormality of the cardiac and mediastinal silhouettes. BONES AND SOFT TISSUES: No acute osseous abnormality. IMPRESSION: 1. No acute process. Electronically signed by: Greig Pique MD 10/20/2024 03:50 PM EST RP Workstation: [id number]    {Document cardiac monitor, telemetry assessment procedure when appropriate:32947} Procedures     HEART score 4   Medications Ordered in the ED  aspirin  chewable tablet 324 mg (324 mg Oral Given 10/20/24 1626)      {Click here for ABCD2, HEART and other calculators REFRESH Note before signing:1}  Medical Decision Making   Patient here for evaluation of chest pain that began around 2 PM this afternoon.  Describes left-sided chest pain with episode of sharp pain radiating into his left arm.  Patient does have cardiac history including CHF.  Pain was associated with some nausea and diaphoresis as well.  He took antacid and Pepto-Bismol without relief.  Does not appear to be volume overloaded on exam.  hypertensive without tachycardia tachypnea or hypoxia.  Continues to have dull pressure-like chest pain.  Amount and/or Complexity of Data Reviewed Labs: ordered.    Details: Labs show reassuring BNP and magnesium levels.  Initial troponin of 18 with delta troponin slightly elevated at 22 Radiology: ordered.    Details: Chest x-ray without acute cardiopulmonary process ECG/medicine tests: ordered.    Details: EKG shows normal sinus rhythm cannot rule out anterior infarct age undetermined ST and T wave abnormalities Discussion of management or test interpretation with external provider(s):   Patient here with left-sided chest pain nausea and diaphoresis earlier today.  No improvement with antacid or Pepto-Bismol.  Has cardiac history followed by cardiology in [place].  Had 1 episode of left arm pain as well.  Chest pain improved  after ASA and morphine but has not completely resolved.  Elevated heart score  Discussed findings with cardiology, Dr. Gail who recommends third troponin and address his blood pressure.  Admission to hospital here if chest pain continues for rule out.  On repeat exam, patient's blood pressure improved, currently 130/93 continues to endorse some left-sided chest pain.    Risk OTC drugs. Prescription drug management.     {Document critical care time when appropriate  Document review of labs and clinical decision tools ie CHADS2VASC2, etc  Document your independent review of radiology images and any outside records  Document your discussion with family members, caretakers and with consultants  Document social determinants of health affecting pt's care  Document your decision making why or why not admission, treatments were needed:32947:::1}   Final diagnoses:  None    ED Discharge Orders     None

## 2024-10-20 NOTE — ED Notes (Signed)
 Pt states pta today had sharp pains to left chest with radiation down left arm, during this time he had dizziness, nausea and shob that lasted approx 5 minutes per pt. States since then only has heaviness to left chest rating 3 with left hand numbness. Pt a/o. Non diaphoretic.

## 2024-10-20 NOTE — ED Notes (Signed)
 States heaviness to left chest rating 3  is now rating a 2 and is sore.

## 2024-10-20 NOTE — ED Provider Notes (Signed)
 [place] EMERGENCY DEPARTMENT AT [facility] Provider Note   CSN: [id number] Arrival date & time: 10/20/24  1447     Patient presents with: Chest Pain   Elijah Perez. is a 58 y.o. male.  {Add pertinent medical, surgical, social history, OB history to YEP:67052}  Chest Pain      Elijah Perez. is a 58 y.o. 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  Symptoms began around 2 PM.  Describes a pressure sensation of his left chest with a single episode of sharp pain that radiated down his left arm.  Pain was associated with some nausea and diaphoresis as well.  He took antacid and Pepto-Bismol without relief of his symptoms.  Pain has somewhat improved although not resolved prior to ER arrival.  Given aspirin  upon arrival.  Denies back pain, vomiting, shortness of breath  He is followed by cardiology in [place].  Had echo in June that showed LV EF of 50%.  This is improved from echo from May 2025 with LVEF of 40 to 45%  Prior to Admission medications   Medication Sig Start Date End Date Taking? Authorizing Provider  allopurinol  (ZYLOPRIM ) 100 MG tablet Take 100 mg by mouth daily. 11/26/19   [provider]  cyclobenzaprine  (FLEXERIL ) 10 MG tablet Take 1 tablet (10 mg total) by mouth 2 (two) times daily as needed for muscle spasms. 12/25/23   Mannie Fairy DASEN, DO  empagliflozin  (JARDIANCE ) 10 MG TABS tablet Take 1 tablet (10 mg total) by mouth daily before breakfast. 03/05/24   Sabharwal, Aditya, DO  furosemide  (LASIX ) 40 MG tablet Take 1 tablet (40 mg total) by mouth daily for 3 days. 03/04/24 06/17/24  Sabharwal, Aditya, DO  ketotifen (ZADITOR) 0.035 % ophthalmic solution Place 2 drops into both eyes daily. 12/23/23   [provider]  levothyroxine  (SYNTHROID ) 200  MCG tablet Take 200 mcg by mouth daily before breakfast.    [provider]  metoprolol  succinate (TOPROL  XL) 50 MG 24 hr tablet Take 1 tablet (50 mg total) by mouth at bedtime. 10/05/24   Alvan Dorn FALCON, MD  omeprazole (PRILOSEC) 20 MG capsule Take 20 mg by mouth daily.    [provider]  potassium chloride  SA (KLOR-CON  M) 20 MEQ tablet Take 1 tablet (20 mEq total) by mouth daily as needed (when you need to take the Lasix ). 03/04/24   Sabharwal, Aditya, DO  sacubitril -valsartan  (ENTRESTO ) 24-26 MG Take 1 tablet by mouth 2 (two) times daily. 06/01/24   Sabharwal, Aditya, DO  sildenafil  (VIAGRA ) 100 MG tablet Take 0.5 tablets (50 mg total) by mouth daily as needed for erectile dysfunction. 07/01/23   Sabharwal, Aditya, DO  spironolactone  (ALDACTONE ) 25 MG tablet Take 1 tablet (25 mg total) by mouth daily. 03/04/24   Sabharwal, Ria, DO    Allergies: Other, Shellfish allergy, and Wound dressing adhesive    Review of Systems  Cardiovascular:  Positive for chest pain.    Updated Vital Signs BP (!) 169/125   Pulse 79   Temp 97.9 F (36.6 C) (Oral)   Resp 20   Ht 5' 11 (1.803 m)   Wt 99.8 kg   SpO2 100%   BMI 30.68 kg/m   Physical Exam  (all labs ordered are listed, but only abnormal results are displayed) Labs Reviewed  BASIC  METABOLIC PANEL WITH GFR - Abnormal; Notable for the following components:      Result Value   Glucose, Bld 101 (*)    All other components within normal limits  CBC - Abnormal; Notable for the following components:   RBC 6.03 (*)    MCV 74.5 (*)    MCH 22.7 (*)    RDW 17.3 (*)    All other components within normal limits  PRO BRAIN NATRIURETIC PEPTIDE  TROPONIN T, HIGH SENSITIVITY  TROPONIN T, HIGH SENSITIVITY    EKG: EKG Interpretation Date/Time:  Tuesday October 20 2024 14:56:57 EST Ventricular Rate:  76 PR Interval:  186 QRS Duration:  116 QT Interval:  362 QTC Calculation: 407 R Axis:   -27  Text  Interpretation: Normal sinus rhythm Cannot rule out Anterior infarct , age undetermined ST & T wave abnormality, consider inferolateral ischemia Abnormal ECG Confirmed by Melvenia Motto [id number]) on 10/20/2024 3:34:56 PM  Radiology: [facility] Chest 2 View Result Date: 10/20/2024 EXAM: 2 VIEW(S) XRAY OF THE CHEST 10/20/2024 03:18:00 PM COMPARISON: Chest x-ray 10/23/2023. CLINICAL HISTORY: CP. FINDINGS: LUNGS AND PLEURA: No focal pulmonary opacity. No pulmonary edema. No pleural effusion. No pneumothorax. HEART AND MEDIASTINUM: No acute abnormality of the cardiac and mediastinal silhouettes. BONES AND SOFT TISSUES: No acute osseous abnormality. IMPRESSION: 1. No acute process. Electronically signed by: Greig Pique MD 10/20/2024 03:50 PM EST RP Workstation: [id number]    {Document cardiac monitor, telemetry assessment procedure when appropriate:32947} Procedures   Medications Ordered in the ED  aspirin  chewable tablet 324 mg (324 mg Oral Given 10/20/24 1626)      {Click here for ABCD2, HEART and other calculators REFRESH Note before signing:1}                              Medical Decision Making Amount and/or Complexity of Data Reviewed Labs: ordered. Radiology: ordered.  Risk OTC drugs.   ***  {Document critical care time when appropriate  Document review of labs and clinical decision tools ie CHADS2VASC2, etc  Document your independent review of radiology images and any outside records  Document your discussion with family members, caretakers and with consultants  Document social determinants of health affecting pt's care  Document your decision making why or why not admission, treatments were needed:32947:::1}   Final diagnoses:  None    ED Discharge Orders     None

## 2024-10-21 ENCOUNTER — Encounter (HOSPITAL_COMMUNITY): Payer: Self-pay | Admitting: Internal Medicine

## 2024-10-21 ENCOUNTER — Observation Stay (HOSPITAL_COMMUNITY)

## 2024-10-21 DIAGNOSIS — I428 Other cardiomyopathies: Secondary | ICD-10-CM | POA: Diagnosis not present

## 2024-10-21 DIAGNOSIS — R072 Precordial pain: Secondary | ICD-10-CM

## 2024-10-21 DIAGNOSIS — I5020 Unspecified systolic (congestive) heart failure: Secondary | ICD-10-CM

## 2024-10-21 DIAGNOSIS — R079 Chest pain, unspecified: Secondary | ICD-10-CM

## 2024-10-21 LAB — LIPID PANEL
Cholesterol: 268 mg/dL — ABNORMAL HIGH (ref 0–200)
HDL: 130 mg/dL (ref >40)
LDL Cholesterol: 103 mg/dL — ABNORMAL HIGH (ref 0–99)
Total CHOL/HDL Ratio: 2.1 ratio
Triglycerides: 176 mg/dL — ABNORMAL HIGH (ref <150)
VLDL: 35 mg/dL (ref 0–40)

## 2024-10-21 LAB — CBC
HCT: 45.8 % (ref 39.0–52.0)
Hemoglobin: 14.2 g/dL (ref 13.0–17.0)
MCH: 23 pg — ABNORMAL LOW (ref 26.0–34.0)
MCHC: 31 g/dL (ref 30.0–36.0)
MCV: 74.2 fL — ABNORMAL LOW (ref 80.0–100.0)
Platelets: 208 K/uL (ref 150–400)
RBC: 6.17 MIL/uL — ABNORMAL HIGH (ref 4.22–5.81)
RDW: 17.2 % — ABNORMAL HIGH (ref 11.5–15.5)
WBC: 3.9 K/uL — ABNORMAL LOW (ref 4.0–10.5)
nRBC: 0 % (ref 0.0–0.2)

## 2024-10-21 LAB — ECHOCARDIOGRAM COMPLETE
AR max vel: 3.72 cm2
AV Area VTI: 3.78 cm2
AV Area mean vel: 3.53 cm2
AV Mean grad: 2 mmHg
AV Peak grad: 3.7 mmHg
Ao pk vel: 0.97 m/s
Area-P 1/2: 3.12 cm2
Calc EF: 45.7 %
Height: 71 in
MV M vel: 3.28 m/s
MV Peak grad: 43 mmHg
MV VTI: 3.36 cm2
P 1/2 time: 816 ms
S' Lateral: 4.3 cm
Single Plane A2C EF: 43.8 %
Single Plane A4C EF: 48.4 %
Weight: 3555.58 [oz_av]

## 2024-10-21 LAB — CREATININE, SERUM
Creatinine, Ser: 1.02 mg/dL (ref 0.61–1.24)
GFR, Estimated: >60 mL/min (ref >60)

## 2024-10-21 LAB — HIV ANTIBODY (ROUTINE TESTING W REFLEX): HIV Screen 4th Generation wRfx: NONREACTIVE

## 2024-10-21 MED ORDER — ACETAMINOPHEN 325 MG PO TABS: 650.0000 mg | ORAL_TABLET | ORAL | Status: DC | PRN

## 2024-10-21 MED ORDER — IOHEXOL 350 MG/ML SOLN
75.0000 mL | Freq: Once | INTRAVENOUS | Status: AC | PRN
  Administered 2024-10-21: 75 mL via INTRAVENOUS

## 2024-10-21 MED ORDER — ONDANSETRON HCL 4 MG/2ML IJ SOLN
4.0000 mg | Freq: Four times a day (QID) | INTRAMUSCULAR | Status: DC | PRN
  Administered 2024-10-24: 4 mg via INTRAVENOUS
  Filled 2024-10-21: qty 2

## 2024-10-21 MED ORDER — FERROUS SULFATE 325 (65 FE) MG PO TABS
325.0000 mg | ORAL_TABLET | ORAL | Status: DC
  Administered 2024-10-21 – 2024-10-23 (×2): 325 mg via ORAL
  Filled 2024-10-21 (×3): qty 1

## 2024-10-21 MED ORDER — LEVOTHYROXINE SODIUM 100 MCG PO TABS
200.0000 ug | ORAL_TABLET | Freq: Every day | ORAL | Status: DC
  Administered 2024-10-21 – 2024-10-25 (×5): 200 ug via ORAL
  Filled 2024-10-21 (×3): qty 2
  Filled 2024-10-21 (×2): qty 4

## 2024-10-21 MED ORDER — ENOXAPARIN SODIUM 40 MG/0.4ML IJ SOSY: 40.0000 mg | PREFILLED_SYRINGE | INTRAMUSCULAR | Status: DC

## 2024-10-21 MED ORDER — ENOXAPARIN SODIUM 40 MG/0.4ML IJ SOSY
40.0000 mg | PREFILLED_SYRINGE | INTRAMUSCULAR | Status: DC
  Administered 2024-10-21: 40 mg via SUBCUTANEOUS
  Filled 2024-10-21: qty 0.4

## 2024-10-21 MED ORDER — PANTOPRAZOLE SODIUM 40 MG PO TBEC
40.0000 mg | DELAYED_RELEASE_TABLET | Freq: Every day | ORAL | Status: DC
  Administered 2024-10-21 – 2024-10-25 (×5): 40 mg via ORAL
  Filled 2024-10-21 (×5): qty 1

## 2024-10-21 MED ORDER — EMPAGLIFLOZIN 10 MG PO TABS
10.0000 mg | ORAL_TABLET | Freq: Every day | ORAL | Status: DC
  Administered 2024-10-21 – 2024-10-22 (×2): 10 mg via ORAL
  Filled 2024-10-21 (×2): qty 1

## 2024-10-21 MED ORDER — ALLOPURINOL 100 MG PO TABS
100.0000 mg | ORAL_TABLET | Freq: Every day | ORAL | Status: DC
  Administered 2024-10-21 – 2024-10-25 (×5): 100 mg via ORAL
  Filled 2024-10-21 (×5): qty 1

## 2024-10-21 MED ORDER — KETOTIFEN FUMARATE 0.035 % OP SOLN
2.0000 [drp] | Freq: Every day | OPHTHALMIC | Status: DC
  Administered 2024-10-21 – 2024-10-25 (×5): 2 [drp] via OPHTHALMIC
  Filled 2024-10-21: qty 5

## 2024-10-21 NOTE — Consult Note (Signed)
 CARDIOLOGY CONSULTATION  Patient ID: Elijah Perez.; [id number]; 1966/07/23   Admit date: 10/20/2024 Date of Consult: 10/21/2024  Primary Care Provider: [facility] Primary Cardiologist: Alvan Carrier, MD  HISTORY OF PRESENT ILLNESS  Elijah Perez is a 58 y.o. male currently admitted to the hospitalist service for evaluation of recent onset chest discomfort.  He states that he was in his office yesterday checking emails, suddenly developed a sharp left-sided discomfort that was fairly intense, thought that it may have been related to reflux so he got up to go get some Pepto-Bismol.  Pain eventually moved into his left arm and he drove himself to the ER for further evaluation.  He states that during his chest discomfort he had an intermittent cough, felt like he needed to belch, had no nausea or emesis.  Denies any recent fevers or chills, did have some thoracic discomfort a few days prior that he thought might have been related to lifting some plants.  He does not report any regular exertional chest pain, generally with NYHA class I dyspnea and no progressive fluid retention.  Cardiac history includes nonischemic cardiomyopathy diagnosed in January of last year.  LVEF initially 30 to 35% range.  He underwent right and left heart catheterization in January 2024 showing normal right heart pressures and left heart filling pressures, no angiographic evidence of CAD.  Cardiac MRI in May 2024 showed LVEF 35% with RVEF 50%, no evidence of infiltrative disease.  He has also evidence of frequent PVCs by cardiac monitoring, as high as 10% burden by cardiac monitor however down to 1.1% most recently in June, managed with beta-blocker with concurrent follow-up in the heart failure clinic.  His most recent LVEF in June had improved to 50%.  He does not report any sense of palpitations with the symptoms described above.  No sudden unexplained syncope.  He tells me that chest discomfort was intense  for about 10 minutes and tailed off from there.  He was given aspirin , morphine, and GI cocktail at different times during his initial ER evaluation.  High-sensitivity troponin T levels are not consistent with ACS, 18 up to 22.-BNP 91.  ECG shows sinus rhythm with decreased R wave progression and nonspecific ST changes which are more prominent compared to prior tracing in June.  Chest x-ray reports no acute process.  He  ROS  Pertinent review in history of present illness.  No orthopnea or PND.  States that when he retains fluid it tends to be in the upper extremities, no recent concerns.  Past Medical History:  Diagnosis Date   Cancer (HCC)    thyroid   GERD (gastroesophageal reflux disease)    Gout    HFrEF (heart failure with reduced ejection fraction) (HCC)    a. EF 30-35% in 12/2022 with cath showing no evidence of CAD   Horner's syndrome    Hypothyroidism    Thalassemia trait     Past Surgical History:  Procedure Laterality Date   COLONOSCOPY WITH PROPOFOL  N/A 09/17/2023   Procedure: COLONOSCOPY WITH PROPOFOL ;  Surgeon: Eartha Angelia Sieving, MD;  [facility];  Service: Gastroenterology;  Laterality: N/A;  11:30am, asa 3   EYE SURGERY     NASOPHARYNGOSCOPY EUSTATION TUBE BALLOON DILATION Bilateral 06/24/2024   Procedure: NASOPHARYNGOSCOPY, WITH EUSTACHIAN TUBE DILATION USING BALLOON;  Surgeon: Luciano Standing, MD;  Location: [facility];  Service: ENT;  Laterality: Bilateral;  BILATERAL EUSTACHIAN TUBE BALLOON DILATION   RIGHT/LEFT HEART CATH AND CORONARY  ANGIOGRAPHY N/A 01/03/2023   Procedure: RIGHT/LEFT HEART CATH AND CORONARY ANGIOGRAPHY;  Surgeon: Verlin Lonni BIRCH, MD;  [facility];  Service: Cardiovascular;  Laterality: N/A;   THYROIDECTOMY Bilateral      INPATIENT MEDICATIONS Scheduled Meds:  allopurinol   100 mg Oral Daily   empagliflozin   10 mg Oral QAC breakfast   enoxaparin  (LOVENOX ) injection  40 mg Subcutaneous Q24H    ferrous sulfate  325 mg Oral Q M,W,F   spironolactone   25 mg Oral Daily   And   hydrochlorothiazide  25 mg Oral Daily   ketotifen  2 drop Both Eyes Daily   levothyroxine   200 mcg Oral QAC breakfast   pantoprazole   40 mg Oral Daily   sacubitril -valsartan   1 tablet Oral BID    PRN Meds: acetaminophen , ondansetron  (ZOFRAN ) IV  ALLERGIES Allergies  Allergen Reactions   Other Other (See Comments)    Pet dander, pollen -> eyes itchy/watery, sneezing   Shellfish Allergy Itching   Wound Dressing Adhesive Other (See Comments)    Blisters     SOCIAL HISTORY  Social History   Tobacco Use   Smoking status: Former    Current packs/day: 0.00    Types: Cigarettes    Quit date: 1996    Years since quitting: 29.8   Smokeless tobacco: Never  Substance Use Topics   Alcohol use: Yes    Alcohol/week: 2.0 standard drinks of alcohol    Types: 2 Glasses of wine per week    Comment: social    FAMILY HISTORY   The patient's family history includes Atrial fibrillation in his brother; Heart murmur in his brother; Stroke in his father; Valvular heart disease in his mother. There is no history of Cancer - Colon.  PHYSICAL EXAM & DATA  Vitals:   10/21/24 0600 10/21/24 0700 10/21/24 0748 10/21/24 0755  BP: (!) 114/90 (!) 111/96  (!) 148/100  Pulse: (!) 56 (!) 59  (!) 56  Resp: 11 12  18   Temp:    97.8 F (36.6 C)  TempSrc:    Oral  SpO2: 99% 100%  100%  Weight:   100.8 kg   Height:   5' 11 (1.803 m)    No intake or output data in the 24 hours ending 10/21/24 1020 Filed Weights   10/20/24 1453 10/21/24 0748  Weight: 99.8 kg 100.8 kg   Body mass index is 30.99 kg/m.   Gen: Patient appears comfortable at rest. HEENT: Conjunctiva and lids normal, oropharynx clear. Neck: Supple, no elevated JVP or carotid bruits. Lungs: Clear to auscultation, nonlabored breathing at rest. Cardiac: Regular rate and rhythm, no S3 or significant systolic murmur, no pericardial rub. Abdomen: Soft,  nontender, bowel sounds present. Extremities: No pitting edema, distal pulses 2+. Skin: Warm and dry. Musculoskeletal: No kyphosis. Neuropsychiatric: Alert and oriented x3, affect grossly appropriate.  Telemetry:  I personally reviewed telemetry which shows sinus rhythm.  RELEVANT CV STUDIES  Echocardiogram 06/15/2024:  1. Left ventricular ejection fraction, by estimation, is 50%. The left  ventricle has mildly decreased function. The left ventricle demonstrates  global hypokinesis. The left ventricular internal cavity size was mildly  dilated. Left ventricular diastolic  parameters are consistent with Grade I diastolic dysfunction (impaired  relaxation). The average left ventricular global longitudinal strain is  -14.4 %. The global longitudinal strain is abnormal.   2. Right ventricular systolic function is normal. The right ventricular  size is normal. Tricuspid regurgitation signal is inadequate for assessing  PA pressure.  3. The mitral valve is normal in structure. Trivial mitral valve  regurgitation. No evidence of mitral stenosis.   4. The aortic valve is tricuspid. Aortic valve regurgitation is trivial.  No aortic stenosis is present.   5. The inferior vena cava is normal in size with greater than 50%  respiratory variability, suggesting right atrial pressure of 3 mmHg.   LABORATORY DATA  Chemistry Recent Labs  Lab 10/20/24 1618 10/21/24 0920  NA 138  --   K 3.8  --   CL 101  --   CO2 26  --   GLUCOSE 101*  --   BUN 16  --   CREATININE 1.04 1.02  CALCIUM 9.6  --   GFRNONAA >60 >60  ANIONGAP 11  --      Hematology Recent Labs  Lab 10/20/24 1618 10/21/24 0920  WBC 4.2 3.9*  RBC 6.03* 6.17*  HGB 13.7 14.2  HCT 44.9 45.8  MCV 74.5* 74.2*  MCH 22.7* 23.0*  MCHC 30.5 31.0  RDW 17.3* 17.2*  PLT 219 208   BNP Recent Labs  Lab 10/20/24 1618  PROBNP 90.6     Lipid Panel     Component Value Date/Time   CHOL 268 (H) 10/21/2024 0920   TRIG 176 (H)  10/21/2024 0920   HDL 130 10/21/2024 0920   CHOLHDL 2.1 10/21/2024 0920   VLDL 35 10/21/2024 0920   LDLCALC 103 (H) 10/21/2024 0920    RADIOLOGY/STUDIES MR BRAIN WO CONTRAST Result Date: 10/21/2024 CLINICAL DATA:  Dizziness EXAM: MRI HEAD WITHOUT CONTRAST TECHNIQUE: Multiplanar, multiecho pulse sequences of the brain and surrounding structures were obtained without intravenous contrast. COMPARISON:  None Available. FINDINGS: MRI brain: The brain volume is normal. The signal in the brain parenchyma is normal. There is no acute or chronic infarct. The ventricles are normal. No mass lesion. There are normal flow signals in the carotid arteries and basilar artery. No significant bone marrow signal abnormality. No significant abnormality in the paranasal sinuses or soft tissues. IMPRESSION: Normal Electronically Signed   By: Nancyann Burns M.D.   On: 10/21/2024 09:42   DG Chest 2 View Result Date: 10/20/2024 EXAM: 2 VIEW(S) XRAY OF THE CHEST 10/20/2024 03:18:00 PM COMPARISON: Chest x-ray 10/23/2023. CLINICAL HISTORY: CP. FINDINGS: LUNGS AND PLEURA: No focal pulmonary opacity. No pulmonary edema. No pleural effusion. No pneumothorax. HEART AND MEDIASTINUM: No acute abnormality of the cardiac and mediastinal silhouettes. BONES AND SOFT TISSUES: No acute osseous abnormality. IMPRESSION: 1. No acute process. Electronically signed by: Greig Pique MD 10/20/2024 03:50 PM EST RP Workstation: [id number]    ASSESSMENT & PLAN  1.  Recent onset chest pain at rest as discussed above.  High-sensitivity troponin T levels are not consistent with ACS, pro-BNP normal as well.  ECG does show nonspecific ST segment changes that are more prominent in comparison to tracing in June.  He has no history of obstructive CAD with reassuring cardiac catheterization in January 2024 however.  Known nonischemic cardiomyopathy by workup last year with most recent LVEF 50% as of June.  He reports compliance with his medications.  2.   HFimpEF with history of nonischemic, LVEF 50% and RV contraction normal by echocardiogram in June.  Outpatient cardiac regimen includes Jardiance  10 mg daily, Aldactone  25 mg daily, Entresto  24/26 mg twice daily, and Toprol -XL 50 mg daily.  Appears euvolemic on examination.  3.  Frequent PVCs, as high as 10% by cardiac monitoring over time, although down to 1.1% by most recent cardiac monitor in  June.  He is on Toprol -XL 50 mg daily.  No recent palpitations or sudden syncope.  Discussed with patient as well as his wife and daughter present.  I also communicated with his son (ER physician in [place] ) by phone regarding cardiac workup and overall status.  Plan is to update echocardiogram today to ensure no decline in LVEF.  Ischemic etiology not suspected to be of high likelihood at this time, however will continue to monitor on current medical regimen, he has been continued on his regular outpatient cardiovascular medications.  For questions or updates, please contact [facility] Please consult [email or web address] for contact info under   Signed, Jayson Sierras, MD  10/21/2024 10:20 AM

## 2024-10-21 NOTE — H&P (Signed)
 History and Physical  [facility]  Elijah Perez. FMW:[id number] DOB: 05/02/1966 DOA: 10/20/2024  PCP: [facility]  Patient coming from: Home Level of care: Telemetry  I have personally briefly reviewed patient's old medical records in [facility]  Chief Complaint: chest pain   HPI: Elijah Perez. is a 58 y.o. 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  Symptoms began around 2 PM.  He is reporting a pressure sensation of his left chest with a single episode of sharp pain that radiated down his left arm.  Pain was associated with some nausea and diaphoresis as well.  He took antacid and Pepto-Bismol without relief of his symptoms.  Pain has somewhat improved although not resolved prior to ER arrival.  He was given aspirin  upon arrival.  He denies back pain, vomiting, shortness of breath.  Cardiology consulted in the ED and had recommended observation.    He is followed by cardiology in [place].  Had echo in June that showed LV EF of 50%.  This is improved from echo from May 2025 with LVEF of 40 to 45%    Past Medical History:  Diagnosis Date   Cancer (HCC)    thyroid   CHF (congestive heart failure) (HCC)    GERD (gastroesophageal reflux disease)    Gout    HFrEF (heart failure with reduced ejection fraction) (HCC)    a. EF 30-35% in 12/2022 with cath showing no evidence of CAD   Horner's syndrome    Hypothyroidism    Thalassemia trait     Past Surgical History:  Procedure Laterality Date   COLONOSCOPY WITH PROPOFOL  N/A 09/17/2023   Procedure: COLONOSCOPY WITH PROPOFOL ;  Surgeon: Eartha Angelia Sieving, MD;  [facility];  Service: Gastroenterology;  Laterality: N/A;  11:30am, asa 3   EYE SURGERY      NASOPHARYNGOSCOPY EUSTATION TUBE BALLOON DILATION Bilateral 06/24/2024   Procedure: NASOPHARYNGOSCOPY, WITH EUSTACHIAN TUBE DILATION USING BALLOON;  Surgeon: Luciano Standing, MD;  Location:  [facility];  Service: ENT;  Laterality: Bilateral;  BILATERAL EUSTACHIAN TUBE BALLOON DILATION   RIGHT/LEFT HEART CATH AND CORONARY ANGIOGRAPHY N/A 01/03/2023   Procedure: RIGHT/LEFT HEART CATH AND CORONARY ANGIOGRAPHY;  Surgeon: Verlin Lonni BIRCH, MD;  [facility];  Service: Cardiovascular;  Laterality: N/A;   THYROIDECTOMY Bilateral      reports that he quit smoking about 29 years ago. His smoking use included cigarettes. He has never used smokeless tobacco. He reports current alcohol use of about 2.0 standard drinks of alcohol per week. He reports that he does not currently use drugs.  Allergies  Allergen Reactions   Other Other (See Comments)    Pet dander, pollen -> eyes itchy/watery, sneezing   Shellfish Allergy Itching   Wound Dressing Adhesive Other (See Comments)    Blisters     Family History  Problem Relation Age of Onset   Valvular heart disease Mother    Stroke Father    Heart murmur Brother    Atrial fibrillation Brother    Cancer - Colon Neg Hx     Prior to Admission medications   Medication Sig Start Date End Date Taking? Authorizing Provider  allopurinol  (ZYLOPRIM ) 100 MG tablet Take 100 mg by mouth daily. 11/26/19  Yes [provider]  empagliflozin  (JARDIANCE ) 10 MG TABS tablet Take 1 tablet (10 mg total) by mouth daily before breakfast. 03/05/24  Yes Sabharwal, Aditya, DO  eplerenone (INSPRA) 25 MG tablet Take 25 mg by mouth daily. 06/09/24  Yes [provider]  ferrous sulfate 325 (65 FE) MG EC tablet Take 325 mg by mouth every Monday, Wednesday, and Friday.   Yes [provider]  ketotifen (ZADITOR) 0.035 % ophthalmic solution Place 2 drops into both eyes daily. 12/23/23  Yes [provider]  levothyroxine   (SYNTHROID ) 200 MCG tablet Take 200 mcg by mouth daily before breakfast.   Yes [provider]  metoprolol  succinate (TOPROL  XL) 50 MG 24 hr tablet Take 1 tablet (50 mg total) by mouth at bedtime. 10/05/24  Yes BranchDorn FALCON, MD  omeprazole (PRILOSEC) 20 MG capsule Take 20 mg by mouth daily.   Yes [provider]  sacubitril -valsartan  (ENTRESTO ) 24-26 MG Take 1 tablet by mouth 2 (two) times daily. 06/01/24  Yes Sabharwal, Aditya, DO  spironolactone  (ALDACTONE ) 25 MG tablet Take 1 tablet (25 mg total) by mouth daily. 03/04/24  Yes Gardenia Led, DO    Physical Exam: Vitals:   10/21/24 0532 10/21/24 0600 10/21/24 0700 10/21/24 0748  BP:  (!) 114/90 (!) 111/96   Pulse:  (!) 56 (!) 59   Resp:  11 12   Temp: 98 F (36.7 C)     TempSrc: Oral     SpO2:  99% 100%   Weight:    100.8 kg  Height:    5' 11 (1.803 m)    Constitutional: NAD, calm, comfortable Eyes: PERRL, lids and conjunctivae normal ENMT: Mucous membranes are moist. Posterior pharynx clear of any exudate or lesions.Normal dentition.  Neck: normal, supple, no masses, no thyromegaly Respiratory: clear to auscultation bilaterally, no wheezing, no crackles. Normal respiratory effort. No accessory muscle use.  Cardiovascular: normal s1, s2 sounds, no murmurs / rubs / gallops. No extremity edema. 2+ pedal pulses. No carotid bruits.  Abdomen: no tenderness, no masses palpated. No hepatosplenomegaly. Bowel sounds positive.  Musculoskeletal: no clubbing / cyanosis. No joint deformity upper and lower extremities. Good ROM, no contractures. Normal muscle tone.  Skin: no rashes, lesions, ulcers. No induration Neurologic: CN 2-12 grossly intact. Sensation intact, DTR normal. Strength 5/5 in all 4.  Psychiatric: Normal judgment and insight. Alert and oriented x 3. Normal mood.   Labs on Admission: I have personally reviewed following labs and imaging studies  CBC: Recent Labs  Lab 10/20/24 1618  WBC 4.2  HGB  13.7  HCT 44.9  MCV 74.5*  PLT 219   Basic Metabolic Panel: Recent Labs  Lab 10/20/24 1618 10/20/24 1925  NA 138  --   K 3.8  --   CL 101  --   CO2 26  --   GLUCOSE 101*  --   BUN 16  --   CREATININE 1.04  --   CALCIUM 9.6  --   MG  --  2.2   GFR: Estimated Creatinine Clearance: 95.9 mL/min (by C-G formula based on SCr of 1.04 mg/dL). Liver Function Tests: No results for input(s): AST, ALT, ALKPHOS, BILITOT, PROT, ALBUMIN in the last 168 hours. No results for input(s): LIPASE, AMYLASE in the last 168 hours. No results for input(s): AMMONIA in the last 168 hours. Coagulation Profile: No results for input(s): INR, PROTIME in the last 168 hours. Cardiac Enzymes: No results for input(s): CKTOTAL, CKMB, CKMBINDEX, TROPONINI in the last 168 hours. BNP (  last 3 results) Recent Labs    10/20/24 1618  PROBNP 90.6   HbA1C: No results for input(s): HGBA1C in the last 72 hours. CBG: No results for input(s): GLUCAP in the last 168 hours. Lipid Profile: No results for input(s): CHOL, HDL, LDLCALC, TRIG, CHOLHDL, LDLDIRECT in the last 72 hours. Thyroid Function Tests: No results for input(s): TSH, T4TOTAL, FREET4, T3FREE, THYROIDAB in the last 72 hours. Anemia Panel: No results for input(s): VITAMINB12, FOLATE, FERRITIN, TIBC, IRON, RETICCTPCT in the last 72 hours. Urine analysis: No results found for: COLORURINE, APPEARANCEUR, LABSPEC, PHURINE, GLUCOSEU, HGBUR, BILIRUBINUR, KETONESUR, PROTEINUR, UROBILINOGEN, NITRITE, LEUKOCYTESUR  Radiological Exams on Admission: DG Chest 2 View Result Date: 10/20/2024 EXAM: 2 VIEW(S) XRAY OF THE CHEST 10/20/2024 03:18:00 PM COMPARISON: Chest x-ray 10/23/2023. CLINICAL HISTORY: CP. FINDINGS: LUNGS AND PLEURA: No focal pulmonary opacity. No pulmonary edema. No pleural effusion. No pneumothorax. HEART AND MEDIASTINUM: No acute abnormality of the cardiac and  mediastinal silhouettes. BONES AND SOFT TISSUES: No acute osseous abnormality. IMPRESSION: 1. No acute process. Electronically signed by: Greig Pique MD 10/20/2024 03:50 PM EST RP Workstation: [id number]    EKG: Independently reviewed. Nonspecific ST-T wave findings  Assessment/Plan Principal Problem:   Chest pain Active Problems:   GERD (gastroesophageal reflux disease)   Acquired hypothyroidism   Non-ischemic cardiomyopathy (HCC)   Nonspecific chest pain symptoms -- pt is placed in observation for serial HS troponin tests -- HS troponin tests have been reassuring and ACS appears ruled out -- follow up cardiology team recommendations for need of further testing -- treating GERD aggressively  GERD -- pantoprazole  ordered for GI protection   Essential hypertension -- BPs have improved after restarting his home cardiac medications  NICM -- resumed all home cardiac medications  Gout -- resume home allopurinol    DVT prophylaxis: enoxaparin    Code Status: Full   Family Communication:   Disposition Plan:   Consults called: cardiology   Admission status: OBV Time spent: 53 mins   Level of care: Telemetry Afton Louder MD [facility] How to contact the [facility] Attending or Consulting provider 7A - 7P or covering provider during after hours 7P -7A, for this patient?  Check the care team in [facility] and look for a) attending/consulting [facility] provider listed and b) the [facility] team listed Log into [email or web address] and use [place]'s universal password to access. If you do not have the password, please contact the hospital operator. Locate the [facility] provider you are looking for under [facility] and page to a number that you can be directly reached. If you still have difficulty reaching the provider, please page the [facility] (Director on Call) for the Hospitalists listed on amion for assistance.   If 7PM-7AM, please contact night-coverage [email or web address] Password [facility]  10/21/2024,  7:49 AM

## 2024-10-21 NOTE — Hospital Course (Addendum)
 58 y.o. 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  Symptoms began around 2 PM.  He is reporting a pressure sensation of his left chest with a single episode of sharp pain that radiated down his left arm.  Pain was associated with some nausea and diaphoresis as well.  He took antacid and Pepto-Bismol without relief of his symptoms.  Pain has somewhat improved although not resolved prior to ER arrival.  He was given aspirin  upon arrival.  He denies back pain, vomiting, shortness of breath.  Cardiology consulted in the ED and had recommended observation.    He is followed by cardiology in [place].  Had echo in June that showed LV EF of 50%.  This is improved from echo from May 2025 with LVEF of 40 to 45%

## 2024-10-21 NOTE — TOC CM/SW Note (Signed)
 Transition of Care [facility]) - Inpatient Brief Assessment   Patient Details  Name: Elijah Perez. MRN: [id number] Date of Birth: 10-18-1966  [facility] CM/SW Contact:    Lucie Lunger, LCSWA Phone Number: 10/21/2024, 10:25 AM   Clinical Narrative: [facility] notified [facility] hospital admission. [facility] notification ID is [phone].   Transition of Care Department [facility]) has reviewed patient and no TOC needs have been identified at this time. We will continue to monitor patient advancement through interdiciplinary progression rounds. If new patient transition needs arise, please place a TOC consult.  Transition of Care Asessment: Insurance and Status: Insurance coverage has been reviewed Patient has primary care physician: Yes Home environment has been reviewed: From home Prior level of function:: Independent Prior/Current Home Services: No current home services Social Drivers of Health Review: SDOH reviewed no interventions necessary Readmission risk has been reviewed: Yes Transition of care needs: no transition of care needs at this time

## 2024-10-21 NOTE — ED Notes (Signed)
 ED TO INPATIENT HANDOFF REPORT  ED Nurse Name and Phone #: Warren BECKER RN [phone]  S Name/Age/Gender Elijah Perez. 58 y.o. male Room/Bed: APA03/APA03  Code Status   Code Status: Prior  Home/SNF/Other Home Patient oriented to: self, place, time, and situation Is this baseline? Yes   Triage Complete: Triage complete  Chief Complaint Chest pain [R07.9]  Triage Note Pt with left sided CP while in his office, pt thought it was heart burn at first and took med for it.  Pt states pain went down his left arm.    Allergies Allergies  Allergen Reactions   Other Other (See Comments)    Pet dander, pollen -> eyes itchy/watery, sneezing   Shellfish Allergy Itching   Wound Dressing Adhesive Other (See Comments)    Blisters     Level of Care/Admitting Diagnosis ED Disposition     ED Disposition  Admit   Condition  --   Comment  Hospital Area: [facility] [[id number]]  Level of Care: Telemetry [5]  Diagnosis: Chest pain [255200]  Admitting Physician: ADEFESO, OLADAPO [[id number]]  Attending Physician: ADEFESO, OLADAPO [[id number]]          B Medical/Surgery History Past Medical History:  Diagnosis Date   Cancer (HCC)    thyroid   CHF (congestive heart failure) (HCC)    GERD (gastroesophageal reflux disease)    Gout    HFrEF (heart failure with reduced ejection fraction) (HCC)    a. EF 30-35% in 12/2022 with cath showing no evidence of CAD   Horner's syndrome    Hypothyroidism    Thalassemia trait    Past Surgical History:  Procedure Laterality Date   COLONOSCOPY WITH PROPOFOL  N/A 09/17/2023   Procedure: COLONOSCOPY WITH PROPOFOL ;  Surgeon: Eartha Angelia Sieving, MD;  [facility];  Service: Gastroenterology;  Laterality: N/A;  11:30am, asa 3   EYE SURGERY     NASOPHARYNGOSCOPY EUSTATION TUBE BALLOON DILATION Bilateral 06/24/2024   Procedure: NASOPHARYNGOSCOPY, WITH EUSTACHIAN TUBE DILATION USING BALLOON;  Surgeon: Luciano Standing, MD;   Location: [facility];  Service: ENT;  Laterality: Bilateral;  BILATERAL EUSTACHIAN TUBE BALLOON DILATION   RIGHT/LEFT HEART CATH AND CORONARY ANGIOGRAPHY N/A 01/03/2023   Procedure: RIGHT/LEFT HEART CATH AND CORONARY ANGIOGRAPHY;  Surgeon: Verlin Lonni BIRCH, MD;  Location: [facility];  Service: Cardiovascular;  Laterality: N/A;   THYROIDECTOMY Bilateral      A IV Location/Drains/Wounds Patient Lines/Drains/Airways Status     Active Line/Drains/Airways     Name Placement date Placement time Site Days   Peripheral IV 10/20/24 20 G Right Antecubital 10/20/24  2032  Antecubital  1            Intake/Output Last 24 hours No intake or output data in the 24 hours ending 10/21/24 0707  Labs/Imaging Results for orders placed or performed during the hospital encounter of 10/20/24 (from the past 48 hours)  Basic metabolic panel     Status: Abnormal   Collection Time: 10/20/24  4:18 PM  Result Value Ref Range   Sodium 138 135 - 145 mmol/L   Potassium 3.8 3.5 - 5.1 mmol/L   Chloride 101 98 - 111 mmol/L   CO2 26 22 - 32 mmol/L   Glucose, Bld 101 (H) 70 - 99 mg/dL    Comment: Glucose reference range applies only to samples taken after fasting for at least 8 hours.   BUN 16 6 - 20 mg/dL   Creatinine, Ser 8.95 0.61 -  1.24 mg/dL   Calcium 9.6 8.9 - 89.6 mg/dL   GFR, Estimated >39 >39 mL/min    Comment: (NOTE) Calculated using the CKD-EPI Creatinine Equation (2021)    Anion gap 11 5 - 15    Comment: Performed at [facility], [address], [place], [place] [address]  CBC     Status: Abnormal   Collection Time: 10/20/24  4:18 PM  Result Value Ref Range   WBC 4.2 4.0 - 10.5 K/uL   RBC 6.03 (H) 4.22 - 5.81 MIL/uL   Hemoglobin 13.7 13.0 - 17.0 g/dL   HCT 55.0 60.9 - 47.9 %   MCV 74.5 (L) 80.0 - 100.0 fL   MCH 22.7 (L) 26.0 - 34.0 pg   MCHC 30.5 30.0 - 36.0 g/dL   RDW 82.6 (H) 88.4 - 84.4 %   Platelets 219 150 - 400 K/uL   nRBC 0.0 0.0 - 0.2 %    Comment:  Performed at [facility], [address], [place], [place] [address]  Troponin T, High Sensitivity     Status: None   Collection Time: 10/20/24  4:18 PM  Result Value Ref Range   Troponin T High Sensitivity 18 0 - 19 ng/L    Comment: (NOTE) Biotin concentrations > 1000 ng/mL falsely decrease TnT results.  Serial cardiac troponin measurements are suggested.  Refer to the Links section for chest pain algorithms and additional  guidance. Performed at [facility], [address], [place], [place] [address]   Pro Brain natriuretic peptide     Status: None   Collection Time: 10/20/24  4:18 PM  Result Value Ref Range   Pro Brain Natriuretic Peptide 90.6 <300.0 pg/mL    Comment: (NOTE) Age Group        Cut-Points    Interpretation  < 50 years     450 pg/mL       NT-proBNP > 450 pg/mL indicates                                ADHF is likely              50 to 75 years  900 pg/mL      NT-proBNP > 900 pg/mL indicates          ADHF is likely  > 75 years      1800 pg/mL     NT-proBNP > 1800 pg/mL indicates          ADHF is likely                           All ages    Results between       Indeterminate. Further clinical             300 and the cut-   information is needed to determine            point for age group   if ADHF is present.                                                             Elecsys proBNP II/ Elecsys proBNP II STAT  Cut-Point                       Interpretation  300 pg/mL                    NT-proBNP <300pg/mL indicates                             ADHF is not likely  Performed at [facility], [address] [place], [place] [address]   Troponin T, High Sensitivity     Status: Abnormal   Collection Time: 10/20/24  7:25 PM  Result Value Ref Range   Troponin T High Sensitivity 22 (H) 0 - 19 ng/L    Comment: (NOTE) Biotin concentrations > 1000 ng/mL falsely decrease TnT results.  Serial cardiac troponin measurements are suggested.  Refer to the Links  section for chest pain algorithms and additional  guidance. Performed at [facility], [address] [place], [place] [address]   Magnesium     Status: None   Collection Time: 10/20/24  7:25 PM  Result Value Ref Range   Magnesium 2.2 1.7 - 2.4 mg/dL    Comment: Performed at [facility], [address] [place], [place] [address]  Troponin T, High Sensitivity     Status: Abnormal   Collection Time: 10/20/24 10:50 PM  Result Value Ref Range   Troponin T High Sensitivity 22 (H) 0 - 19 ng/L    Comment: (NOTE) Biotin concentrations > 1000 ng/mL falsely decrease TnT results.  Serial cardiac troponin measurements are suggested.  Refer to the Links section for chest pain algorithms and additional  guidance. Performed at [facility], [address], [place], [place] [address]    DG Chest 2 View Result Date: 10/20/2024 EXAM: 2 VIEW(S) XRAY OF THE CHEST 10/20/2024 03:18:00 PM COMPARISON: Chest x-ray 10/23/2023. CLINICAL HISTORY: CP. FINDINGS: LUNGS AND PLEURA: No focal pulmonary opacity. No pulmonary edema. No pleural effusion. No pneumothorax. HEART AND MEDIASTINUM: No acute abnormality of the cardiac and mediastinal silhouettes. BONES AND SOFT TISSUES: No acute osseous abnormality. IMPRESSION: 1. No acute process. Electronically signed by: Greig Pique MD 10/20/2024 03:50 PM EST RP Workstation: [id number]    Pending Labs Unresulted Labs (From admission, onward)    None       Vitals/Pain Today's Vitals   10/21/24 0400 10/21/24 0500 10/21/24 0532 10/21/24 0600  BP: 121/87 125/77  (!) 114/90  Pulse: (!) 54 (!) 51  (!) 56  Resp: 10 11  11   Temp:   98 F (36.7 C)   TempSrc:   Oral   SpO2: 99% 100%  99%  Weight:      Height:      PainSc:        Isolation Precautions No active isolations  Medications Medications  sacubitril -valsartan  (ENTRESTO ) 24-26 mg per tablet (1 tablet Oral Given 10/20/24 2308)  spironolactone  (ALDACTONE ) tablet 25 mg (has no administration in time range)     And  hydrochlorothiazide (HYDRODIURIL) tablet 25 mg (has no administration in time range)  aspirin  chewable tablet 324 mg (324 mg Oral Given 10/20/24 1626)  morphine (PF) 4 MG/ML injection 4 mg (4 mg Intravenous Given 10/20/24 2032)  ondansetron  (ZOFRAN ) injection 4 mg (4 mg Intravenous Given 10/20/24 2032)  alum & mag hydroxide-simeth (MAALOX/MYLANTA) 200-200-20 MG/5ML suspension 30 mL (30 mLs Oral Given 10/20/24 2112)  metoprolol  succinate (TOPROL -XL) 24 hr tablet 50 mg (50 mg Oral Given 10/20/24  2308)    Mobility walks     Focused Assessments Cardiac Assessment Handoff:  Cardiac Rhythm: Normal sinus rhythm No results found for: CKTOTAL, CKMB, CKMBINDEX, TROPONINI Lab Results  Component Value Date   DDIMER 0.68 (H) 01/01/2023   Does the Patient currently have chest pain? No    R Recommendations: See Admitting Provider Note  Report given to:   Additional Notes:

## 2024-10-21 NOTE — Plan of Care (Signed)

## 2024-10-22 ENCOUNTER — Encounter (HOSPITAL_COMMUNITY)
Admission: EM | Disposition: A | Discharge status: Home / Self Care | Payer: Self-pay | Source: Home / Self Care | Attending: Family Medicine

## 2024-10-22 ENCOUNTER — Encounter (HOSPITAL_COMMUNITY): Payer: Self-pay | Admitting: Cardiology

## 2024-10-22 DIAGNOSIS — I493 Ventricular premature depolarization: Secondary | ICD-10-CM | POA: Diagnosis present

## 2024-10-22 DIAGNOSIS — E039 Hypothyroidism, unspecified: Secondary | ICD-10-CM

## 2024-10-22 DIAGNOSIS — Z79890 Hormone replacement therapy (postmenopausal): Secondary | ICD-10-CM | POA: Diagnosis not present

## 2024-10-22 DIAGNOSIS — G902 Horner's syndrome: Secondary | ICD-10-CM | POA: Diagnosis present

## 2024-10-22 DIAGNOSIS — R079 Chest pain, unspecified: Secondary | ICD-10-CM

## 2024-10-22 DIAGNOSIS — I472 Ventricular tachycardia, unspecified: Secondary | ICD-10-CM | POA: Diagnosis present

## 2024-10-22 DIAGNOSIS — M109 Gout, unspecified: Secondary | ICD-10-CM | POA: Diagnosis present

## 2024-10-22 DIAGNOSIS — K219 Gastro-esophageal reflux disease without esophagitis: Secondary | ICD-10-CM

## 2024-10-22 DIAGNOSIS — I428 Other cardiomyopathies: Secondary | ICD-10-CM | POA: Diagnosis present

## 2024-10-22 DIAGNOSIS — N179 Acute kidney failure, unspecified: Secondary | ICD-10-CM | POA: Diagnosis not present

## 2024-10-22 DIAGNOSIS — Z79899 Other long term (current) drug therapy: Secondary | ICD-10-CM | POA: Diagnosis not present

## 2024-10-22 DIAGNOSIS — I25118 Atherosclerotic heart disease of native coronary artery with other forms of angina pectoris: Secondary | ICD-10-CM | POA: Diagnosis present

## 2024-10-22 DIAGNOSIS — E66811 Obesity, class 1: Secondary | ICD-10-CM | POA: Diagnosis present

## 2024-10-22 DIAGNOSIS — Z91013 Allergy to seafood: Secondary | ICD-10-CM | POA: Diagnosis not present

## 2024-10-22 DIAGNOSIS — I77810 Thoracic aortic ectasia: Secondary | ICD-10-CM | POA: Diagnosis present

## 2024-10-22 DIAGNOSIS — E890 Postprocedural hypothyroidism: Secondary | ICD-10-CM | POA: Diagnosis present

## 2024-10-22 DIAGNOSIS — I4510 Unspecified right bundle-branch block: Secondary | ICD-10-CM | POA: Diagnosis present

## 2024-10-22 DIAGNOSIS — I5032 Chronic diastolic (congestive) heart failure: Secondary | ICD-10-CM

## 2024-10-22 DIAGNOSIS — Z8249 Family history of ischemic heart disease and other diseases of the circulatory system: Secondary | ICD-10-CM | POA: Diagnosis not present

## 2024-10-22 DIAGNOSIS — I110 Hypertensive heart disease with heart failure: Secondary | ICD-10-CM | POA: Diagnosis present

## 2024-10-22 DIAGNOSIS — I4729 Other ventricular tachycardia: Secondary | ICD-10-CM | POA: Diagnosis not present

## 2024-10-22 DIAGNOSIS — I340 Nonrheumatic mitral (valve) insufficiency: Secondary | ICD-10-CM | POA: Diagnosis present

## 2024-10-22 DIAGNOSIS — Z95810 Presence of automatic (implantable) cardiac defibrillator: Secondary | ICD-10-CM | POA: Diagnosis not present

## 2024-10-22 DIAGNOSIS — Z923 Personal history of irradiation: Secondary | ICD-10-CM | POA: Diagnosis not present

## 2024-10-22 DIAGNOSIS — G4733 Obstructive sleep apnea (adult) (pediatric): Secondary | ICD-10-CM | POA: Diagnosis present

## 2024-10-22 DIAGNOSIS — Z87891 Personal history of nicotine dependence: Secondary | ICD-10-CM | POA: Diagnosis not present

## 2024-10-22 DIAGNOSIS — K5900 Constipation, unspecified: Secondary | ICD-10-CM | POA: Diagnosis present

## 2024-10-22 DIAGNOSIS — D563 Thalassemia minor: Secondary | ICD-10-CM | POA: Diagnosis present

## 2024-10-22 HISTORY — PX: LEFT HEART CATH AND CORONARY ANGIOGRAPHY: CATH118249

## 2024-10-22 LAB — MRSA NEXT GEN BY PCR, NASAL: MRSA by PCR Next Gen: NOT DETECTED

## 2024-10-22 LAB — CREATININE, SERUM
Creatinine, Ser: 1.48 mg/dL — ABNORMAL HIGH (ref 0.61–1.24)
GFR, Estimated: 55 mL/min — ABNORMAL LOW (ref >60)

## 2024-10-22 LAB — BASIC METABOLIC PANEL WITH GFR
Anion gap: 14 (ref 5–15)
BUN: 14 mg/dL (ref 6–20)
CO2: 24 mmol/L (ref 22–32)
Calcium: 9.8 mg/dL (ref 8.9–10.3)
Chloride: 97 mmol/L — ABNORMAL LOW (ref 98–111)
Creatinine, Ser: 1.17 mg/dL (ref 0.61–1.24)
GFR, Estimated: >60 mL/min (ref >60)
Glucose, Bld: 166 mg/dL — ABNORMAL HIGH (ref 70–99)
Potassium: 3.5 mmol/L (ref 3.5–5.1)
Sodium: 135 mmol/L (ref 135–145)

## 2024-10-22 LAB — CBC
HCT: 48.1 % (ref 39.0–52.0)
Hemoglobin: 15 g/dL (ref 13.0–17.0)
MCH: 22.8 pg — ABNORMAL LOW (ref 26.0–34.0)
MCHC: 31.2 g/dL (ref 30.0–36.0)
MCV: 73.1 fL — ABNORMAL LOW (ref 80.0–100.0)
Platelets: 205 K/uL (ref 150–400)
RBC: 6.58 MIL/uL — ABNORMAL HIGH (ref 4.22–5.81)
RDW: 17.3 % — ABNORMAL HIGH (ref 11.5–15.5)
WBC: 4.9 K/uL (ref 4.0–10.5)
nRBC: 0 % (ref 0.0–0.2)

## 2024-10-22 LAB — TROPONIN T, HIGH SENSITIVITY
Troponin T High Sensitivity: 21 ng/L — ABNORMAL HIGH (ref 0–19)
Troponin T High Sensitivity: 21 ng/L — ABNORMAL HIGH (ref 0–19)

## 2024-10-22 LAB — MAGNESIUM: Magnesium: 2.2 mg/dL (ref 1.7–2.4)

## 2024-10-22 LAB — TSH: TSH: 0.538 u[IU]/mL (ref 0.350–4.500)

## 2024-10-22 MED ORDER — ALPRAZOLAM 0.25 MG PO TABS
0.2500 mg | ORAL_TABLET | Freq: Three times a day (TID) | ORAL | Status: DC | PRN
Start: 2024-10-22 — End: 2024-10-25
  Administered 2024-10-22 – 2024-10-23 (×2): 0.25 mg via ORAL
  Filled 2024-10-22 (×4): qty 1

## 2024-10-22 MED ORDER — FREE WATER: 250.0000 mL | Freq: Once | Status: DC

## 2024-10-22 MED ORDER — SODIUM CHLORIDE 0.9% FLUSH: 3.0000 mL | INTRAVENOUS | Status: DC | PRN

## 2024-10-22 MED ORDER — AMIODARONE HCL IN DEXTROSE 360-4.14 MG/200ML-% IV SOLN
30.0000 mg/h | INTRAVENOUS | Status: DC
  Administered 2024-10-22 – 2024-10-24 (×4): 30 mg/h via INTRAVENOUS
  Filled 2024-10-22 (×4): qty 200

## 2024-10-22 MED ORDER — POTASSIUM CHLORIDE CRYS ER 20 MEQ PO TBCR
60.0000 meq | EXTENDED_RELEASE_TABLET | Freq: Once | ORAL | Status: AC
  Administered 2024-10-22: 60 meq via ORAL
  Filled 2024-10-22: qty 3

## 2024-10-22 MED ORDER — IOHEXOL 350 MG/ML SOLN
INTRAVENOUS | Status: DC | PRN
  Administered 2024-10-22: 40 mL

## 2024-10-22 MED ORDER — ASPIRIN 81 MG PO CHEW: 81.0000 mg | CHEWABLE_TABLET | ORAL | Status: DC

## 2024-10-22 MED ORDER — SODIUM CHLORIDE 0.45 % IV BOLUS
INTRAVENOUS | Status: AC | PRN
  Administered 2024-10-22: 300 mL via INTRAVENOUS

## 2024-10-22 MED ORDER — SENNOSIDES-DOCUSATE SODIUM 8.6-50 MG PO TABS
1.0000 | ORAL_TABLET | Freq: Two times a day (BID) | ORAL | Status: DC
  Administered 2024-10-22 – 2024-10-25 (×7): 1 via ORAL
  Filled 2024-10-22 (×7): qty 1

## 2024-10-22 MED ORDER — AMIODARONE HCL IN DEXTROSE 360-4.14 MG/200ML-% IV SOLN
60.0000 mg/h | INTRAVENOUS | Status: DC
  Administered 2024-10-22 (×2): 60 mg/h via INTRAVENOUS
  Filled 2024-10-22: qty 200

## 2024-10-22 MED ORDER — FENTANYL CITRATE (PF) 100 MCG/2ML IJ SOLN
INTRAMUSCULAR | Status: DC | PRN
  Administered 2024-10-22: 25 ug via INTRAVENOUS

## 2024-10-22 MED ORDER — LIDOCAINE HCL (PF) 1 % IJ SOLN
INTRAMUSCULAR | Status: DC | PRN
  Administered 2024-10-22: 5 mL via INTRADERMAL

## 2024-10-22 MED ORDER — FENTANYL CITRATE (PF) 100 MCG/2ML IJ SOLN
INTRAMUSCULAR | Status: AC
  Filled 2024-10-22: qty 2

## 2024-10-22 MED ORDER — VERAPAMIL HCL 2.5 MG/ML IV SOLN
INTRAVENOUS | Status: AC
  Filled 2024-10-22: qty 2

## 2024-10-22 MED ORDER — AMIODARONE LOAD VIA INFUSION
150.0000 mg | Freq: Once | INTRAVENOUS | Status: AC
  Administered 2024-10-22: 150 mg via INTRAVENOUS

## 2024-10-22 MED ORDER — AMIODARONE LOAD VIA INFUSION
150.0000 mg | Freq: Once | INTRAVENOUS | Status: AC
  Administered 2024-10-22: 150 mg via INTRAVENOUS
  Filled 2024-10-22: qty 83.34

## 2024-10-22 MED ORDER — HEPARIN SODIUM (PORCINE) 1000 UNIT/ML IJ SOLN
INTRAMUSCULAR | Status: DC | PRN
  Administered 2024-10-22: 5000 [IU] via INTRAVENOUS

## 2024-10-22 MED ORDER — SODIUM CHLORIDE 0.9 % IV SOLN: 250.0000 mL | INTRAVENOUS | Status: AC | PRN

## 2024-10-22 MED ORDER — VERAPAMIL HCL 2.5 MG/ML IV SOLN
INTRAVENOUS | Status: DC | PRN
  Administered 2024-10-22: 10 mL via INTRA_ARTERIAL

## 2024-10-22 MED ORDER — FREE WATER
500.0000 mL | Freq: Once | Status: AC
  Administered 2024-10-22: 500 mL via ORAL

## 2024-10-22 MED ORDER — LIDOCAINE HCL (PF) 1 % IJ SOLN
INTRAMUSCULAR | Status: AC
  Filled 2024-10-22: qty 30

## 2024-10-22 MED ORDER — SODIUM CHLORIDE 0.9% FLUSH
3.0000 mL | Freq: Two times a day (BID) | INTRAVENOUS | Status: DC
  Administered 2024-10-22 – 2024-10-25 (×6): 3 mL via INTRAVENOUS

## 2024-10-22 MED ORDER — CHLORHEXIDINE GLUCONATE CLOTH 2 % EX PADS: 6.0000 | MEDICATED_PAD | Freq: Every day | CUTANEOUS | Status: DC

## 2024-10-22 MED ORDER — MIDAZOLAM HCL 2 MG/2ML IJ SOLN
INTRAMUSCULAR | Status: AC
  Filled 2024-10-22: qty 2

## 2024-10-22 MED ORDER — HEPARIN (PORCINE) IN NACL 1000-0.9 UT/500ML-% IV SOLN
INTRAVENOUS | Status: DC | PRN
  Administered 2024-10-22: 1000 mL

## 2024-10-22 MED ORDER — NITROGLYCERIN 0.4 MG SL SUBL
0.4000 mg | SUBLINGUAL_TABLET | SUBLINGUAL | Status: DC | PRN
  Administered 2024-10-22: 0.4 mg via SUBLINGUAL
  Filled 2024-10-22: qty 1

## 2024-10-22 MED ORDER — ASPIRIN 81 MG PO CHEW
81.0000 mg | CHEWABLE_TABLET | ORAL | Status: AC
  Administered 2024-10-22: 81 mg via ORAL
  Filled 2024-10-22: qty 1

## 2024-10-22 MED ORDER — METOPROLOL SUCCINATE ER 50 MG PO TB24
50.0000 mg | ORAL_TABLET | Freq: Every day | ORAL | Status: DC
Start: 2024-10-22 — End: 2024-10-22
  Administered 2024-10-22: 50 mg via ORAL
  Filled 2024-10-22: qty 1

## 2024-10-22 MED ORDER — ENOXAPARIN SODIUM 40 MG/0.4ML IJ SOSY: 40.0000 mg | PREFILLED_SYRINGE | INTRAMUSCULAR | Status: DC

## 2024-10-22 MED ORDER — HEPARIN SODIUM (PORCINE) 1000 UNIT/ML IJ SOLN
INTRAMUSCULAR | Status: AC
  Filled 2024-10-22: qty 10

## 2024-10-22 MED ORDER — MIDAZOLAM HCL (PF) 2 MG/2ML IJ SOLN
INTRAMUSCULAR | Status: DC | PRN
  Administered 2024-10-22: 1 mg via INTRAVENOUS

## 2024-10-22 NOTE — Progress Notes (Addendum)
 1020 patient arrived from 300 floor for chest pain and v-tach runs amio bolus given and gtt started patient alert x4 2L [place] placed on patient and 20g IV started in LFA.  1337 patient having frequent v runs 26 beats, 9 beats, then 7 beats patient complaining of chest pain nitroglycerin given new orders to re bolus amio completed   1425 care link called to get patient report for transport set up for patient to go to [place] straight to cath lab wife called and updated on plan of care, patient and his daughter that's at bedside updated on plan  1458 patient taken by care-link to [place] for cardiac cath daughter at bedside patient alert x4 on 2L [place] 81mg  ASA given per orders

## 2024-10-22 NOTE — Progress Notes (Addendum)
 Received call from CCMD that patient had a 44 beat run of vtac at 921. Upon making it to beside to assess patient, patient back into another run of vtach. Patient c/o palpations and feeling winded, denying any pain or pressure. BP 125/92. Dr. Vicci notified. Patient had an additional 2 runs of sustained vtach while RN in room. Vtach captured on 12 lead EKG. Dr. Alvan came to bedside to assess patient. New orders placed for transfer to SDU for amio initation. Patient transferred from 334 to ICU-2 without issue. Report given to receiving nurse Jovita at bedside. Wife and daughter updated on plan by Dr. Alvan.

## 2024-10-22 NOTE — Interval H&P Note (Signed)
 History and Physical Interval Note:  10/22/2024 3:45 PM  Elijah Perez [name].  has presented today for surgery, with the diagnosis of chest pain - vt.  The various methods of treatment have been discussed with the patient and family. After consideration of risks, benefits and other options for treatment, the patient has consented to  Procedure(s): LEFT HEART CATH AND CORONARY ANGIOGRAPHY (N/A) as a surgical intervention.  The patient's history has been reviewed, patient examined, no change in status, stable for surgery.  I have reviewed the patient's chart and labs.  Questions were answered to the patient's satisfaction.   Cath Lab Visit (complete for each Cath Lab visit)  Clinical Evaluation Leading to the Procedure:   ACS: Yes.    Non-ACS:    Anginal Classification: CCS II  Anti-ischemic medical therapy: Minimal Therapy (1 class of medications)  Non-Invasive Test Results: No non-invasive testing performed  Prior CABG: No previous CABG        Maude [facility] 10/22/2024 3:45 PM

## 2024-10-22 NOTE — Progress Notes (Addendum)
 PROGRESS NOTE   Elijah Perez Elijah Perez.  FMW:[id number] DOB: September 28, 1966 DOA: 10/20/2024 PCP: [facility]   Chief Complaint  Patient presents with   Chest Pain   Level of care: Stepdown  Brief Admission History:  58 y.o. 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  Symptoms began around 2 PM.  He is reporting a pressure sensation of his left chest with a single episode of sharp pain that radiated down his left arm.  Pain was associated with some nausea and diaphoresis as well.  He took antacid and Pepto-Bismol without relief of his symptoms.  Pain has somewhat improved although not resolved prior to ER arrival.  He was given aspirin  upon arrival.  He denies back pain, vomiting, shortness of breath.  Cardiology consulted in the ED and had recommended observation.    He is followed by cardiology in [place].  Had echo in June that showed LV EF of 50%.  This is improved from echo from May 2025 with LVEF of 40 to 45%   Assessment and Plan:  Nonspecific chest pain symptoms -- pt is placed in observation for serial HS troponin tests -- HS troponin tests have been reassuring and ACS appears ruled out -- follow up cardiology team recommendations for need of further testing -- treating GERD aggressively -- recurrent CP episode today associated with significant V tach -- discussed with Dr. Alvan, plan is to transfer to [facility] for cath tomorrow -- made NPO for cath tomorrow -- added alprazolam 0.25 mg TID PRN and added sublingual NTG 0.4 mg PRN   Recurrent runs of NSVT  -- discussed with Dr. Alvan, transferred to SDU and started on IV amiodarone infusion -- plan for repeat cath in progress for later today    GERD -- pantoprazole  ordered for GI  protection    Essential hypertension -- BPs have improved after restarting his home cardiac medications   NICM -- resumed all home cardiac medications -- LHC planned for tomorrow    Gout -- resume home allopurinol   Constipation -- peri-colace 1 po BID ordered   DVT prophylaxis: enoxaparin   Code Status: Full  Family Communication: wife, daughter at bedside  Disposition: anticipating transfer to [facility] for cath, on cardiology service, [facility] will not follow   Consultants:  Cardiology  Procedures:   Antimicrobials:    Subjective: Pt started having palpitations and SOB and intermittent episodes of V tach.   Objective: Vitals:   10/22/24 0941 10/22/24 1009 10/22/24 1015 10/22/24 1018  BP: (!) 119/93 (!) 143/73 (!) 134/96   Pulse: 73 78 75   Resp:  19 (!) 21   Temp:    (!) 97.5 F (36.4 C)  TempSrc:    Oral  SpO2:  97% 97%   Weight:    96.2 kg  Height:    5' 11 (1.803 m)    Intake/Output Summary (Last 24 hours) at 10/22/2024 1026 Last data filed at 10/21/2024 1700 Gross per 24 hour  Intake 600 ml  Output --  Net 600 ml   Filed Weights   10/20/24 1453 10/21/24 0748 10/22/24 1018  Weight: 99.8 kg 100.8 kg 96.2 kg   Examination:  General exam: Appears calm and comfortable but anxious  Respiratory system: Clear to auscultation. Respiratory effort normal. Cardiovascular system: normal S1 & S2 heard. No  JVD, murmurs, rubs, gallops or clicks. No pedal edema. Gastrointestinal system: Abdomen is nondistended, soft and nontender. No organomegaly or masses felt. Normal bowel sounds heard. Central nervous system: Alert and oriented. No focal neurological deficits. Extremities: Symmetric 5 x 5 power. Skin: No rashes, lesions or ulcers. Psychiatry: Judgement and insight appear normal. Mood & affect appropriate.   Data Reviewed: I have personally reviewed following labs and imaging studies  CBC: Recent Labs  Lab 10/20/24 1618 10/21/24 0920  WBC 4.2 3.9*  HGB 13.7 14.2   HCT 44.9 45.8  MCV 74.5* 74.2*  PLT 219 208    Basic Metabolic Panel: Recent Labs  Lab 10/20/24 1618 10/20/24 1925 10/21/24 0920  NA 138  --   --   K 3.8  --   --   CL 101  --   --   CO2 26  --   --   GLUCOSE 101*  --   --   BUN 16  --   --   CREATININE 1.04  --  1.02  CALCIUM 9.6  --   --   MG  --  2.2  --     CBG: No results for input(s): GLUCAP in the last 168 hours.  No results found for this or any previous visit (from the past 240 hours).   Radiology Studies: CT Angio Chest Pulmonary Embolism (PE) W or WO Contrast Result Date: 10/21/2024 CLINICAL DATA:  Chest pain.  Concern for pulmonary embolism. EXAM: CT ANGIOGRAPHY CHEST WITH CONTRAST TECHNIQUE: Multidetector CT imaging of the chest was performed using the standard protocol during bolus administration of intravenous contrast. Multiplanar CT image reconstructions and MIPs were obtained to evaluate the vascular anatomy. RADIATION DOSE REDUCTION: This exam was performed according to the departmental dose-optimization program which includes automated exposure control, adjustment of the mA and/or kV according to patient size and/or use of iterative reconstruction technique. CONTRAST:  75mL OMNIPAQUE  IOHEXOL  350 MG/ML SOLN COMPARISON:  Chest radiograph dated 10/20/2024 and CT dated 01/01/2023. FINDINGS: Cardiovascular: There is no cardiomegaly or pericardial effusion. The thoracic aorta is unremarkable. No pulmonary artery embolus identified. Mediastinum/Nodes: No hilar or mediastinal adenopathy. The esophagus is grossly unremarkable. No mediastinal fluid collection. Lungs/Pleura: No focal consolidation, pleural effusion, pneumothorax. The central airways are patent. Upper Abdomen: No acute abnormality. Musculoskeletal: No acute osseous pathology. Review of the MIP images confirms the above findings. IMPRESSION: No acute intrathoracic pathology. No CT evidence of pulmonary artery embolus. Electronically Signed   By: Vanetta Chou M.D.   On: 10/21/2024 16:59   ECHOCARDIOGRAM COMPLETE Result Date: 10/21/2024    ECHOCARDIOGRAM REPORT   Patient Name:   Elijah Perez. Date of Exam: 10/21/2024 Medical Rec #:  [id number]          Height:       71.0 in Accession #:    [id number]         Weight:       222.2 lb Date of Birth:  [birth year]         BSA:          2.205 m Patient Age:    57 years           BP:           148/100 mmHg Patient Gender: M                  HR:           64 bpm. Exam Location:  [facility]  Procedure: 2D Echo, Cardiac Doppler, Color Doppler and Strain Analysis (Both            Spectral and Color Flow Doppler were utilized during procedure). Indications:    Chest Pain R07.9  History:        Patient has prior history of Echocardiogram examinations, most                 recent 06/15/2024. Cardiomyopathy, CAD, Signs/Symptoms:Chest                 Pain; Risk Factors:Former Smoker.  Sonographer:    BERNARDA ROCKS Referring Phys: FERNLEA.FOREMAN Quron Ruddy L Porchia Sinkler IMPRESSIONS  1. Left ventricular ejection fraction, by estimation, is 45 to 50%. The left ventricle has mildly decreased function. The left ventricle demonstrates global hypokinesis. There is mild concentric left ventricular hypertrophy. Left ventricular diastolic parameters are consistent with Grade I diastolic dysfunction (impaired relaxation). The average left ventricular global longitudinal strain is -14.3 %. The global longitudinal strain is abnormal.  2. Right ventricular systolic function is normal. The right ventricular size is normal. Tricuspid regurgitation signal is inadequate for assessing PA pressure.  3. The mitral valve is grossly normal. Mild mitral valve regurgitation.  4. The aortic valve is tricuspid. Aortic valve regurgitation is mild. No aortic stenosis is present. Aortic valve mean gradient measures 2.0 mmHg.  5. Aortic dilatation noted. There is mild dilatation of the ascending aorta, measuring 41 mm.  6. The inferior vena cava is normal in size with  greater than 50% respiratory variability, suggesting right atrial pressure of 3 mmHg. Comparison(s): Prior images reviewed side by side. LVEF 45-50%, similar to prior study including GLS. Mildly dilated ascending aorta. Mild aortic regurgitation. FINDINGS  Left Ventricle: Left ventricular ejection fraction, by estimation, is 45 to 50%. The left ventricle has mildly decreased function. The left ventricle demonstrates global hypokinesis. The average left ventricular global longitudinal strain is -14.3 %. Strain was performed and the global longitudinal strain is abnormal. The left ventricular internal cavity size was normal in size. There is mild concentric left ventricular hypertrophy. Left ventricular diastolic parameters are consistent with Grade I diastolic dysfunction (impaired relaxation). Right Ventricle: The right ventricular size is normal. No increase in right ventricular wall thickness. Right ventricular systolic function is normal. Tricuspid regurgitation signal is inadequate for assessing PA pressure. Left Atrium: Left atrial size was normal in size. Right Atrium: Right atrial size was normal in size. Pericardium: There is no evidence of pericardial effusion. Mitral Valve: The mitral valve is grossly normal. Mild mitral valve regurgitation. MV peak gradient, 1.5 mmHg. The mean mitral valve gradient is 1.0 mmHg. Tricuspid Valve: The tricuspid valve is grossly normal. Tricuspid valve regurgitation is trivial. Aortic Valve: The aortic valve is tricuspid. Aortic valve regurgitation is mild. Aortic regurgitation PHT measures 816 msec. No aortic stenosis is present. Aortic valve mean gradient measures 2.0 mmHg. Aortic valve peak gradient measures 3.7 mmHg. Aortic  valve area, by VTI measures 3.78 cm. Pulmonic Valve: The pulmonic valve was grossly normal. Pulmonic valve regurgitation is trivial. Aorta: Aortic dilatation noted. There is mild dilatation of the ascending aorta, measuring 41 mm. Venous: The  inferior vena cava is normal in size with greater than 50% respiratory variability, suggesting right atrial pressure of 3 mmHg. IAS/Shunts: No atrial level shunt detected by color flow Doppler. Additional Comments: 3D was performed not requiring image post processing on an independent workstation and was indeterminate.  LEFT VENTRICLE PLAX 2D LVIDd:  5.30 cm      Diastology LVIDs:         4.30 cm      LV e' medial:    5.22 cm/s LV PW:         1.20 cm      LV E/e' medial:  8.4 LV IVS:        1.10 cm      LV e' lateral:   8.27 cm/s LVOT diam:     2.50 cm      LV E/e' lateral: 5.3 LV SV:         66 LV SV Index:   30           2D Longitudinal Strain LVOT Area:     4.91 cm     2D Strain GLS Avg:     -14.3 %  LV Volumes (MOD) LV vol d, MOD A2C: 117.0 ml LV vol d, MOD A4C: 153.0 ml LV vol s, MOD A2C: 65.8 ml LV vol s, MOD A4C: 79.0 ml LV SV MOD A2C:     51.2 ml LV SV MOD A4C:     153.0 ml LV SV MOD BP:      61.0 ml RIGHT VENTRICLE             IVC RV Basal diam:  3.50 cm     IVC diam: 1.40 cm RV S prime:     11.70 cm/s TAPSE (M-mode): 1.7 cm LEFT ATRIUM             Index        RIGHT ATRIUM           Index LA diam:        3.70 cm 1.68 cm/m   RA Area:     14.90 cm LA Vol (A2C):   49.6 ml 22.49 ml/m  RA Volume:   33.30 ml  15.10 ml/m LA Vol (A4C):   35.2 ml 15.96 ml/m LA Biplane Vol: 42.3 ml 19.18 ml/m  AORTIC VALVE                    PULMONIC VALVE AV Area (Vmax):    3.72 cm     PV Vmax:          0.93 m/s AV Area (Vmean):   3.53 cm     PV Peak grad:     3.5 mmHg AV Area (VTI):     3.78 cm     PR End Diast Vel: 2.32 msec AV Vmax:           96.80 cm/s AV Vmean:          56.800 cm/s AV VTI:            0.174 m AV Peak Grad:      3.7 mmHg AV Mean Grad:      2.0 mmHg LVOT Vmax:         73.40 cm/s LVOT Vmean:        40.800 cm/s LVOT VTI:          0.134 m LVOT/AV VTI ratio: 0.77 AI PHT:            816 msec  AORTA Ao Root diam: 3.50 cm Ao Asc diam:  4.10 cm MITRAL VALVE MV Area (PHT): 3.12 cm    SHUNTS MV Area VTI:    3.36 cm    Systemic VTI:  0.13 m MV Peak grad:  1.5 mmHg    Systemic Diam:  2.50 cm MV Mean grad:  1.0 mmHg MV Vmax:       0.61 m/s MV Vmean:      35.6 cm/s MV Decel Time: 243 msec MR Peak grad: 43.0 mmHg MR Vmax:      328.00 cm/s MV E velocity: 44.00 cm/s MV A velocity: 63.90 cm/s MV E/A ratio:  0.69 Jayson Sierras MD Electronically signed by Jayson Sierras MD Signature Date/Time: 10/21/2024/3:18:44 PM    Final    DG Chest 2 View Result Date: 10/20/2024 EXAM: 2 VIEW(S) XRAY OF THE CHEST 10/20/2024 03:18:00 PM COMPARISON: Chest x-ray 10/23/2023. CLINICAL HISTORY: CP. FINDINGS: LUNGS AND PLEURA: No focal pulmonary opacity. No pulmonary edema. No pleural effusion. No pneumothorax. HEART AND MEDIASTINUM: No acute abnormality of the cardiac and mediastinal silhouettes. BONES AND SOFT TISSUES: No acute osseous abnormality. IMPRESSION: 1. No acute process. Electronically signed by: Greig Pique MD 10/20/2024 03:50 PM EST RP Workstation: [id number]    Scheduled Meds:  allopurinol   100 mg Oral Daily   Chlorhexidine Gluconate Cloth  6 each Topical Q0600   empagliflozin   10 mg Oral QAC breakfast   enoxaparin  (LOVENOX ) injection  40 mg Subcutaneous Q24H   ferrous sulfate  325 mg Oral Q M,W,F   spironolactone   25 mg Oral Daily   And   hydrochlorothiazide  25 mg Oral Daily   ketotifen  2 drop Both Eyes Daily   levothyroxine   200 mcg Oral QAC breakfast   pantoprazole   40 mg Oral Daily   sacubitril -valsartan   1 tablet Oral BID   Continuous Infusions:  amiodarone 60 mg/hr (10/22/24 1017)   Followed by   amiodarone       LOS: 0 days   Critical Care Procedure Note Authorized and Performed by: KYM Louder MD  Total Critical Care time:   60 mins  Due to a high probability of clinically significant, life threatening deterioration, the patient required my highest level of preparedness to intervene emergently and I personally spent this critical care time directly and personally managing the patient.  This  critical care time included obtaining a history; examining the patient, pulse oximetry; ordering and review of studies; arranging urgent treatment with development of a management plan; evaluation of patient's response of treatment; frequent reassessment; and discussions with other providers.  This critical care time was performed to assess and manage the high probability of imminent and life threatening deterioration that could result in multi-organ failure.  It was exclusive of separately billable procedures and treating other patients and teaching time.   Afton Louder, MD How to contact the [facility] Attending or Consulting provider 7A - 7P or covering provider during after hours 7P -7A, for this patient?  Check the care team in [facility] and look for a) attending/consulting [facility] provider listed and b) the [facility] team listed Log into [email or web address] to find provider on call.  Locate the [facility] provider you are looking for under [facility] and page to a number that you can be directly reached. If you still have difficulty reaching the provider, please page the [facility] (Director on Call) for the Hospitalists listed on amion for assistance.  10/22/2024, 10:26 AM

## 2024-10-22 NOTE — Progress Notes (Signed)
 Heart Failure Navigator Progress Note Current Advanced Heart Failure Team patient.  Last seen 06/01/2024. Patient had a recent admission to [facility] on 10/20/2024. Unable to reach him on his room phone at [facility] or his cell phone.  Left him a voice mail on his cell phone with details about his hospital follow-up at the Heart Failure Clinic @ [facility] on 10/30/2024 @ 3:30.  Also included hospital follow-up appointment on his discharge AVS.  Navigator will sign off at this time.  Charmaine Pines, RN, BSN [facility] Heart Failure Navigator Secure Chat Only
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
## 2024-10-23 ENCOUNTER — Inpatient Hospital Stay (HOSPITAL_COMMUNITY)
Admission: EM | Disposition: A | Discharge status: Home / Self Care | Payer: Self-pay | Source: Home / Self Care | Attending: Family Medicine

## 2024-10-23 ENCOUNTER — Inpatient Hospital Stay (HOSPITAL_COMMUNITY)

## 2024-10-23 DIAGNOSIS — I472 Ventricular tachycardia, unspecified: Secondary | ICD-10-CM | POA: Diagnosis not present

## 2024-10-23 HISTORY — PX: ICD IMPLANT: EP1208

## 2024-10-23 LAB — BASIC METABOLIC PANEL WITH GFR
Anion gap: 12 (ref 5–15)
BUN: 10 mg/dL (ref 6–20)
CO2: 26 mmol/L (ref 22–32)
Calcium: 9.7 mg/dL (ref 8.9–10.3)
Chloride: 96 mmol/L — ABNORMAL LOW (ref 98–111)
Creatinine, Ser: 1.56 mg/dL — ABNORMAL HIGH (ref 0.61–1.24)
GFR, Estimated: 52 mL/min — ABNORMAL LOW (ref >60)
Glucose, Bld: 108 mg/dL — ABNORMAL HIGH (ref 70–99)
Potassium: 4.8 mmol/L (ref 3.5–5.1)
Sodium: 134 mmol/L — ABNORMAL LOW (ref 135–145)

## 2024-10-23 LAB — CBC
HCT: 50.2 % (ref 39.0–52.0)
Hemoglobin: 15.4 g/dL (ref 13.0–17.0)
MCH: 22.4 pg — ABNORMAL LOW (ref 26.0–34.0)
MCHC: 30.7 g/dL (ref 30.0–36.0)
MCV: 72.9 fL — ABNORMAL LOW (ref 80.0–100.0)
Platelets: 219 K/uL (ref 150–400)
RBC: 6.89 MIL/uL — ABNORMAL HIGH (ref 4.22–5.81)
RDW: 17.9 % — ABNORMAL HIGH (ref 11.5–15.5)
WBC: 6.4 K/uL (ref 4.0–10.5)
nRBC: 0 % (ref 0.0–0.2)

## 2024-10-23 LAB — SURGICAL PCR SCREEN
MRSA, PCR: NEGATIVE
Staphylococcus aureus: NEGATIVE

## 2024-10-23 SURGERY — ICD IMPLANT

## 2024-10-23 MED ORDER — SODIUM CHLORIDE 0.9% FLUSH: 3.0000 mL | Freq: Two times a day (BID) | INTRAVENOUS | Status: DC

## 2024-10-23 MED ORDER — SODIUM CHLORIDE 0.9 % IV SOLN: INTRAVENOUS | Status: DC

## 2024-10-23 MED ORDER — CHLORHEXIDINE GLUCONATE 4 % EX SOLN: 60.0000 mL | Freq: Once | CUTANEOUS | Status: AC

## 2024-10-23 MED ORDER — CHLORHEXIDINE GLUCONATE 4 % EX SOLN: 60.0000 mL | Freq: Once | CUTANEOUS | Status: DC

## 2024-10-23 MED ORDER — SODIUM CHLORIDE 0.9 % IV SOLN
80.0000 mg | INTRAVENOUS | Status: DC
  Filled 2024-10-23: qty 2

## 2024-10-23 MED ORDER — FENTANYL CITRATE (PF) 100 MCG/2ML IJ SOLN
INTRAMUSCULAR | Status: AC
  Filled 2024-10-23: qty 2

## 2024-10-23 MED ORDER — LIDOCAINE HCL (PF) 1 % IJ SOLN
INTRAMUSCULAR | Status: AC
  Filled 2024-10-23: qty 60

## 2024-10-23 MED ORDER — FENTANYL CITRATE (PF) 100 MCG/2ML IJ SOLN
INTRAMUSCULAR | Status: DC | PRN
  Administered 2024-10-23 (×2): 50 ug via INTRAVENOUS

## 2024-10-23 MED ORDER — IOHEXOL 350 MG/ML SOLN
INTRAVENOUS | Status: DC | PRN
  Administered 2024-10-23: 15 mL

## 2024-10-23 MED ORDER — CEFAZOLIN SODIUM-DEXTROSE 2-4 GM/100ML-% IV SOLN
2.0000 g | INTRAVENOUS | Status: AC
  Filled 2024-10-23: qty 100

## 2024-10-23 MED ORDER — SODIUM CHLORIDE 0.9 % IV SOLN
INTRAVENOUS | Status: AC
  Administered 2024-10-23: 80 mg
  Filled 2024-10-23: qty 2

## 2024-10-23 MED ORDER — CHLORHEXIDINE GLUCONATE 4 % EX SOLN
60.0000 mL | Freq: Once | CUTANEOUS | Status: AC
  Administered 2024-10-23: 4 via TOPICAL

## 2024-10-23 MED ORDER — OXYCODONE HCL 5 MG PO TABS
5.0000 mg | ORAL_TABLET | Freq: Four times a day (QID) | ORAL | Status: DC | PRN
  Administered 2024-10-23 – 2024-10-24 (×4): 5 mg via ORAL
  Filled 2024-10-23 (×4): qty 1

## 2024-10-23 MED ORDER — ACETAMINOPHEN 500 MG PO TABS
1000.0000 mg | ORAL_TABLET | Freq: Three times a day (TID) | ORAL | Status: DC
  Administered 2024-10-23 – 2024-10-25 (×5): 1000 mg via ORAL
  Filled 2024-10-23 (×5): qty 2

## 2024-10-23 MED ORDER — GADOBUTROL 1 MMOL/ML IV SOLN
13.0000 mL | Freq: Once | INTRAVENOUS | Status: AC | PRN
  Administered 2024-10-23: 13 mL via INTRAVENOUS

## 2024-10-23 MED ORDER — CEFAZOLIN SODIUM-DEXTROSE 2-4 GM/100ML-% IV SOLN
INTRAVENOUS | Status: AC
  Administered 2024-10-23: 2 g via INTRAVENOUS
  Filled 2024-10-23: qty 100

## 2024-10-23 MED ORDER — HEPARIN (PORCINE) IN NACL 1000-0.9 UT/500ML-% IV SOLN
INTRAVENOUS | Status: DC | PRN
  Administered 2024-10-23: 500 mL

## 2024-10-23 MED ORDER — SODIUM CHLORIDE 0.9% FLUSH: 3.0000 mL | INTRAVENOUS | Status: DC | PRN

## 2024-10-23 MED ORDER — LIDOCAINE HCL (PF) 1 % IJ SOLN
INTRAMUSCULAR | Status: DC | PRN
Start: 2024-10-23 — End: 2024-10-23
  Administered 2024-10-23: 60 mL

## 2024-10-23 MED ORDER — MIDAZOLAM HCL 2 MG/2ML IJ SOLN
INTRAMUSCULAR | Status: AC
Start: 2024-10-23 — End: 2024-10-23
  Filled 2024-10-23: qty 2

## 2024-10-23 MED ORDER — OFF THE BEAT BOOK
Freq: Once | Status: AC
  Filled 2024-10-23: qty 1

## 2024-10-23 MED ORDER — MIDAZOLAM HCL 5 MG/5ML IJ SOLN
INTRAMUSCULAR | Status: DC | PRN
  Administered 2024-10-23 (×2): 1 mg via INTRAVENOUS

## 2024-10-23 MED ORDER — SODIUM CHLORIDE 0.9 % IV SOLN
80.0000 mg | INTRAVENOUS | Status: AC
  Filled 2024-10-23: qty 2

## 2024-10-23 MED ORDER — CEFAZOLIN SODIUM-DEXTROSE 2-4 GM/100ML-% IV SOLN
2.0000 g | INTRAVENOUS | Status: DC
  Filled 2024-10-23: qty 100

## 2024-10-23 SURGICAL SUPPLY — 11 items
CABLE SURGICAL S-101-97-12 (CABLE) ×1 IMPLANT
ICD GALLANT DR CDDRA500Q (ICD Generator) IMPLANT
KIT MICROPUNCTURE NIT STIFF (SHEATH) IMPLANT
LEAD DURATA 7122Q-65CM (Lead) IMPLANT
LEAD ULTIPACE 52 LPA1231/52 (Lead) IMPLANT
PAD DEFIB RADIO PHYSIO CONN (PAD) ×1 IMPLANT
SHEATH 7FR PRELUDE SNAP 13 (SHEATH) IMPLANT
SHEATH 9FR PRELUDE SNAP 13 (SHEATH) IMPLANT
SHEATH PROBE COVER 6X72 (BAG) IMPLANT
TRAY PACEMAKER INSERTION (PACKS) ×1 IMPLANT
WIRE MICROINTRODUCER 60CM (WIRE) IMPLANT

## 2024-10-23 NOTE — Consult Note (Signed)
 Cardiology Consultation   Patient ID: Elijah Perez. MRN: [id number]; DOB: 1966/09/27  Admit date: 10/20/2024 Date of Consult: 10/23/2024  PCP:  [facility] Providers Cardiologist:  Alvan Carrier, MD   {    Patient Profile: Elijah Perez. is a 58 y.o. male with a hx of LV dysfunction  who is being seen 10/23/2024 for the evaluation of sustained VT at the request of Dr. Alvan.  History of Present Illness: Mr. Fackler carries a h/o of non-ischemic CM with no obstructive CAD by left heart cath. His EF has been as low as 30% and up to 45%. He had a CMRI 18 months ago showing an EF of 35% and non-specific RV insertion site scar. He has been on GDMT. He presented with chest pain and was transferred to [facility] after experiencing a RBBB VT at 195/min associated with palpitations and near syncope. He was treated with IV amiodarone and NSR was restored. The VT has positive pre-cordial concordance.    Past Medical History:  Diagnosis Date   Cancer (HCC)    thyroid   GERD (gastroesophageal reflux disease)    Gout    HFrEF (heart failure with reduced ejection fraction) (HCC)    a. EF 30-35% in 12/2022 with cath showing no evidence of CAD   Horner's syndrome    Hypothyroidism    Thalassemia trait     Past Surgical History:  Procedure Laterality Date   COLONOSCOPY WITH PROPOFOL  N/A 09/17/2023   Procedure: COLONOSCOPY WITH PROPOFOL ;  Surgeon: Eartha Angelia Sieving, MD;  Location: [facility];  Service: Gastroenterology;  Laterality: N/A;  11:30am, asa 3   EYE SURGERY     LEFT HEART CATH AND CORONARY ANGIOGRAPHY N/A 10/22/2024   Procedure: LEFT HEART CATH AND CORONARY ANGIOGRAPHY;  Surgeon: Jordan, Peter M, MD;  Location: [facility] INVASIVE CV LAB;  Service: Cardiovascular;  Laterality: N/A;   NASOPHARYNGOSCOPY EUSTATION TUBE BALLOON DILATION Bilateral 06/24/2024   Procedure: NASOPHARYNGOSCOPY, WITH EUSTACHIAN TUBE DILATION USING BALLOON;  Surgeon:  Luciano Standing, MD;  Location: [facility];  Service: ENT;  Laterality: Bilateral;  BILATERAL EUSTACHIAN TUBE BALLOON DILATION   RIGHT/LEFT HEART CATH AND CORONARY ANGIOGRAPHY N/A 01/03/2023   Procedure: RIGHT/LEFT HEART CATH AND CORONARY ANGIOGRAPHY;  Surgeon: Verlin Lonni BIRCH, MD;  Location: [facility] INVASIVE CV LAB;  Service: Cardiovascular;  Laterality: N/A;   THYROIDECTOMY Bilateral      Home Medications:  Prior to Admission medications   Medication Sig Start Date End Date Taking? Authorizing Provider  allopurinol  (ZYLOPRIM ) 100 MG tablet Take 100 mg by mouth daily. 11/26/19  Yes [provider]  empagliflozin  (JARDIANCE ) 10 MG TABS tablet Take 1 tablet (10 mg total) by mouth daily before breakfast. 03/05/24  Yes Sabharwal, Aditya, DO  eplerenone (INSPRA) 25 MG tablet Take 25 mg by mouth daily. 06/09/24  Yes [provider]  ferrous sulfate 325 (65 FE) MG EC tablet Take 325 mg by mouth every Monday, Wednesday, and Friday.   Yes [provider]  ketotifen (ZADITOR) 0.035 % ophthalmic solution Place 2 drops into both eyes daily. 12/23/23  Yes [provider]  levothyroxine  (SYNTHROID ) 200 MCG tablet Take 200 mcg by mouth daily before breakfast.   Yes [provider]  metoprolol  succinate (TOPROL  XL) 50 MG 24 hr tablet Take 1 tablet (50 mg total) by mouth at bedtime. 10/05/24  Yes BranchCarrier FALCON, MD  omeprazole (PRILOSEC) 20 MG capsule Take 20 mg by mouth daily.  Yes [provider]  sacubitril -valsartan  (ENTRESTO ) 24-26 MG Take 1 tablet by mouth 2 (two) times daily. 06/01/24  Yes Sabharwal, Aditya, DO  spironolactone  (ALDACTONE ) 25 MG tablet Take 1 tablet (25 mg total) by mouth daily. 03/04/24  Yes Sabharwal, Aditya, DO    Scheduled Meds:  allopurinol   100 mg Oral Daily   enoxaparin  (LOVENOX ) injection  40 mg Subcutaneous Q24H   ferrous sulfate  325 mg Oral Q M,W,F   ketotifen  2 drop Both Eyes Daily   levothyroxine   200  mcg Oral QAC breakfast   pantoprazole   40 mg Oral Daily   sacubitril -valsartan   1 tablet Oral BID   senna-docusate  1 tablet Oral BID   sodium chloride  flush  3 mL Intravenous Q12H   spironolactone   25 mg Oral Daily   Continuous Infusions:  sodium chloride      amiodarone 30 mg/hr (10/23/24 1000)   PRN Meds: sodium chloride , acetaminophen , ALPRAZolam, nitroGLYCERIN, ondansetron  (ZOFRAN ) IV, sodium chloride  flush  Allergies:    Allergies  Allergen Reactions   Other Other (See Comments)    Pet dander, pollen -> eyes itchy/watery, sneezing   Shellfish Allergy Itching   Wound Dressing Adhesive Other (See Comments)    Blisters     Social History:   Social History   Socioeconomic History   Marital status: Married    Spouse name: Emmily   Number of children: 8   Years of education: Not on file   Highest education level: High school graduate  Occupational History   Occupation: [employer]  Tobacco Use   Smoking status: Former    Current packs/day: 0.00    Types: Cigarettes    Quit date: 1996    Years since quitting: 29.8   Smokeless tobacco: Never  Vaping Use   Vaping status: Never Used  Substance and Sexual Activity   Alcohol use: Yes    Alcohol/week: 2.0 standard drinks of alcohol    Types: 2 Glasses of wine per week    Comment: social   Drug use: Not Currently   Sexual activity: Yes  Other Topics Concern   Not on file  Social History Narrative   Not on file   Social Drivers of Health   Financial Resource Strain: Low Risk  (01/04/2023)   Overall Financial Resource Strain (CARDIA)    Difficulty of Paying Living Expenses: Not very hard  Food Insecurity: No Food Insecurity (10/21/2024)   Hunger Vital Sign    Worried About Running Out of Food in the Last Year: Never true    Ran Out of Food in the Last Year: Never true  Transportation Needs: No Transportation Needs (10/21/2024)   PRAPARE - Administrator, Civil Service (Medical): No    Lack of  Transportation (Non-Medical): No  Physical Activity: Not on file  Stress: Not on file  Social Connections: Socially Integrated (10/21/2024)   Social Connection and Isolation Panel    Frequency of Communication with Friends and Family: Three times a week    Frequency of Social Gatherings with Friends and Family: Three times a week    Attends Religious Services: More than 4 times per year    Active Member of Clubs or Organizations: No    Attends Banker Meetings: 1 to 4 times per year    Marital Status: Married  Catering Manager Violence: Not At Risk (10/21/2024)   Humiliation, Afraid, Rape, and Kick questionnaire    Fear of Current or Ex-Partner: No  Emotionally Abused: No    Physically Abused: No    Sexually Abused: No    Family History:    Family History  Problem Relation Age of Onset   Valvular heart disease Mother    Stroke Father    Heart murmur Brother    Atrial fibrillation Brother    Cancer - Colon Neg Hx      ROS:  Please see the history of present illness.   All other ROS reviewed and negative.     Physical Exam/Data: Vitals:   10/23/24 0020 10/23/24 0422 10/23/24 0849 10/23/24 0850  BP: (!) 131/94 130/88  118/88  Pulse: (!) 59 61    Resp: 19 18  16   Temp: 97.6 F (36.4 C) 98 F (36.7 C) 98.1 F (36.7 C) 98.1 F (36.7 C)  TempSrc: Oral Oral Oral Oral  SpO2: 100%   96%  Weight:  97.6 kg    Height:        Intake/Output Summary (Last 24 hours) at 10/23/2024 1141 Last data filed at 10/23/2024 0500 Gross per 24 hour  Intake 1088.58 ml  Output --  Net 1088.58 ml      10/23/2024    4:22 AM 10/22/2024   10:18 AM 10/21/2024    7:48 AM  Last 3 Weights  Weight (lbs) 215 lb 1.6 oz 212 lb 1.3 oz 222 lb 3.6 oz  Weight (kg) 97.569 kg 96.2 kg 100.8 kg     Body mass index is 30 kg/m.  General:  Well nourished, well developed, in no acute distress HEENT: normal Neck: no JVD Vascular: No carotid bruits; Distal pulses 2+ bilaterally Cardiac:   normal S1, S2; RRR; no murmur  Lungs:  clear to auscultation bilaterally, no wheezing, rhonchi or rales  Abd: soft, nontender, no hepatomegaly  Ext: no edema Musculoskeletal:  No deformities, BUE and BLE strength normal and equal Skin: warm and dry  Neuro:  CNs 2-12 intact, no focal abnormalities noted Psych:  Normal affect   EKG:  The EKG was personally reviewed and demonstrates:  NSR Telemetry:  Telemetry was personally reviewed and demonstrates:  NSR with rare PVC's  Relevant CV Studies: Left heart cath noted.   Laboratory Data: High Sensitivity Troponin:  No results for input(s): TROPONINIHS in the last 720 hours.   Chemistry Recent Labs  Lab 10/20/24 1618 10/20/24 1925 10/21/24 0920 10/22/24 1025 10/22/24 1834 10/23/24 0419  NA 138  --   --  135  --  134*  K 3.8  --   --  3.5  --  4.8  CL 101  --   --  97*  --  96*  CO2 26  --   --  24  --  26  GLUCOSE 101*  --   --  166*  --  108*  BUN 16  --   --  14  --  10  CREATININE 1.04  --    < > 1.17 1.48* 1.56*  CALCIUM 9.6  --   --  9.8  --  9.7  MG  --  2.2  --  2.2  --   --   GFRNONAA >60  --    < > >60 55* 52*  ANIONGAP 11  --   --  14  --  12   < > = values in this interval not displayed.    No results for input(s): PROT, ALBUMIN, AST, ALT, ALKPHOS, BILITOT in the last 168 hours. Lipids  Recent Labs  Lab  10/21/24 0920  CHOL 268*  TRIG 176*  HDL 130  LDLCALC 103*  CHOLHDL 2.1    Hematology Recent Labs  Lab 10/21/24 0920 10/22/24 1834 10/23/24 0419  WBC 3.9* 4.9 6.4  RBC 6.17* 6.58* 6.89*  HGB 14.2 15.0 15.4  HCT 45.8 48.1 50.2  MCV 74.2* 73.1* 72.9*  MCH 23.0* 22.8* 22.4*  MCHC 31.0 31.2 30.7  RDW 17.2* 17.3* 17.9*  PLT 208 205 219   Thyroid  Recent Labs  Lab 10/22/24 1025  TSH 0.538    BNP Recent Labs  Lab 10/20/24 1618  PROBNP 90.6    DDimer No results for input(s): DDIMER in the last 168 hours.  Radiology/Studies:  CARDIAC CATHETERIZATION Result Date: 10/22/2024    There is mild to moderate left ventricular systolic dysfunction.   LV end diastolic pressure is normal.   The left ventricular ejection fraction is 45-50% by visual estimate. Normal coronary anatomy Mild LV dysfunction. EF 45% Normal LVEDP 6 mm Hg Plan: EP to evaluate for management of arrhythmia  CT Angio Chest Pulmonary Embolism (PE) W or WO Contrast Result Date: 10/21/2024 CLINICAL DATA:  Chest pain.  Concern for pulmonary embolism. EXAM: CT ANGIOGRAPHY CHEST WITH CONTRAST TECHNIQUE: Multidetector CT imaging of the chest was performed using the standard protocol during bolus administration of intravenous contrast. Multiplanar CT image reconstructions and MIPs were obtained to evaluate the vascular anatomy. RADIATION DOSE REDUCTION: This exam was performed according to the departmental dose-optimization program which includes automated exposure control, adjustment of the mA and/or kV according to patient size and/or use of iterative reconstruction technique. CONTRAST:  75mL OMNIPAQUE  IOHEXOL  350 MG/ML SOLN COMPARISON:  Chest radiograph dated 10/20/2024 and CT dated 01/01/2023. FINDINGS: Cardiovascular: There is no cardiomegaly or pericardial effusion. The thoracic aorta is unremarkable. No pulmonary artery embolus identified. Mediastinum/Nodes: No hilar or mediastinal adenopathy. The esophagus is grossly unremarkable. No mediastinal fluid collection. Lungs/Pleura: No focal consolidation, pleural effusion, pneumothorax. The central airways are patent. Upper Abdomen: No acute abnormality. Musculoskeletal: No acute osseous pathology. Review of the MIP images confirms the above findings. IMPRESSION: No acute intrathoracic pathology. No CT evidence of pulmonary artery embolus. Electronically Signed   By: Vanetta Chou M.D.   On: 10/21/2024 16:59   ECHOCARDIOGRAM COMPLETE Result Date: 10/21/2024    ECHOCARDIOGRAM REPORT   Patient Name:   Harvie Morua. Date of Exam: 10/21/2024 Medical Rec #:  [id number]           Height:       71.0 in Accession #:    [id number]         Weight:       222.2 lb Date of Birth:  28-Dec-1965         BSA:          2.205 m Patient Age:    57 years           BP:           148/100 mmHg Patient Gender: M                  HR:           64 bpm. Exam Location:  [facility] Procedure: 2D Echo, Cardiac Doppler, Color Doppler and Strain Analysis (Both            Spectral and Color Flow Doppler were utilized during procedure). Indications:    Chest Pain R07.9  History:        Patient has prior history of  Echocardiogram examinations, most                 recent 06/15/2024. Cardiomyopathy, CAD, Signs/Symptoms:Chest                 Pain; Risk Factors:Former Smoker.  Sonographer:    BERNARDA ROCKS Referring Phys: FERNLEA.FOREMAN CLANFORD L JOHNSON IMPRESSIONS  1. Left ventricular ejection fraction, by estimation, is 45 to 50%. The left ventricle has mildly decreased function. The left ventricle demonstrates global hypokinesis. There is mild concentric left ventricular hypertrophy. Left ventricular diastolic parameters are consistent with Grade I diastolic dysfunction (impaired relaxation). The average left ventricular global longitudinal strain is -14.3 %. The global longitudinal strain is abnormal.  2. Right ventricular systolic function is normal. The right ventricular size is normal. Tricuspid regurgitation signal is inadequate for assessing PA pressure.  3. The mitral valve is grossly normal. Mild mitral valve regurgitation.  4. The aortic valve is tricuspid. Aortic valve regurgitation is mild. No aortic stenosis is present. Aortic valve mean gradient measures 2.0 mmHg.  5. Aortic dilatation noted. There is mild dilatation of the ascending aorta, measuring 41 mm.  6. The inferior vena cava is normal in size with greater than 50% respiratory variability, suggesting right atrial pressure of 3 mmHg. Comparison(s): Prior images reviewed side by side. LVEF 45-50%, similar to prior study including GLS. Mildly dilated  ascending aorta. Mild aortic regurgitation. FINDINGS  Left Ventricle: Left ventricular ejection fraction, by estimation, is 45 to 50%. The left ventricle has mildly decreased function. The left ventricle demonstrates global hypokinesis. The average left ventricular global longitudinal strain is -14.3 %. Strain was performed and the global longitudinal strain is abnormal. The left ventricular internal cavity size was normal in size. There is mild concentric left ventricular hypertrophy. Left ventricular diastolic parameters are consistent with Grade I diastolic dysfunction (impaired relaxation). Right Ventricle: The right ventricular size is normal. No increase in right ventricular wall thickness. Right ventricular systolic function is normal. Tricuspid regurgitation signal is inadequate for assessing PA pressure. Left Atrium: Left atrial size was normal in size. Right Atrium: Right atrial size was normal in size. Pericardium: There is no evidence of pericardial effusion. Mitral Valve: The mitral valve is grossly normal. Mild mitral valve regurgitation. MV peak gradient, 1.5 mmHg. The mean mitral valve gradient is 1.0 mmHg. Tricuspid Valve: The tricuspid valve is grossly normal. Tricuspid valve regurgitation is trivial. Aortic Valve: The aortic valve is tricuspid. Aortic valve regurgitation is mild. Aortic regurgitation PHT measures 816 msec. No aortic stenosis is present. Aortic valve mean gradient measures 2.0 mmHg. Aortic valve peak gradient measures 3.7 mmHg. Aortic  valve area, by VTI measures 3.78 cm. Pulmonic Valve: The pulmonic valve was grossly normal. Pulmonic valve regurgitation is trivial. Aorta: Aortic dilatation noted. There is mild dilatation of the ascending aorta, measuring 41 mm. Venous: The inferior vena cava is normal in size with greater than 50% respiratory variability, suggesting right atrial pressure of 3 mmHg. IAS/Shunts: No atrial level shunt detected by color flow Doppler. Additional  Comments: 3D was performed not requiring image post processing on an independent workstation and was indeterminate.  LEFT VENTRICLE PLAX 2D LVIDd:         5.30 cm      Diastology LVIDs:         4.30 cm      LV e' medial:    5.22 cm/s LV PW:         1.20 cm      LV E/e'  medial:  8.4 LV IVS:        1.10 cm      LV e' lateral:   8.27 cm/s LVOT diam:     2.50 cm      LV E/e' lateral: 5.3 LV SV:         66 LV SV Index:   30           2D Longitudinal Strain LVOT Area:     4.91 cm     2D Strain GLS Avg:     -14.3 %  LV Volumes (MOD) LV vol d, MOD A2C: 117.0 ml LV vol d, MOD A4C: 153.0 ml LV vol s, MOD A2C: 65.8 ml LV vol s, MOD A4C: 79.0 ml LV SV MOD A2C:     51.2 ml LV SV MOD A4C:     153.0 ml LV SV MOD BP:      61.0 ml RIGHT VENTRICLE             IVC RV Basal diam:  3.50 cm     IVC diam: 1.40 cm RV S prime:     11.70 cm/s TAPSE (M-mode): 1.7 cm LEFT ATRIUM             Index        RIGHT ATRIUM           Index LA diam:        3.70 cm 1.68 cm/m   RA Area:     14.90 cm LA Vol (A2C):   49.6 ml 22.49 ml/m  RA Volume:   33.30 ml  15.10 ml/m LA Vol (A4C):   35.2 ml 15.96 ml/m LA Biplane Vol: 42.3 ml 19.18 ml/m  AORTIC VALVE                    PULMONIC VALVE AV Area (Vmax):    3.72 cm     PV Vmax:          0.93 m/s AV Area (Vmean):   3.53 cm     PV Peak grad:     3.5 mmHg AV Area (VTI):     3.78 cm     PR End Diast Vel: 2.32 msec AV Vmax:           96.80 cm/s AV Vmean:          56.800 cm/s AV VTI:            0.174 m AV Peak Grad:      3.7 mmHg AV Mean Grad:      2.0 mmHg LVOT Vmax:         73.40 cm/s LVOT Vmean:        40.800 cm/s LVOT VTI:          0.134 m LVOT/AV VTI ratio: 0.77 AI PHT:            816 msec  AORTA Ao Root diam: 3.50 cm Ao Asc diam:  4.10 cm MITRAL VALVE MV Area (PHT): 3.12 cm    SHUNTS MV Area VTI:   3.36 cm    Systemic VTI:  0.13 m MV Peak grad:  1.5 mmHg    Systemic Diam: 2.50 cm MV Mean grad:  1.0 mmHg MV Vmax:       0.61 m/s MV Vmean:      35.6 cm/s MV Decel Time: 243 msec MR Peak grad: 43.0  mmHg MR Vmax:      328.00 cm/s MV E velocity: 44.00 cm/s  MV A velocity: 63.90 cm/s MV E/A ratio:  0.69 Jayson Sierras MD Electronically signed by Jayson Sierras MD Signature Date/Time: 10/21/2024/3:18:44 PM    Final    MR BRAIN WO CONTRAST Result Date: 10/21/2024 CLINICAL DATA:  Dizziness EXAM: MRI HEAD WITHOUT CONTRAST TECHNIQUE: Multiplanar, multiecho pulse sequences of the brain and surrounding structures were obtained without intravenous contrast. COMPARISON:  None Available. FINDINGS: MRI brain: The brain volume is normal. The signal in the brain parenchyma is normal. There is no acute or chronic infarct. The ventricles are normal. No mass lesion. There are normal flow signals in the carotid arteries and basilar artery. No significant bone marrow signal abnormality. No significant abnormality in the paranasal sinuses or soft tissues. IMPRESSION: Normal Electronically Signed   By: Nancyann Burns M.D.   On: 10/21/2024 09:42   DG Chest 2 View Result Date: 10/20/2024 EXAM: 2 VIEW(S) XRAY OF THE CHEST 10/20/2024 03:18:00 PM COMPARISON: Chest x-ray 10/23/2023. CLINICAL HISTORY: CP. FINDINGS: LUNGS AND PLEURA: No focal pulmonary opacity. No pulmonary edema. No pleural effusion. No pneumothorax. HEART AND MEDIASTINUM: No acute abnormality of the cardiac and mediastinal silhouettes. BONES AND SOFT TISSUES: No acute osseous abnormality. IMPRESSION: 1. No acute process. Electronically signed by: Greig Pique MD 10/20/2024 03:50 PM EST RP Workstation: [id number]     Assessment and Plan: Sustained MMVT - I have discussed the treatment options with the patient and ICD insertion is indicated for secondary prevention. I would treat him with amiodarone for 6-12 months.  LV dysfunction - He will undergo repeat cMRI as per Dr. Barbaraann to see if there has been progression of his disease. If so, he would require an outpatient PET scan looking for active inflammation. The results of the MRI will not change his need for an  ICD.    Risk Assessment/Risk Scores:   For questions or updates, please contact [facility] Please consult [email or web address] for contact info under   Signed, Danelle Birmingham, MD  10/23/2024 11:41 AM

## 2024-10-23 NOTE — Progress Notes (Signed)
 OT Cancellation Note  Patient Details Name: Elijah Perez. MRN: [id number] DOB: [birth year]   Cancelled Treatment:    Reason Eval/Treat Not Completed: Patient at procedure or test/ unavailable. Per secure chat with PT, pt in MRI then going for ICD.  Will re-attempt eval tomorrow.  Donny BECKER OT Acute Rehabilitation Services Office [phone]    [place] Dorothyann Distel 10/23/2024, 12:56 PM

## 2024-10-23 NOTE — Discharge Instructions (Addendum)
 After Your ICD (Implantable Cardiac Defibrillator)   You have a Abbott ICD  If you have a Medtronic or Biotronik device, plug in your home monitor once you get home, and no manual interaction is required.   If you have an Abbott or [employer] device, plug your home monitor once you get home, sit near the device, and press the large activation button. Sit nearby until the process is complete, usually notated by lights on the monitor.   If you were set up for monitoring using an app on your phone, make sure the app remains open in the background and the Bluetooth remains on.  ACTIVITY Do not lift your arm above shoulder height for 1 week after your procedure. After 7 days, you may progress as below.  You should remove your sling 24 hours after your procedure, unless otherwise instructed by your provider.     Friday October 30, 2024  Saturday October 31, 2024 Sunday November 01, 2024 Monday November 02, 2024   Do not lift, push, pull, or carry anything over 10 pounds with the affected arm until 6 weeks (Friday December 04, 2024 ) after your procedure.   You may drive AFTER your wound check, unless you have been told otherwise by your provider.   Ask your healthcare provider when you can go back to work   INCISION/Dressing If you are on a blood thinner such as Coumadin, Xarelto, Eliquis, Plavix, or Pradaxa please confirm with your provider when this should be resumed.   If large square, outer bandage is left in place, this can be removed after 24 hours from your procedure. Do not remove steri-strips or glue as below.   Monitor your defibrillator site for redness, swelling, and drainage. Call the device clinic at [phone] if you experience these symptoms or fever/chills.    If your incision is sealed with Dermabond (glue), you may shower 24 hrs after your procedure or when told by your provider. Do not remove the glue or let the shower hit directly on your site. You may wash  around your site with soap and water .    If you were discharged in a sling, please do not wear this during the day more than 48 hours after your surgery unless otherwise instructed. This may increase the risk of stiffness and soreness in your shoulder.   Avoid lotions, ointments, or perfumes over your incision until it is well-healed.  You may use a hot tub or a pool AFTER your wound check appointment if the incision is completely closed.  Your ICD is designed to protect you from life threatening heart rhythms. Because of this, you may receive a shock.   1 shock with no symptoms:  Call the office during business hours. 1 shock with symptoms (chest pain, chest pressure, dizziness, lightheadedness, shortness of breath, overall feeling unwell):  Call 911. If you experience 2 or more shocks in 24 hours:  Call 911. If you receive a shock, you should not drive for 6 months per the [place] DMV IF you receive appropriate therapy from your ICD.   ICD Alerts:  Some alerts are vibratory and others beep. These are NOT emergencies. Please call our office to let us  know. If this occurs at night or on weekends, it can wait until the next business day. Send a remote transmission.  If your device is capable of reading fluid status (for heart failure), you will be offered monthly monitoring to review this with you.   DEVICE MANAGEMENT Remote  monitoring is used to monitor your ICD from home. This monitoring is scheduled every 91 days by our office. It allows us  to keep an eye on the functioning of your device to ensure it is working properly. You will routinely see your Electrophysiologist annually (more often if necessary). This will appear as a REMOTE check on your MyChart schedule. These are automatic and there is nothing for you to manually do unless otherwise instructed.  You should receive your ID card for your new device in 4-8 weeks. Keep this card with you at all times once received. Consider wearing a  medical alert bracelet or necklace.  Your ICD  may be MRI compatible. This will be discussed at your next office visit/wound check.  You should avoid contact with strong electric or magnetic fields.   Do not use amateur (ham) radio equipment or electric (arc) welding torches. MP3 player headphones with magnets should not be used. Some devices are safe to use if held at least 12 inches (30 cm) from your defibrillator. These include power tools, lawn mowers, and speakers. If you are unsure if something is safe to use, ask your health care provider.  When using your cell phone, hold it to the ear that is on the opposite side from the defibrillator. Do not leave your cell phone in a pocket over the defibrillator.  You may safely use electric blankets, heating pads, computers, and microwave ovens.  Call the office right away if: You have chest pain. You feel more than one shock. You feel more short of breath than you have felt before. You feel more light-headed than you have felt before. Your incision starts to open up.  This information is not intended to replace advice given to you by your health care provider. Make sure you discuss any questions you have with your health care provider.

## 2024-10-23 NOTE — Evaluation (Signed)
 Physical Therapy Evaluation Patient Details Name: Elijah Perez. MRN: [id number] DOB: 09/14/1966 Today's Date: 10/23/2024  History of Present Illness  Pt is a 58 y/o male admitted to ED with Pressure in L chest with 1 episode sharp pain radiating down his left arm.  S/P Cath and ICD/PPM pending 11/7.  PMHx  thyroid CA, Gout, CHF, nonischemic cardiomyopathy, GERD, Horner syndrome, thalassemia trait  Clinical Impression  Pt admitted with/for NSTEMI with symptoms above, ICD placement pending.  Pt currently limited functionally due to the problems listed below.  (see problems list.)  Pt will benefit from PT to maximize function and safety to be able to get home safely with available assist .         If plan is discharge home, recommend the following:  (PRN assist)   Can travel by private vehicle        Equipment Recommendations None recommended by PT  Recommendations for Other Services       Functional Status Assessment Patient has had a recent decline in their functional status and demonstrates the ability to make significant improvements in function in a reasonable and predictable amount of time.     Precautions / Restrictions Precautions Precautions: Fall Recall of Precautions/Restrictions: Intact      Mobility  Bed Mobility Overal bed mobility: Needs Assistance Bed Mobility: Supine to Sit, Sit to Supine     Supine to sit: Modified independent (Device/Increase time) Sit to supine: Modified independent (Device/Increase time)        Transfers Overall transfer level: Needs assistance Equipment used: None Transfers: Sit to/from Stand Sit to Stand: Supervision                Ambulation/Gait Ambulation/Gait assistance: Contact guard assist Gait Distance (Feet): 130 Feet Assistive device: IV Pole Gait Pattern/deviations: Step-through pattern Gait velocity: slower Gait velocity interpretation: 1.31 - 2.62 ft/sec, indicative of limited community ambulator    General Gait Details: generally steady, but lightheaded so therapist stayed close for safety  Stairs            Wheelchair Mobility     Tilt Bed    Modified Rankin (Stroke Patients Only)       Balance Overall balance assessment: Needs assistance Sitting-balance support: No upper extremity supported, Feet supported Sitting balance-Leahy Scale: Fair     Standing balance support: No upper extremity supported, Single extremity supported, During functional activity Standing balance-Leahy Scale: Fair Standing balance comment: lightheaded and felt like holding to IV pole, but able to ambulate with no AD and CGA.                             Pertinent Vitals/Pain Pain Assessment Pain Assessment: No/denies pain    Home Living Family/patient expects to be discharged to:: Private residence Living Arrangements: Spouse/significant other Available Help at Discharge: Family Type of Home: House Home Access: Level entry;Stairs to enter Entrance Stairs-Rails: Lawyer of Steps: 2   Home Layout: One level Home Equipment: None      Prior Function Prior Level of Function : Independent/Modified Independent (not much driving)                     Extremity/Trunk Assessment   Upper Extremity Assessment Upper Extremity Assessment: Overall WFL for tasks assessed    Lower Extremity Assessment Lower Extremity Assessment: Overall WFL for tasks assessed       Communication   Communication Communication: No  apparent difficulties    Cognition Arousal: Alert Behavior During Therapy: WFL for tasks assessed/performed   PT - Cognitive impairments: No apparent impairments                         Following commands: Intact       Cueing Cueing Techniques: Verbal cues     General Comments General comments (skin integrity, edema, etc.): VSS with activity.  limited to basic mobility due to needing to go to MRI.  Initiated  discussion of post ppm/icd  shoulder ROM progression.    Exercises     Assessment/Plan    PT Assessment Patient needs continued PT services  PT Problem List Decreased activity tolerance;Decreased balance;Decreased mobility;Cardiopulmonary status limiting activity       PT Treatment Interventions DME instruction;Gait training;Stair training;Functional mobility training;Therapeutic activities    PT Goals (Current goals can be found in the Care Plan section)  Acute Rehab PT Goals Patient Stated Goal: back to age appropriate activity PT Goal Formulation: With patient Time For Goal Achievement: 10/30/24 Potential to Achieve Goals: Good    Frequency Min 3X/week     Co-evaluation               AM-PAC PT 6 Clicks Mobility  Outcome Measure Help needed turning from your back to your side while in a flat bed without using bedrails?: None Help needed moving from lying on your back to sitting on the side of a flat bed without using bedrails?: None Help needed moving to and from a bed to a chair (including a wheelchair)?: None Help needed standing up from a chair using your arms (e.g., wheelchair or bedside chair)?: None Help needed to walk in hospital room?: A Lot Help needed climbing 3-5 steps with a railing? : A Lot 6 Click Score: 20    End of Session   Activity Tolerance: Patient tolerated treatment well Patient left: in bed (Isitting ready to go to MRU) Nurse Communication: Mobility status PT Visit Diagnosis: Unsteadiness on feet (R26.81)    Time: 8896-8878 PT Time Calculation (min) (ACUTE ONLY): 18 min   Charges:   PT Evaluation $PT Eval Low Complexity: 1 Low   PT General Charges $$ ACUTE PT VISIT: 1 Visit         10/23/2024  India HERO., PT Acute Rehabilitation Services [phone]  (office)  Vinie GAILS Mette [place] 10/23/2024, 2:05 PM

## 2024-10-23 NOTE — Plan of Care (Signed)
  Problem: Education: Goal: Understanding of CV disease, CV risk reduction, and recovery process will improve Outcome: Progressing   Problem: Activity: Goal: Ability to return to baseline activity level will improve Outcome: Progressing   Problem: Cardiovascular: Goal: Ability to achieve and maintain adequate cardiovascular perfusion will improve Outcome: Progressing Goal: Vascular access site(s) Level 0-1 will be maintained Outcome: Progressing   Problem: Health Behavior/Discharge Planning: Goal: Ability to safely manage health-related needs after discharge will improve Outcome: Progressing   Problem: Education: Goal: Knowledge of disease or condition will improve Outcome: Progressing Goal: Understanding of medication regimen will improve Outcome: Progressing   Problem: Activity: Goal: Ability to tolerate increased activity will improve Outcome: Progressing   Problem: Cardiac: Goal: Ability to achieve and maintain adequate cardiopulmonary perfusion will improve Outcome: Progressing   Problem: Health Behavior/Discharge Planning: Goal: Ability to safely manage health-related needs after discharge will improve Outcome: Progressing

## 2024-10-24 ENCOUNTER — Inpatient Hospital Stay (HOSPITAL_COMMUNITY)

## 2024-10-24 DIAGNOSIS — Z95810 Presence of automatic (implantable) cardiac defibrillator: Secondary | ICD-10-CM | POA: Diagnosis not present

## 2024-10-24 DIAGNOSIS — I472 Ventricular tachycardia, unspecified: Secondary | ICD-10-CM | POA: Diagnosis not present

## 2024-10-24 LAB — BASIC METABOLIC PANEL WITH GFR
Anion gap: 11 (ref 5–15)
BUN: 12 mg/dL (ref 6–20)
CO2: 24 mmol/L (ref 22–32)
Calcium: 9.2 mg/dL (ref 8.9–10.3)
Chloride: 99 mmol/L (ref 98–111)
Creatinine, Ser: 1.49 mg/dL — ABNORMAL HIGH (ref 0.61–1.24)
GFR, Estimated: 55 mL/min — ABNORMAL LOW (ref >60)
Glucose, Bld: 116 mg/dL — ABNORMAL HIGH (ref 70–99)
Potassium: 4.2 mmol/L (ref 3.5–5.1)
Sodium: 134 mmol/L — ABNORMAL LOW (ref 135–145)

## 2024-10-24 LAB — LIPOPROTEIN A (LPA): Lipoprotein (a): 253.4 nmol/L — ABNORMAL HIGH (ref <75.0)

## 2024-10-24 MED ORDER — AMIODARONE HCL 200 MG PO TABS
400.0000 mg | ORAL_TABLET | Freq: Two times a day (BID) | ORAL | Status: DC
  Administered 2024-10-24 – 2024-10-25 (×3): 400 mg via ORAL
  Filled 2024-10-24 (×3): qty 2

## 2024-10-24 MED ORDER — ALUM & MAG HYDROXIDE-SIMETH 200-200-20 MG/5ML PO SUSP
30.0000 mL | ORAL | Status: DC | PRN
  Administered 2024-10-24: 30 mL via ORAL
  Filled 2024-10-24: qty 30

## 2024-10-24 MED ORDER — EMPAGLIFLOZIN 10 MG PO TABS
10.0000 mg | ORAL_TABLET | Freq: Every day | ORAL | Status: DC
  Administered 2024-10-24 – 2024-10-25 (×2): 10 mg via ORAL
  Filled 2024-10-24 (×2): qty 1

## 2024-10-24 MED ORDER — METOPROLOL SUCCINATE ER 50 MG PO TB24
50.0000 mg | ORAL_TABLET | Freq: Every day | ORAL | Status: DC
  Administered 2024-10-24 – 2024-10-25 (×2): 50 mg via ORAL
  Filled 2024-10-24 (×2): qty 1

## 2024-10-24 NOTE — Plan of Care (Signed)
  Problem: Education: Goal: Knowledge of General Education information will improve Description: Including pain rating scale, medication(s)/side effects and non-pharmacologic comfort measures 10/24/2024 0838 by Harvin Channing LABOR, RN Outcome: Progressing 10/24/2024 0837 by Harvin Channing LABOR, RN Outcome: Progressing

## 2024-10-24 NOTE — Discharge Summary (Incomplete)
 Discharge Summary   Patient ID: Elijah Perez. MRN: [id number]; DOB: August 04, 1966  Admit date: 10/20/2024 Discharge date: 10/25/2024  PCP:  [facility], [facility] Providers Cardiologist:  Alvan Carrier, MD     Discharge Diagnoses  Principal Problem:   Chest pain Active Problems:   GERD (gastroesophageal reflux disease)   Acquired hypothyroidism   Non-ischemic cardiomyopathy (HCC)   NSVT (nonsustained ventricular tachycardia) (HCC)   Diagnostic Studies/Procedures  ECHO IMPRESSIONS 10/21/2024  1. Left ventricular ejection fraction, by estimation, is 45 to 50%. The  left ventricle has mildly decreased function. The left ventricle  demonstrates global hypokinesis. There is mild concentric left ventricular  hypertrophy. Left ventricular diastolic  parameters are consistent with Grade I diastolic dysfunction (impaired  relaxation). The average left ventricular global longitudinal strain is  -14.3 %. The global longitudinal strain is abnormal.   2. Right ventricular systolic function is normal. The right ventricular  size is normal. Tricuspid regurgitation signal is inadequate for assessing  PA pressure.   3. The mitral valve is grossly normal. Mild mitral valve regurgitation.   4. The aortic valve is tricuspid. Aortic valve regurgitation is mild. No  aortic stenosis is present. Aortic valve mean gradient measures 2.0 mmHg.   5. Aortic dilatation noted. There is mild dilatation of the ascending  aorta, measuring 41 mm.   6. The inferior vena cava is normal in size with greater than 50%  respiratory variability, suggesting right atrial pressure of 3 mmHg.   Comparison(s): Prior images reviewed side by side. LVEF 45-50%, similar to  prior study including GLS. Mildly dilated ascending aorta. Mild aortic  regurgitation.   Cath 10/22/2024   There is mild to moderate left ventricular systolic dysfunction.   LV end diastolic pressure is normal.   The  left ventricular ejection fraction is 45-50% by visual estimate.   Normal coronary anatomy Mild LV dysfunction. EF 45% Normal LVEDP 6 mm Hg   Plan: EP to evaluate for management of arrhythmia   Cardiac MRI 10/23/2024 FINDINGS: 1. The left ventricle is normal in cavity size with mild concentric LVH. The LV demonstrates global hypokinesis with mildly reduced systolic function. The LVEF = 40%.   2. The right ventricle is normal in cavity size and wall thickness. The RV systolic function is mildly reduced without focal wall motion abnormalities or aneurysms. The RVEF = 47%.   3. The right atrium is mildly dilated. The left atrium is normal in size. The interatrial septum bows into the left atrium during systole suggestive RV/RA pressure overload.   4. The aortic valve is trileaflet in morphology. There is no significant aortic valve stenosis. There is mild aortic regurgitation. There is mild mitral regurgitation and trivial pulmonic regurgitation.   5. Delayed enhancement imaging is mildly abnormal. There is mild enhancement at the RV insertion points, which can be seen in the setting of elevated pulmonary pressures.   6. There is no pericardial effusion. The thickness of the pericardium is normal.   7. The proximal aorta is normal in size and caliber.   8. No notable extracardiac findings.   CONCLUSIONS: Mild systolic dysfunction with non-specific late gadolinium enhancement at the RV insertion points consistent with increased pulmonary pressures. No significant myocardial enhancement to suggest an infiltrative process or infarct.  ICD implant Summary 10/23/2024 1. Successful implant of a LEFT sided Abbott DC ICD    Recommendations: 1.Routine post-procedure care with bedrest for 2 hours 2.No heparin  (IV or subcutaneous) for 48  hours. No enoxaparin  (IV or subcutaneous) for 7 days.  3.PA/lateral CXR in AM 4.Wound check in AM 5.Device interrogation in AM _____________    History of Present Illness   Fahd Galea. is a 58 y.o. 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 Alvan.   He presented with chest pain and was [facility] after experiencing a RBBB VT at 195/min associated with palpitations and near syncope. He was treated with IV amiodarone and NSR was restored. The VT has positive pre-cordial concordance.    Hospital Course   Consultants: Dr. Waddell and Dr. Kennyth (EP)  Sustained MMVT  Presented with RBBB VT at 195/min associated with palpitations and near syncope. The VT has positive pre-cordial concordance. Treated with IV amio and NSR was restored.  ECHO 10/21/2024 as below w/ EF 45-50% Cath 10/22/2024 showed normal coronary anatomy, mild LV dysfunction, EF 45%, and normal LVEDP.   Cardiac MRI 10/23/2024: Mild systolic dysfunction LVEF 40% with non-specific late gadolinium enhancement at the RV insertion points consistent with increased pulmonary pressures. No infiltrative process or infarct.  Successful implant of a LEFT sided Abbott DC ICD on 10/23/2024. Recommend treating with amio for 6-12 months.  CXR obtained in the morning of 10/24/2024 showed 2-lead pacemaker with lead tips in the right location, no evidence of pneumothorax. Device interrogation 10/8: Appropriate device function and stable lead parameters. There were some episodes of PMT, so PMT detection rhythm was dropped to 100 bpm.  Discussed post implant instructions regarding activity restrictions and wound care were provided.  Started on IV amiodarone and transition to oral prior to discharge.  Plan to continue amiodarone 400 mg twice daily for 14 days then transition to 200 mg daily thereafter. Patient was seen in the morning of  10/25/2024 by Dr. Kennyth at which time he was doing well, okay for discharge from EP perspective  Atypical Chest pain  Nonobstructive CAD via cath in 12/2022 Tn not c/w ACS  EKG with nonspecific ST changes  Cath normal this admission as above.  Cardiac MRI as above.   HFimpEF EF as low as 30-35% in 12/2022 ECHO 10/21/2024: EF 45-50%, global hypokinesis, mild LVH, G1DD, mild MV regurgitation, mild dilation of ascending aorta at 41 mm PTA: Jardiance  10 mg daily, Aldactone  25 mg daily, Entresto  24/26 mg twice daily, and Toprol -XL 50 mg daily   Did the patient have an acute coronary syndrome (MI, NSTEMI, STEMI, etc) this admission?:  No                               Did the patient have a percutaneous coronary intervention (stent / angioplasty)?:  No.          _____________  Discharge Vitals Blood pressure (!) 141/89, pulse 63, temperature 98.1 F (36.7 C), temperature source Oral, resp. rate 18, height 5' 11 (1.803 m), weight 99 kg, SpO2 96%.  Filed Weights   10/23/24 0422 10/24/24 0517 10/25/24 0352  Weight: 97.6 kg 98.8 kg 99 kg    Labs & Radiologic Studies  CBC Recent Labs    10/22/24 1834 10/23/24 0419  WBC 4.9 6.4  HGB 15.0 15.4  HCT 48.1 50.2  MCV 73.1* 72.9*  PLT 205 219   Basic Metabolic Panel Recent Labs    88/93/74 1025 10/22/24 1834 10/24/24 0444 10/25/24 0349  NA 135   < > 134* 134*  K 3.5   < > 4.2 4.2  CL 97*   < > 99 99  CO2 24   < > 24 26  GLUCOSE 166*   < > 116* 102*  BUN 14   < > 12 12  CREATININE 1.17   < > 1.49* 1.33*  CALCIUM 9.8   < > 9.2 9.2  MG 2.2  --   --   --    < > = values in this interval not displayed.   Liver Function Tests No results for input(s): AST, ALT, ALKPHOS, BILITOT, PROT, ALBUMIN in the last 72 hours. No results for input(s): LIPASE, AMYLASE in the last 72 hours. High Sensitivity Troponin:   No results for input(s): TROPONINIHS in the last 720 hours.  Recent Labs  Lab 10/20/24 1618 10/20/24 1925  10/20/24 2250 10/22/24 1025 10/22/24 1158  TRNPT 18 22* 22* 21* 21*    BNP Invalid input(s): POCBNP No results for input(s): PROBNP in the last 72 hours.  No results for input(s): BNP in the last 72 hours.  D-Dimer No results for input(s): DDIMER in the last 72 hours. Hemoglobin A1C No results for input(s): HGBA1C in the last 72 hours. Fasting Lipid Panel No results for input(s): CHOL, HDL, LDLCALC, TRIG, CHOLHDL, LDLDIRECT in the last 72 hours.  Lipoprotein (a)  Date/Time Value Ref Range Status  10/23/2024 04:19 AM 253.4 (H) <75.0 nmol/L Final    Comment:    (NOTE) This test was developed and its performance characteristics determined by [facility]. It has not been cleared or approved by the Food and Drug Administration. Note:  Values greater than or equal to 75.0 nmol/L may       indicate an independent risk factor for CHD,       but must be evaluated with caution when applied       to non-Caucasian populations due to the       influence of genetic factors on Lp(a) across       ethnicities. Performed At: [facility] [address] [place], [place] [id number] Jennette Shorter MD Ey:[id number]     Thyroid Function Tests Recent Labs    10/22/24 1025  TSH 0.538   _____________  DG Chest 2 View Result Date: 10/24/2024 CLINICAL DATA:  ICD in place. EXAM: CHEST - 2 VIEW COMPARISON:  Radiograph 10/20/2024 FINDINGS: Dual lead left-sided pacemaker with lead tips overlying the right atrium and ventricle. No pneumothorax. Lung volumes are low. Upper normal heart size likely accentuated by technique. No pulmonary edema, focal airspace disease or pleural effusion. Stable osseous structures. IMPRESSION: Dual lead left-sided pacemaker with lead tips overlying the right atrium and ventricle. No pneumothorax. Electronically Signed   By: Andrea Gasman M.D.   On: 10/24/2024 10:42   EP PPM/ICD IMPLANT Result Date: 10/24/2024 Summary: 1. Successful implant of a  LEFT sided Abbott DC ICD Recommendations: 1. Routine post-procedure care with bedrest for 2 hours 2. No heparin  (IV or subcutaneous) for 48 hours. No enoxaparin  (IV or subcutaneous) for 7 days. 3. PA/lateral CXR in AM 4. Wound check in AM 5. Device interrogation in AM Donnice DELENA Primus, MD [facility] Cardiac Electrophysiology    MR CARDIAC MORPHOLOGY W WO CONTRAST Result Date: 10/23/2024 CLINICAL DATA:  Ventricular tachycardia (VT), nonsustained EXAM: MR  CARDIA MORPHOLOGY WITHOUT AND WITH CONTRAST; MR CARDIAC VELOCITY FLOW MAPPING TECHNIQUE: The patient was scanned on a 1.5 Tesla Siemens magnet. A dedicated cardiac coil was used. Functional imaging was done using TrueFisp sequences. 2,3, and 4 chamber views were done to assess for RWMA's. Modified Simpson's rule using a short axis stack was used to calculate an ejection fraction on a dedicated work [employer]. The patient received 13mL GADAVIST  GADOBUTROL  1 MMOL/ML IV SOLN. After 10 minutes inversion recovery sequences were used to assess for infiltration and scar tissue. Phase contrast velocity encoded images obtained x 2. This examination is tailored for evaluation cardiac anatomy and function and provides very limited assessment of noncardiac structures, which are accordingly not evaluated during interpretation. If there is clinical concern for extracardiac pathology, further evaluation with CT imaging should be considered. FINDINGS: 1. The left ventricle is normal in cavity size with mild concentric LVH. The LV demonstrates global hypokinesis with mildly reduced systolic function. The LVEF = 40%. 2. The right ventricle is normal in cavity size and wall thickness. The RV systolic function is mildly reduced without focal wall motion abnormalities or aneurysms. The RVEF = 47%. 3. The right atrium is mildly dilated. The left atrium is normal in size. The interatrial septum bows into the left atrium during systole suggestive  RV/RA pressure overload. 4. The aortic valve is trileaflet in morphology. There is no significant aortic valve stenosis. There is mild aortic regurgitation. There is mild mitral regurgitation and trivial pulmonic regurgitation. 5. Delayed enhancement imaging is mildly abnormal. There is mild enhancement at the RV insertion points, which can be seen in the setting of elevated pulmonary pressures. 6. There is no pericardial effusion. The thickness of the pericardium is normal. 7. The proximal aorta is normal in size and caliber. 8. No notable extracardiac findings. CONCLUSIONS: Mild systolic dysfunction with non-specific late gadolinium enhancement at the RV insertion points consistent with increased pulmonary pressures. No significant myocardial enhancement to suggest an infiltrative process or infarct. MEASUREMENTS: LEFT VENTRICLE Left ventricular end diastolic chamber size: 4.5 cm. Left ventricular septal wall thickness: 1.1 cm. Left ventricular posterior wall thickness: 1.0 cm. Left ventricular maximal wall thickness: 1.2 cm. LV male LV EF: 40% (normal 49-79%) Absolute volumes: LV EDV: (normal 95-215 mL) LV ESV: 88mL (normal 25-85 mL) LV SV: 58mL (normal 61-145 mL) CO: 3.8L/min (normal 3.4-7.8 L/min) Indexed volumes: CI: 1.7L/min/sq-m (normal 1.8-4.2 L/min/sq-m) LV EDV: 57mL/sq-m (normal 50-108 mL/sq-m) LV ESV: 64mL/sq-m (normal 11-47 mL/sq-m) LV SV: 71mL/sq-m (normal 33-72 mL/sq-m) RIGHT VENTRICLE Normal right ventricular chamber size. Normal right ventricular wall thickness. RV male RV EF:  47% (normal 51-80%) Absolute volumes: RV EDV: (normal 109-217 mL) RV ESV: 56mL (normal 23-91 mL) RV SV: 50mL (normal 71-141 mL) CO: 3.3L/min (normal 2.8-8.8 L/min) Indexed volumes: CI: 1.5L/min/sq-m (normal 1.7-4.2 L/min/sq-m) RV EDV: 88mL/sq-m (normal 58-109 mL/sq-m) RV ESV: 55mL/sq-m (normal 12-46 mL/sq-m) RV SV: 55mL/sq-m (normal 38-71 mL/sq-m) ATRIA Left atrium: Normal size and morphology Right atrium: Mildly  enlarged with normal morphology. The interatrial septum bows towards the left atrium during systole suggestive significant RV/RA pressure overload. VALVES Aortic valve: Tricuspid with mild aortic regurgitation. No aortic stenosis. Mitral valve: Normal morphology with mild mitral regurgitation. Tricuspid valve: Normal morphology with significant regurgitation. Pulmonic valve: Normal morphology with trivial pulmonic regurgitation. PERICARDIUM Normal pericardium.  No pericardial effusion. OTHER No significant extracardiac findings. Electronically Signed   By: Georganna Archer   On: 10/23/2024 20:59   MR CARDIAC VELOCITY FLOW MAP Result  Date: 10/23/2024 CLINICAL DATA:  Ventricular tachycardia (VT), nonsustained EXAM: MR CARDIA MORPHOLOGY WITHOUT AND WITH CONTRAST; MR CARDIAC VELOCITY FLOW MAPPING TECHNIQUE: The patient was scanned on a 1.5 Tesla Siemens magnet. A dedicated cardiac coil was used. Functional imaging was done using TrueFisp sequences. 2,3, and 4 chamber views were done to assess for RWMA's. Modified Simpson's rule using a short axis stack was used to calculate an ejection fraction on a dedicated work [employer]. The patient received 13mL GADAVIST  GADOBUTROL  1 MMOL/ML IV SOLN. After 10 minutes inversion recovery sequences were used to assess for infiltration and scar tissue. Phase contrast velocity encoded images obtained x 2. This examination is tailored for evaluation cardiac anatomy and function and provides very limited assessment of noncardiac structures, which are accordingly not evaluated during interpretation. If there is clinical concern for extracardiac pathology, further evaluation with CT imaging should be considered. FINDINGS: 1. The left ventricle is normal in cavity size with mild concentric LVH. The LV demonstrates global hypokinesis with mildly reduced systolic function. The LVEF = 40%. 2. The right ventricle is normal in cavity size and wall thickness. The RV systolic  function is mildly reduced without focal wall motion abnormalities or aneurysms. The RVEF = 47%. 3. The right atrium is mildly dilated. The left atrium is normal in size. The interatrial septum bows into the left atrium during systole suggestive RV/RA pressure overload. 4. The aortic valve is trileaflet in morphology. There is no significant aortic valve stenosis. There is mild aortic regurgitation. There is mild mitral regurgitation and trivial pulmonic regurgitation. 5. Delayed enhancement imaging is mildly abnormal. There is mild enhancement at the RV insertion points, which can be seen in the setting of elevated pulmonary pressures. 6. There is no pericardial effusion. The thickness of the pericardium is normal. 7. The proximal aorta is normal in size and caliber. 8. No notable extracardiac findings. CONCLUSIONS: Mild systolic dysfunction with non-specific late gadolinium enhancement at the RV insertion points consistent with increased pulmonary pressures. No significant myocardial enhancement to suggest an infiltrative process or infarct. MEASUREMENTS: LEFT VENTRICLE Left ventricular end diastolic chamber size: 4.5 cm. Left ventricular septal wall thickness: 1.1 cm. Left ventricular posterior wall thickness: 1.0 cm. Left ventricular maximal wall thickness: 1.2 cm. LV male LV EF: 40% (normal 49-79%) Absolute volumes: LV EDV: (normal 95-215 mL) LV ESV: 88mL (normal 25-85 mL) LV SV: 58mL (normal 61-145 mL) CO: 3.8L/min (normal 3.4-7.8 L/min) Indexed volumes: CI: 1.7L/min/sq-m (normal 1.8-4.2 L/min/sq-m) LV EDV: 98mL/sq-m (normal 50-108 mL/sq-m) LV ESV: 65mL/sq-m (normal 11-47 mL/sq-m) LV SV: 60mL/sq-m (normal 33-72 mL/sq-m) RIGHT VENTRICLE Normal right ventricular chamber size. Normal right ventricular wall thickness. RV male RV EF:  47% (normal 51-80%) Absolute volumes: RV EDV: (normal 109-217 mL) RV ESV: 56mL (normal 23-91 mL) RV SV: 50mL (normal 71-141 mL) CO: 3.3L/min (normal 2.8-8.8 L/min) Indexed  volumes: CI: 1.5L/min/sq-m (normal 1.7-4.2 L/min/sq-m) RV EDV: 31mL/sq-m (normal 58-109 mL/sq-m) RV ESV: 43mL/sq-m (normal 12-46 mL/sq-m) RV SV: 10mL/sq-m (normal 38-71 mL/sq-m) ATRIA Left atrium: Normal size and morphology Right atrium: Mildly enlarged with normal morphology. The interatrial septum bows towards the left atrium during systole suggestive significant RV/RA pressure overload. VALVES Aortic valve: Tricuspid with mild aortic regurgitation. No aortic stenosis. Mitral valve: Normal morphology with mild mitral regurgitation. Tricuspid valve: Normal morphology with significant regurgitation. Pulmonic valve: Normal morphology with trivial pulmonic regurgitation. PERICARDIUM Normal pericardium.  No pericardial effusion. OTHER No significant extracardiac findings. Electronically Signed   By: Georganna Archer  On: 10/23/2024 20:59   MR CARDIAC VELOCITY FLOW MAP Result Date: 10/23/2024 CLINICAL DATA:  Ventricular tachycardia (VT), nonsustained EXAM: MR CARDIA MORPHOLOGY WITHOUT AND WITH CONTRAST; MR CARDIAC VELOCITY FLOW MAPPING TECHNIQUE: The patient was scanned on a 1.5 Tesla Siemens magnet. A dedicated cardiac coil was used. Functional imaging was done using TrueFisp sequences. 2,3, and 4 chamber views were done to assess for RWMA's. Modified Simpson's rule using a short axis stack was used to calculate an ejection fraction on a dedicated work [employer]. The patient received 13mL GADAVIST  GADOBUTROL  1 MMOL/ML IV SOLN. After 10 minutes inversion recovery sequences were used to assess for infiltration and scar tissue. Phase contrast velocity encoded images obtained x 2. This examination is tailored for evaluation cardiac anatomy and function and provides very limited assessment of noncardiac structures, which are accordingly not evaluated during interpretation. If there is clinical concern for extracardiac pathology, further evaluation with CT imaging should be considered. FINDINGS: 1.  The left ventricle is normal in cavity size with mild concentric LVH. The LV demonstrates global hypokinesis with mildly reduced systolic function. The LVEF = 40%. 2. The right ventricle is normal in cavity size and wall thickness. The RV systolic function is mildly reduced without focal wall motion abnormalities or aneurysms. The RVEF = 47%. 3. The right atrium is mildly dilated. The left atrium is normal in size. The interatrial septum bows into the left atrium during systole suggestive RV/RA pressure overload. 4. The aortic valve is trileaflet in morphology. There is no significant aortic valve stenosis. There is mild aortic regurgitation. There is mild mitral regurgitation and trivial pulmonic regurgitation. 5. Delayed enhancement imaging is mildly abnormal. There is mild enhancement at the RV insertion points, which can be seen in the setting of elevated pulmonary pressures. 6. There is no pericardial effusion. The thickness of the pericardium is normal. 7. The proximal aorta is normal in size and caliber. 8. No notable extracardiac findings. CONCLUSIONS: Mild systolic dysfunction with non-specific late gadolinium enhancement at the RV insertion points consistent with increased pulmonary pressures. No significant myocardial enhancement to suggest an infiltrative process or infarct. MEASUREMENTS: LEFT VENTRICLE Left ventricular end diastolic chamber size: 4.5 cm. Left ventricular septal wall thickness: 1.1 cm. Left ventricular posterior wall thickness: 1.0 cm. Left ventricular maximal wall thickness: 1.2 cm. LV male LV EF: 40% (normal 49-79%) Absolute volumes: LV EDV: (normal 95-215 mL) LV ESV: 88mL (normal 25-85 mL) LV SV: 58mL (normal 61-145 mL) CO: 3.8L/min (normal 3.4-7.8 L/min) Indexed volumes: CI: 1.7L/min/sq-m (normal 1.8-4.2 L/min/sq-m) LV EDV: 43mL/sq-m (normal 50-108 mL/sq-m) LV ESV: 16mL/sq-m (normal 11-47 mL/sq-m) LV SV: 57mL/sq-m (normal 33-72 mL/sq-m) RIGHT VENTRICLE Normal right ventricular  chamber size. Normal right ventricular wall thickness. RV male RV EF:  47% (normal 51-80%) Absolute volumes: RV EDV: (normal 109-217 mL) RV ESV: 56mL (normal 23-91 mL) RV SV: 50mL (normal 71-141 mL) CO: 3.3L/min (normal 2.8-8.8 L/min) Indexed volumes: CI: 1.5L/min/sq-m (normal 1.7-4.2 L/min/sq-m) RV EDV: 2mL/sq-m (normal 58-109 mL/sq-m) RV ESV: 53mL/sq-m (normal 12-46 mL/sq-m) RV SV: 65mL/sq-m (normal 38-71 mL/sq-m) ATRIA Left atrium: Normal size and morphology Right atrium: Mildly enlarged with normal morphology. The interatrial septum bows towards the left atrium during systole suggestive significant RV/RA pressure overload. VALVES Aortic valve: Tricuspid with mild aortic regurgitation. No aortic stenosis. Mitral valve: Normal morphology with mild mitral regurgitation. Tricuspid valve: Normal morphology with significant regurgitation. Pulmonic valve: Normal morphology with trivial pulmonic regurgitation. PERICARDIUM Normal pericardium.  No pericardial effusion. OTHER No significant extracardiac  findings. Electronically Signed   By: Georganna Archer   On: 10/23/2024 20:59   CARDIAC CATHETERIZATION Result Date: 10/22/2024   There is mild to moderate left ventricular systolic dysfunction.   LV end diastolic pressure is normal.   The left ventricular ejection fraction is 45-50% by visual estimate. Normal coronary anatomy Mild LV dysfunction. EF 45% Normal LVEDP 6 mm Hg Plan: EP to evaluate for management of arrhythmia  CT Angio Chest Pulmonary Embolism (PE) W or WO Contrast Result Date: 10/21/2024 CLINICAL DATA:  Chest pain.  Concern for pulmonary embolism. EXAM: CT ANGIOGRAPHY CHEST WITH CONTRAST TECHNIQUE: Multidetector CT imaging of the chest was performed using the standard protocol during bolus administration of intravenous contrast. Multiplanar CT image reconstructions and MIPs were obtained to evaluate the vascular anatomy. RADIATION DOSE REDUCTION: This exam was performed according to the  departmental dose-optimization program which includes automated exposure control, adjustment of the mA and/or kV according to patient size and/or use of iterative reconstruction technique. CONTRAST:  75mL OMNIPAQUE  IOHEXOL  350 MG/ML SOLN COMPARISON:  Chest radiograph dated 10/20/2024 and CT dated 01/01/2023. FINDINGS: Cardiovascular: There is no cardiomegaly or pericardial effusion. The thoracic aorta is unremarkable. No pulmonary artery embolus identified. Mediastinum/Nodes: No hilar or mediastinal adenopathy. The esophagus is grossly unremarkable. No mediastinal fluid collection. Lungs/Pleura: No focal consolidation, pleural effusion, pneumothorax. The central airways are patent. Upper Abdomen: No acute abnormality. Musculoskeletal: No acute osseous pathology. Review of the MIP images confirms the above findings. IMPRESSION: No acute intrathoracic pathology. No CT evidence of pulmonary artery embolus. Electronically Signed   By: Vanetta Chou M.D.   On: 10/21/2024 16:59   ECHOCARDIOGRAM COMPLETE Result Date: 10/21/2024    ECHOCARDIOGRAM REPORT   Patient Name:   Broden Holt. Date of Exam: 10/21/2024 Medical Rec #:  [id number]          Height:       71.0 in Accession #:    [id number]         Weight:       222.2 lb Date of Birth:  1966/10/04         BSA:          2.205 m Patient Age:    57 years           BP:           148/100 mmHg Patient Gender: M                  HR:           64 bpm. Exam Location:  [facility] Procedure: 2D Echo, Cardiac Doppler, Color Doppler and Strain Analysis (Both            Spectral and Color Flow Doppler were utilized during procedure). Indications:    Chest Pain R07.9  History:        Patient has prior history of Echocardiogram examinations, most                 recent 06/15/2024. Cardiomyopathy, CAD, Signs/Symptoms:Chest                 Pain; Risk Factors:Former Smoker.  Sonographer:    BERNARDA ROCKS Referring Phys: FERNLEA.FOREMAN CLANFORD L JOHNSON IMPRESSIONS  1. Left ventricular  ejection fraction, by estimation, is 45 to 50%. The left ventricle has mildly decreased function. The left ventricle demonstrates global hypokinesis. There is mild concentric left ventricular hypertrophy. Left ventricular diastolic parameters are consistent with Grade I diastolic dysfunction (impaired relaxation).  The average left ventricular global longitudinal strain is -14.3 %. The global longitudinal strain is abnormal.  2. Right ventricular systolic function is normal. The right ventricular size is normal. Tricuspid regurgitation signal is inadequate for assessing PA pressure.  3. The mitral valve is grossly normal. Mild mitral valve regurgitation.  4. The aortic valve is tricuspid. Aortic valve regurgitation is mild. No aortic stenosis is present. Aortic valve mean gradient measures 2.0 mmHg.  5. Aortic dilatation noted. There is mild dilatation of the ascending aorta, measuring 41 mm.  6. The inferior vena cava is normal in size with greater than 50% respiratory variability, suggesting right atrial pressure of 3 mmHg. Comparison(s): Prior images reviewed side by side. LVEF 45-50%, similar to prior study including GLS. Mildly dilated ascending aorta. Mild aortic regurgitation. FINDINGS  Left Ventricle: Left ventricular ejection fraction, by estimation, is 45 to 50%. The left ventricle has mildly decreased function. The left ventricle demonstrates global hypokinesis. The average left ventricular global longitudinal strain is -14.3 %. Strain was performed and the global longitudinal strain is abnormal. The left ventricular internal cavity size was normal in size. There is mild concentric left ventricular hypertrophy. Left ventricular diastolic parameters are consistent with Grade I diastolic dysfunction (impaired relaxation). Right Ventricle: The right ventricular size is normal. No increase in right ventricular wall thickness. Right ventricular systolic function is normal. Tricuspid regurgitation signal is  inadequate for assessing PA pressure. Left Atrium: Left atrial size was normal in size. Right Atrium: Right atrial size was normal in size. Pericardium: There is no evidence of pericardial effusion. Mitral Valve: The mitral valve is grossly normal. Mild mitral valve regurgitation. MV peak gradient, 1.5 mmHg. The mean mitral valve gradient is 1.0 mmHg. Tricuspid Valve: The tricuspid valve is grossly normal. Tricuspid valve regurgitation is trivial. Aortic Valve: The aortic valve is tricuspid. Aortic valve regurgitation is mild. Aortic regurgitation PHT measures 816 msec. No aortic stenosis is present. Aortic valve mean gradient measures 2.0 mmHg. Aortic valve peak gradient measures 3.7 mmHg. Aortic  valve area, by VTI measures 3.78 cm. Pulmonic Valve: The pulmonic valve was grossly normal. Pulmonic valve regurgitation is trivial. Aorta: Aortic dilatation noted. There is mild dilatation of the ascending aorta, measuring 41 mm. Venous: The inferior vena cava is normal in size with greater than 50% respiratory variability, suggesting right atrial pressure of 3 mmHg. IAS/Shunts: No atrial level shunt detected by color flow Doppler. Additional Comments: 3D was performed not requiring image post processing on an independent workstation and was indeterminate.  LEFT VENTRICLE PLAX 2D LVIDd:         5.30 cm      Diastology LVIDs:         4.30 cm      LV e' medial:    5.22 cm/s LV PW:         1.20 cm      LV E/e' medial:  8.4 LV IVS:        1.10 cm      LV e' lateral:   8.27 cm/s LVOT diam:     2.50 cm      LV E/e' lateral: 5.3 LV SV:         66 LV SV Index:   30           2D Longitudinal Strain LVOT Area:     4.91 cm     2D Strain GLS Avg:     -14.3 %  LV Volumes (MOD) LV vol d, MOD A2C:  117.0 ml LV vol d, MOD A4C: 153.0 ml LV vol s, MOD A2C: 65.8 ml LV vol s, MOD A4C: 79.0 ml LV SV MOD A2C:     51.2 ml LV SV MOD A4C:     153.0 ml LV SV MOD BP:      61.0 ml RIGHT VENTRICLE             IVC RV Basal diam:  3.50 cm     IVC  diam: 1.40 cm RV S prime:     11.70 cm/s TAPSE (M-mode): 1.7 cm LEFT ATRIUM             Index        RIGHT ATRIUM           Index LA diam:        3.70 cm 1.68 cm/m   RA Area:     14.90 cm LA Vol (A2C):   49.6 ml 22.49 ml/m  RA Volume:   33.30 ml  15.10 ml/m LA Vol (A4C):   35.2 ml 15.96 ml/m LA Biplane Vol: 42.3 ml 19.18 ml/m  AORTIC VALVE                    PULMONIC VALVE AV Area (Vmax):    3.72 cm     PV Vmax:          0.93 m/s AV Area (Vmean):   3.53 cm     PV Peak grad:     3.5 mmHg AV Area (VTI):     3.78 cm     PR End Diast Vel: 2.32 msec AV Vmax:           96.80 cm/s AV Vmean:          56.800 cm/s AV VTI:            0.174 m AV Peak Grad:      3.7 mmHg AV Mean Grad:      2.0 mmHg LVOT Vmax:         73.40 cm/s LVOT Vmean:        40.800 cm/s LVOT VTI:          0.134 m LVOT/AV VTI ratio: 0.77 AI PHT:            816 msec  AORTA Ao Root diam: 3.50 cm Ao Asc diam:  4.10 cm MITRAL VALVE MV Area (PHT): 3.12 cm    SHUNTS MV Area VTI:   3.36 cm    Systemic VTI:  0.13 m MV Peak grad:  1.5 mmHg    Systemic Diam: 2.50 cm MV Mean grad:  1.0 mmHg MV Vmax:       0.61 m/s MV Vmean:      35.6 cm/s MV Decel Time: 243 msec MR Peak grad: 43.0 mmHg MR Vmax:      328.00 cm/s MV E velocity: 44.00 cm/s MV A velocity: 63.90 cm/s MV E/A ratio:  0.69 Jayson Sierras MD Electronically signed by Jayson Sierras MD Signature Date/Time: 10/21/2024/3:18:44 PM    Final    MR BRAIN WO CONTRAST Result Date: 10/21/2024 CLINICAL DATA:  Dizziness EXAM: MRI HEAD WITHOUT CONTRAST TECHNIQUE: Multiplanar, multiecho pulse sequences of the brain and surrounding structures were obtained without intravenous contrast. COMPARISON:  None Available. FINDINGS: MRI brain: The brain volume is normal. The signal in the brain parenchyma is normal. There is no acute or chronic infarct. The ventricles are normal. No mass lesion. There are normal flow signals in the carotid  arteries and basilar artery. No significant bone marrow signal abnormality. No  significant abnormality in the paranasal sinuses or soft tissues. IMPRESSION: Normal Electronically Signed   By: Nancyann Burns M.D.   On: 10/21/2024 09:42   DG Chest 2 View Result Date: 10/20/2024 EXAM: 2 VIEW(S) XRAY OF THE CHEST 10/20/2024 03:18:00 PM COMPARISON: Chest x-ray 10/23/2023. CLINICAL HISTORY: CP. FINDINGS: LUNGS AND PLEURA: No focal pulmonary opacity. No pulmonary edema. No pleural effusion. No pneumothorax. HEART AND MEDIASTINUM: No acute abnormality of the cardiac and mediastinal silhouettes. BONES AND SOFT TISSUES: No acute osseous abnormality. IMPRESSION: 1. No acute process. Electronically signed by: Greig Pique MD 10/20/2024 03:50 PM EST RP Workstation: [id number]    Disposition Pt is being discharged home today in good condition.  Follow-up Plans & Appointments  Follow-up Information     [facility]. Go on 10/30/2024.   Specialty: Cardiology Why: Hospital Follow-Up 10/30/2024 @ 3:30 PM Please bring all medications to follow-up appointment [facility] off of [address] [employer] Parking at the door or use gate code [phone] to park under the [employer] information: [address] [place]  [phone]        [facility] at [facility] A Dept of [employer]. [facility] Follow up on 11/05/2024.   Specialty: Cardiology Why: Device clinic on 5th floor. @12 :00PM. Wound check Contact information: [address] [place]  [address] [phone]        Aniceto Daphne CROME, NP Follow up on 01/25/2025.   Specialty: Cardiology Why: 11:20AM. Electrophysiology follow up Contact information: [address] [place] [address] [phone]                Discharge Instructions     Diet - low sodium heart healthy   Complete by: As directed    Discharge wound care:   Complete by: As directed    See separate instruction    Increase activity slowly   Complete by: As directed        Discharge Medications Allergies as of 10/25/2024       Reactions   Other Other (See Comments)   Pet dander, pollen -> eyes itchy/watery, sneezing   Shellfish Allergy Itching   Wound Dressing Adhesive Other (See Comments)   Blisters         Medication List     STOP taking these medications    eplerenone 25 MG tablet Commonly known as: INSPRA       TAKE these medications    allopurinol  100 MG tablet Commonly known as: ZYLOPRIM  Take 100 mg by mouth daily.   amiodarone 200 MG tablet Commonly known as: PACERONE Take 2 tablets (400mg  total) twice a day for 14 days, then 1 tablet (200mg  daily) thereafter   empagliflozin  10 MG Tabs tablet Commonly known as: Jardiance  Take 1 tablet (10 mg total) by mouth daily before breakfast.   Entresto  24-26 MG Generic drug: sacubitril -valsartan  Take 1 tablet by mouth 2 (two) times daily.   ferrous sulfate 325 (65 FE) MG EC tablet Take 325 mg by mouth every Monday, Wednesday, and Friday.   ketotifen 0.035 % ophthalmic solution Commonly known as: ZADITOR Place 2 drops into both eyes daily.   levothyroxine  200 MCG tablet Commonly known as: SYNTHROID  Take 200 mcg by mouth daily before breakfast.   metoprolol  succinate 50 MG 24 hr tablet Commonly known as: Toprol  XL Take 1 tablet (50 mg  total) by mouth at bedtime.   omeprazole 20 MG capsule Commonly known as: PRILOSEC Take 20 mg by mouth daily.   spironolactone  25 MG tablet Commonly known as: ALDACTONE  Take 1 tablet (25 mg total) by mouth daily.               Discharge Care Instructions  (From admission, onward)           Start     Ordered   10/25/24 0000  Discharge wound care:       Comments: See separate instruction   10/25/24 1014             Outstanding Labs/Studies  CXR, LFT and TSH/Free T4 on follow up    Signed, Scot Ford, PA 10/25/2024, 10:14 AM  I have seen, examined the  patient, and reviewed the above assessment and plan.    Hospital Course:  Mr. Ellerby is a 58 year old male with chronic systolic heart failure secondary to nonischemic cardiomyopathy who presented with palpitations and near syncope in the setting of stained monomorphic VT.  He underwent left heart catheterization which showed no significant coronary artery disease.  He had cardiac MRI which showed an LVEF of 40%, mild concentric LVH and mildly abnormal delayed enhancement imaging.  He underwent dual-chamber ICD implantation on 10/23/2024 for secondary prevention in setting of sustained monomorphic VT and nonischemic cardiomyopathy.  He was loaded on IV amiodarone and then transition to oral without recurrence of VT.    General: Well developed, in no acute distress.  Neck: No JVD.  Cardiac: Normal rate, regular rhythm. Left chest ICD pocket without bleeding or hematoma. Resp: Normal work of breathing.  Ext: No edema.  Neuro: No gross focal deficits.  Psych: Normal affect.   Assessment and Plan:  #. S/p ICD #. Sustained MMVT  - Checks x-ray with appropriate lead positions and no pneumothorax. - Device interrogation performed yesterday. Appropriate device function and stable lead parameters.  There were some episodes of PMT, so PMT detection rhythm was dropped to 100 bpm. - Usual post implant instructions regarding activity restrictions and wound care were provided. - Follow-up in device clinic in 10 to 14 days. - Continue amiodarone 400mg  BID for 14 days then transition to 200mg  once daily.   #. Chronic systolic heart failure: Well compensated on exam. #. NICM -GDMT regimen of spironolactone , Entresto  here.  Resume home metoprolol  XL 50mg  daily and empagliflozin  10mg  daily.  Duration of discharge encounter -5 minutes.    Fonda Kitty, MD 10/25/2024 12:35 PM

## 2024-10-24 NOTE — Op Note (Addendum)
   Procedure:  LEFT sided Abbott Dual Chamber ICD Implant  Pre-op Diagnosis: sustained VT, 2' prevention VT/VF    Post-op Diagnosis: same  Procedure Date: 10/23/24  Attending: Adina Primus, MD  Anesthesia: monitored anesthesia care   Procedure Report:  The LEFT shoulder was prepped with chlorhexidine scrub. 50 cc lidocaine  was injected at the planned incision site. A pocket was created with electrocautery and blunt dissection.    A venogram was performed which demonstrated a patent axillary vein. The LEFT axillary vein was accessed using a micropuncture needle under ultrasound guidance. A wire was passed into the IVC. Retained access was used for placement of the second wire with difficult to access axillary vein.   An 8 Fr sheath was advanced and the dilator removed. The RV ICD lead was inserted into the sheath and the lead was then advanced into the RVOT. After exchange of pigtail shaped stylet for a curved stylet, the lead was retracted into the RV cavity and advanced to the RV apical septum. Two view flouroscopy then confirmed RV apical, septal position. The helix was extended and the lead fixation was tested with lead tension. Interrogation revealed a current of injury with acceptable lead parameters (see below). The sheath was split and removed and the RV ICD lead was firmly attached to the pre-pectoralis fascia with two 0-silk sutures.   A 7 Fr sheath was placed over the second wire. The RA lead was subsequently advanced over a straight stylet into the inferior RA. A curved stylet was then used to affix the RA lead in the right atrial appendage. The active fixation screw was deployed, and interrogation revealed a current of injury with acceptable lead parameters (see below). The peel away sheath was removed and the lead was affixed to the fascia using two 0-silk sutures.   The device was then connected to the leads. The pocket was irrigated with gentamicin solution. The pocket was  closed with continuous 2-0 Vicryl, 2-0 V-Loc and 3-0 V-Loc. Dermabond was applied over the incision.   The device was evaluated by the representative of the cardiac device manufacturer with implant parameters listed below.   Complications: none Estimated blood loss: less than 50 mL  Implanted Hardware: Implant Name Type Inv. Item Serial No. Manufacturer Lot No. LRB No. Used Action  LEAD DURATA 7122Q-65CM - [id number] Lead LEAD DURATA 7122Q-65CM [id number] [employer]  N/A 1 Implanted  LEAD ULTIPACE 52 OEJ8768/47 - [id number] Lead LEAD ULTIPACE 52 [id number]/52 [id number] [employer]  N/A 1 Implanted  ICD GALLANT DR RIIMJ499V - [id number] ICD Generator ICD GALLANT DR [id number] [employer]  N/A 1 Implanted   Device Information: ICD Generator: [place] DR [id number], [place] [id number], DOI 10/23/24 RA: Abbott Ultipace LPA 1231/52, SN [id number], DOI 10/23/24 RV: Abbott Durata 7122Q-65 cm, SN [id number], DOI 10/23/24  Lead Interrogation Data: RA: 1.0 (AFL) mV / 580 ohms / not tested (AFL) RV: >12.0 mV / 760 ohms / 0/75 V @ 0.5 ms Shock Impedance: 64 ohms  Final Device Settings: DDD 60/120 PAV 200 SAV 200   Summary: 1. Successful implant of a LEFT sided Abbott DC ICD   Recommendations: Routine post-procedure care with bedrest for 2 hours No heparin  (IV or subcutaneous) for 48 hours. No enoxaparin  (IV or subcutaneous) for 7 days.  PA/lateral CXR in AM Wound check in AM Device interrogation in AM  Elijah DELENA Primus, MD [facility]  Cardiac Electrophysiology

## 2024-10-24 NOTE — Progress Notes (Signed)
  Progress Note  Patient Name: Elijah Perez. Date of Encounter: 10/24/2024 [facility] Cardiologist: Alvan Carrier, MD   Interval Summary   Dual chamber ICD implant yesterday. No acute overnight events. Patient reports feeling relatively well. No new or acute complaints.   Vital Signs Vitals:   10/23/24 2333 10/24/24 0517 10/24/24 0529 10/24/24 0805  BP: (!) 121/90  (!) 134/99 102/88  Pulse: 78  66 64  Resp: 18  19 18   Temp: 98.5 F (36.9 C)  97.9 F (36.6 C) 98.1 F (36.7 C)  TempSrc: Oral  Oral Oral  SpO2: 93%  100% 95%  Weight:  98.8 kg    Height:       No intake or output data in the 24 hours ending 10/24/24 1001    10/24/2024    5:17 AM 10/23/2024    4:22 AM 10/22/2024   10:18 AM  Last 3 Weights  Weight (lbs) 217 lb 14.4 oz 215 lb 1.6 oz 212 lb 1.3 oz  Weight (kg) 98.839 kg 97.569 kg 96.2 kg      Telemetry/ECG  Sinus rhythm, occasional PVCs, no VAs - Personally Reviewed  Physical Exam  General: Well developed, in no acute distress.  Neck: No JVD.  Cardiac: Normal rate, regular rhythm. Left chest ICD pocket without bleeding or hematoma. Resp: Normal work of breathing.  Ext: No edema.  Neuro: No gross focal deficits.  Psych: Normal affect.   Cardiac MRI CONCLUSIONS: Mild systolic dysfunction with non-specific late gadolinium enhancement at the RV insertion points consistent with increased pulmonary pressures. No significant myocardial enhancement to suggest an infiltrative process or infarct.  LHC   There is mild to moderate left ventricular systolic dysfunction.   LV end diastolic pressure is normal.   The left ventricular ejection fraction is 45-50% by visual estimate.   Normal coronary anatomy Mild LV dysfunction. EF 45% Normal LVEDP 6 mm Hg    Assessment & Plan   #. S/p ICD #. Sustained MMVT  - Checks x-ray with appropriate lead positions.  Formal radiology read pending. - Device interrogation performed today.  Appropriate  device function and stable lead parameters.  There were some episodes of PMT, so PMT detection rhythm was dropped to 100 bpm. - Usual post implant instructions regarding activity restrictions and wound care were provided. - Follow-up in device clinic in 10 to 14 days. - Will transition to oral amiodarone 400mg  BID and watch for 24 hours.  Hopefully discharge tomorrow.  #. Chronic systolic heart failure:  #. NICM -GDMT regimen of spironolactone , Entresto  here.  Resume home metoprolol  XL 50mg  daily and empagliflozin  10mg  daily.  Signed, Fonda Kitty, MD

## 2024-10-24 NOTE — Progress Notes (Signed)
 CCMD called and reported 4 bts of non-sustained V-tach, followed by pace spike. It was then followed by 5 bts of V-tach.  The patient was asymptomatic.

## 2024-10-24 NOTE — Evaluation (Signed)
 Occupational Therapy Evaluation Patient Details Name: Elijah Perez. MRN: [id number] DOB: 07/16/1966 Today's Date: 10/24/2024   History of Present Illness   Pt is a 58 y/o male admitted to ED with Pressure in L chest with 1 episode sharp pain radiating down his left arm.  S/P  11/7 ICD placement.  PMHx  thyroid CA, Gout, CHF, nonischemic cardiomyopathy, GERD, Horner syndrome, thalassemia trait     Clinical Impressions Pt reported at PLOF they were ambulating with no DME and live with their wife who can assist as needed with the return to home. He was able to complete bed mobility with mod I, sit to stand transfers with CGA and ambulated with CGA to toilet to complete toileting tasks. He and daughters present in room went over education on completion of dressing/bathing/peri care with ICD placement and progression of LUE use. At this time acute Occupational Therapy to follow and recommendation for cardiac OP therapy.       If plan is discharge home, recommend the following:   A little help with walking and/or transfers;A little help with bathing/dressing/bathroom;Assistance with cooking/housework;Assist for transportation     Functional Status Assessment   Patient has had a recent decline in their functional status and demonstrates the ability to make significant improvements in function in a reasonable and predictable amount of time.     Equipment Recommendations   None recommended by OT     Recommendations for Other Services         Precautions/Restrictions   Precautions Precautions: Fall Recall of Precautions/Restrictions: Intact Restrictions Weight Bearing Restrictions Per Provider Order: Yes LUE Weight Bearing Per Provider Order: Non weight bearing Other Position/Activity Restrictions: ICD placement precuations, revviewed in session about timeline     Mobility Bed Mobility Overal bed mobility: Needs Assistance Bed Mobility: Supine to Sit, Sit to Supine      Supine to sit: Modified independent (Device/Increase time) Sit to supine: Modified independent (Device/Increase time)        Transfers Overall transfer level: Needs assistance Equipment used: None Transfers: Sit to/from Stand Sit to Stand: Contact guard assist           General transfer comment: intially reported feelin a little dizzy with standing      Balance Overall balance assessment: Needs assistance Sitting-balance support: No upper extremity supported, Feet supported Sitting balance-Leahy Scale: Good     Standing balance support: Single extremity supported, No upper extremity supported Standing balance-Leahy Scale: Fair Standing balance comment: slightly lighthead and gave HHA intially                           ADL either performed or assessed with clinical judgement   ADL Overall ADL's : Needs assistance/impaired Eating/Feeding: Set up;Minimal assistance;Sitting   Grooming: Wash/dry hands;Contact guard assist;Standing   Upper Body Bathing: Minimal assistance;Sitting   Lower Body Bathing: Minimal assistance;Sit to/from stand   Upper Body Dressing : Minimal assistance;Sitting   Lower Body Dressing: Minimal assistance;Sit to/from stand   Toilet Transfer: Contact guard assist;Ambulation   Toileting- Architect and Hygiene: Contact guard assist;Sit to/from stand       Functional mobility during ADLs: Contact guard assist       Vision         Perception         Praxis         Pertinent Vitals/Pain Pain Assessment Pain Assessment: Faces Faces Pain Scale: Hurts little more Pain Location: sx site  Pain Descriptors / Indicators: Grimacing Pain Intervention(s): Premedicated before session     Extremity/Trunk Assessment Upper Extremity Assessment Upper Extremity Assessment: LUE deficits/detail LUE Deficits / Details: limited due to PCI LUE Sensation: WNL LUE Coordination: decreased gross motor   Lower Extremity  Assessment Lower Extremity Assessment: Defer to PT evaluation   Cervical / Trunk Assessment Cervical / Trunk Assessment: Normal   Communication Communication Communication: No apparent difficulties   Cognition Arousal: Alert Behavior During Therapy: WFL for tasks assessed/performed Cognition: No apparent impairments                               Following commands: Intact       Cueing  General Comments   Cueing Techniques: Verbal cues      Exercises     Shoulder Instructions      Home Living Family/patient expects to be discharged to:: Private residence Living Arrangements: Spouse/significant other Available Help at Discharge: Family Type of Home: House Home Access: Level entry;Stairs to enter Entrance Stairs-Number of Steps: 2 Entrance Stairs-Rails: Left;Right Home Layout: One level     Bathroom Shower/Tub: Producer, Television/film/video: Standard     Home Equipment: Shower seat          Prior Functioning/Environment Prior Level of Function : Independent/Modified Independent             Mobility Comments: weight training [place] biking but not recently ADLs Comments: indep    OT Problem List: Decreased strength;Impaired balance (sitting and/or standing);Decreased activity tolerance;Decreased safety awareness;Decreased knowledge of use of DME or AE;Pain   OT Treatment/Interventions: Self-care/ADL training;Therapeutic exercise;DME and/or AE instruction;Therapeutic activities;Patient/family education;Balance training      OT Goals(Current goals can be found in the care plan section)   Acute Rehab OT Goals Patient Stated Goal: to go home tomorrow OT Goal Formulation: With patient Time For Goal Achievement: 11/07/24 Potential to Achieve Goals: Fair   OT Frequency:  Min 2X/week    Co-evaluation              AM-PAC OT 6 Clicks Daily Activity     Outcome Measure Help from another person eating meals?: A Little Help  from another person taking care of personal grooming?: A Little Help from another person toileting, which includes using toliet, bedpan, or urinal?: A Little Help from another person bathing (including washing, rinsing, drying)?: A Little Help from another person to put on and taking off regular upper body clothing?: A Little Help from another person to put on and taking off regular lower body clothing?: A Little 6 Click Score: 18   End of Session Equipment Utilized During Treatment: Gait belt Nurse Communication: Mobility status  Activity Tolerance: Patient tolerated treatment well Patient left: in bed;with call [place]/phone within reach;with family/visitor present  OT Visit Diagnosis: Unsteadiness on feet (R26.81);Other abnormalities of gait and mobility (R26.89);Repeated falls (R29.6);Muscle weakness (generalized) (M62.81);Pain Pain - Right/Left: Left Pain - part of body:  (sx site)                Time: 1510-1541 OT Time Calculation (min): 31 min Charges:  OT General Charges $OT Visit: 1 Visit OT Evaluation $OT Eval Low Complexity: 1 Low OT Treatments $Self Care/Home Management : 8-22 mins  Warrick POUR OTR/L  Acute Rehab Services  [phone] office number   Warrick Berber 10/24/2024, 3:58 PM

## 2024-10-24 NOTE — Plan of Care (Signed)
   Problem: Education: Goal: Knowledge of General Education information will improve Description Including pain rating scale, medication(s)/side effects and non-pharmacologic comfort measures Outcome: Progressing

## 2024-10-25 ENCOUNTER — Other Ambulatory Visit: Payer: Self-pay | Admitting: Physician Assistant

## 2024-10-25 LAB — BASIC METABOLIC PANEL WITH GFR
Anion gap: 9 (ref 5–15)
BUN: 12 mg/dL (ref 6–20)
CO2: 26 mmol/L (ref 22–32)
Calcium: 9.2 mg/dL (ref 8.9–10.3)
Chloride: 99 mmol/L (ref 98–111)
Creatinine, Ser: 1.33 mg/dL — ABNORMAL HIGH (ref 0.61–1.24)
GFR, Estimated: >60 mL/min (ref >60)
Glucose, Bld: 102 mg/dL — ABNORMAL HIGH (ref 70–99)
Potassium: 4.2 mmol/L (ref 3.5–5.1)
Sodium: 134 mmol/L — ABNORMAL LOW (ref 135–145)

## 2024-10-25 MED ORDER — AMIODARONE HCL 200 MG PO TABS: ORAL_TABLET | ORAL | 0 refills | Status: DC

## 2024-10-25 MED ORDER — AMIODARONE HCL 200 MG PO TABS: ORAL_TABLET | ORAL | 0 refills | Status: AC

## 2024-10-25 NOTE — Plan of Care (Signed)
  Problem: Education: Goal: Knowledge of General Education information will improve Description: Including pain rating scale, medication(s)/side effects and non-pharmacologic comfort measures Outcome: Adequate for Discharge   Problem: Health Behavior/Discharge Planning: Goal: Ability to manage health-related needs will improve Outcome: Adequate for Discharge   Problem: Clinical Measurements: Goal: Ability to maintain clinical measurements within normal limits will improve Outcome: Adequate for Discharge Goal: Will remain free from infection Outcome: Adequate for Discharge Goal: Diagnostic test results will improve Outcome: Adequate for Discharge Goal: Respiratory complications will improve Outcome: Adequate for Discharge Goal: Cardiovascular complication will be avoided Outcome: Adequate for Discharge   Problem: Activity: Goal: Risk for activity intolerance will decrease Outcome: Adequate for Discharge   Problem: Nutrition: Goal: Adequate nutrition will be maintained Outcome: Adequate for Discharge   Problem: Coping: Goal: Level of anxiety will decrease Outcome: Adequate for Discharge   Problem: Pain Managment: Goal: General experience of comfort will improve and/or be controlled Outcome: Adequate for Discharge   Problem: Skin Integrity: Goal: Risk for impaired skin integrity will decrease Outcome: Adequate for Discharge   Problem: Education: Goal: Understanding of cardiac disease, CV risk reduction, and recovery process will improve Outcome: Adequate for Discharge Goal: Individualized Educational Video(s) Outcome: Adequate for Discharge   Problem: Activity: Goal: Ability to tolerate increased activity will improve Outcome: Adequate for Discharge   Problem: Cardiac: Goal: Ability to achieve and maintain adequate cardiovascular perfusion will improve Outcome: Adequate for Discharge   Problem: Education: Goal: Understanding of CV disease, CV risk reduction, and  recovery process will improve Outcome: Adequate for Discharge Goal: Individualized Educational Video(s) Outcome: Adequate for Discharge

## 2024-10-25 NOTE — Progress Notes (Addendum)
  Progress Note  Patient Name: Elijah Perez. Date of Encounter: 10/25/2024 [facility] Cardiologist: Alvan Carrier, MD   Interval Summary   IV amiodarone discontinued yesterday and transitioned to oral. No acute overnight events. Patient reports feeling relatively well. Some mild pain at pacemaker site, primarily when laying on left side. Otherwise no new or acute complaints.   Vital Signs Vitals:   10/24/24 2018 10/25/24 0102 10/25/24 0352 10/25/24 0824  BP: (!) 134/92 124/86 (!) 130/93 (!) 141/89  Pulse: 60 62 63   Resp: 18 16 20 18   Temp: 98 F (36.7 C) 98 F (36.7 C) 97.9 F (36.6 C) 98.1 F (36.7 C)  TempSrc: Oral Oral Oral Oral  SpO2: 97% 98% 99% 96%  Weight:   99 kg   Height:        Intake/Output Summary (Last 24 hours) at 10/25/2024 0913 Last data filed at 10/25/2024 0841 Gross per 24 hour  Intake 364 ml  Output --  Net 364 ml      10/25/2024    3:52 AM 10/24/2024    5:17 AM 10/23/2024    4:22 AM  Last 3 Weights  Weight (lbs) 218 lb 3.2 oz 217 lb 14.4 oz 215 lb 1.6 oz  Weight (kg) 98.975 kg 98.839 kg 97.569 kg      Telemetry/ECG  Sinus rhythm, occasional PVCs, rare NSVT - Personally Reviewed  Physical Exam  General: Well developed, in no acute distress.  Neck: No JVD.  Cardiac: Normal rate, regular rhythm. Left chest ICD pocket without bleeding or hematoma. Resp: Normal work of breathing.  Ext: No edema.  Neuro: No gross focal deficits.  Psych: Normal affect.   Cardiac MRI CONCLUSIONS: Mild systolic dysfunction with non-specific late gadolinium enhancement at the RV insertion points consistent with increased pulmonary pressures. No significant myocardial enhancement to suggest an infiltrative process or infarct.  LHC   There is mild to moderate left ventricular systolic dysfunction.   LV end diastolic pressure is normal.   The left ventricular ejection fraction is 45-50% by visual estimate.   Normal coronary anatomy Mild LV  dysfunction. EF 45% Normal LVEDP 6 mm Hg    Assessment & Plan   #. S/p ICD #. Sustained MMVT  - Checks x-ray with appropriate lead positions and no pneumothorax. - Device interrogation performed yesterday. Appropriate device function and stable lead parameters.  There were some episodes of PMT, so PMT detection rhythm was dropped to 100 bpm. - Usual post implant instructions regarding activity restrictions and wound care were provided. - Follow-up in device clinic in 10 to 14 days. - Continue amiodarone 400mg  BID for 14 days then transition to 200mg  once daily.  #. Chronic systolic heart failure: Well compensated on exam. #. NICM -GDMT regimen of spironolactone , Entresto  here.  Resume home metoprolol  XL 50mg  daily and empagliflozin  10mg  daily.  Discharge today.   Signed, Fonda Kitty, MD

## 2024-10-26 ENCOUNTER — Encounter (HOSPITAL_COMMUNITY): Payer: Self-pay | Admitting: Student in an Organized Health Care Education/Training Program

## 2024-10-26 MED FILL — Midazolam HCl Inj 2 MG/2ML (Base Equivalent): INTRAMUSCULAR | Qty: 2 | Status: AC

## 2024-10-27 ENCOUNTER — Encounter (HOSPITAL_COMMUNITY): Payer: Self-pay | Admitting: Infectious Diseases

## 2024-10-28 ENCOUNTER — Telehealth: Payer: Self-pay

## 2024-10-28 NOTE — Telephone Encounter (Signed)
 Follow-up after same day discharge: Implant date: 10/23/2024 MD: MCA Device: ICD  Location: L chest    Wound check visit: 11/05/2024 90 day MD follow-up: 01/25/2025  Remote Transmission received:yes  Dressing/sling removed:   Confirm OAC restart on: yes  Please continue to monitor your cardiac device site for redness, swelling, and drainage. Call the device clinic at [phone] if you experience these symptoms, fever/chills, or have questions about your device.   Remote monitoring is used to monitor your cardiac device from home. This monitoring is scheduled every 91 days by our office. It allows us  to keep an eye on the functioning of your device to ensure it is working properly.

## 2024-10-29 ENCOUNTER — Telehealth (HOSPITAL_COMMUNITY): Payer: Self-pay

## 2024-10-29 NOTE — Telephone Encounter (Signed)
 Called to confirm/remind patient of their appointment at the [facility] on 10/30/24 3:30.   Appointment:   [x] Confirmed  [] Left mess   [] No answer/No voice mail  [] VM Full/unable to leave message  [] Phone not in service  Patient reminded to bring all medications and/or complete list.  Confirmed patient has transportation. Gave directions, instructed to utilize valet parking.

## 2024-10-30 ENCOUNTER — Other Ambulatory Visit (HOSPITAL_COMMUNITY): Payer: Self-pay | Admitting: Cardiology

## 2024-10-30 ENCOUNTER — Ambulatory Visit (HOSPITAL_COMMUNITY)
Admit: 2024-10-30 | Discharge: 2024-10-30 | Disposition: A | Discharge status: Home / Self Care | Attending: Cardiology | Admitting: Cardiology

## 2024-10-30 ENCOUNTER — Encounter (HOSPITAL_COMMUNITY): Payer: Self-pay

## 2024-10-30 VITALS — BP 146/90 | HR 61 | Ht 71.0 in | Wt 224.8 lb

## 2024-10-30 DIAGNOSIS — Z923 Personal history of irradiation: Secondary | ICD-10-CM | POA: Insufficient documentation

## 2024-10-30 DIAGNOSIS — I428 Other cardiomyopathies: Secondary | ICD-10-CM | POA: Insufficient documentation

## 2024-10-30 DIAGNOSIS — R9431 Abnormal electrocardiogram [ECG] [EKG]: Secondary | ICD-10-CM | POA: Diagnosis not present

## 2024-10-30 DIAGNOSIS — Z79890 Hormone replacement therapy (postmenopausal): Secondary | ICD-10-CM | POA: Insufficient documentation

## 2024-10-30 DIAGNOSIS — G4733 Obstructive sleep apnea (adult) (pediatric): Secondary | ICD-10-CM | POA: Diagnosis not present

## 2024-10-30 DIAGNOSIS — I2510 Atherosclerotic heart disease of native coronary artery without angina pectoris: Secondary | ICD-10-CM | POA: Diagnosis not present

## 2024-10-30 DIAGNOSIS — I5032 Chronic diastolic (congestive) heart failure: Secondary | ICD-10-CM | POA: Diagnosis present

## 2024-10-30 DIAGNOSIS — Z79899 Other long term (current) drug therapy: Secondary | ICD-10-CM | POA: Insufficient documentation

## 2024-10-30 DIAGNOSIS — I493 Ventricular premature depolarization: Secondary | ICD-10-CM

## 2024-10-30 DIAGNOSIS — K219 Gastro-esophageal reflux disease without esophagitis: Secondary | ICD-10-CM | POA: Diagnosis not present

## 2024-10-30 DIAGNOSIS — R0601 Orthopnea: Secondary | ICD-10-CM | POA: Diagnosis not present

## 2024-10-30 DIAGNOSIS — Z4502 Encounter for adjustment and management of automatic implantable cardiac defibrillator: Secondary | ICD-10-CM | POA: Insufficient documentation

## 2024-10-30 DIAGNOSIS — Z85850 Personal history of malignant neoplasm of thyroid: Secondary | ICD-10-CM | POA: Insufficient documentation

## 2024-10-30 DIAGNOSIS — I5020 Unspecified systolic (congestive) heart failure: Secondary | ICD-10-CM

## 2024-10-30 DIAGNOSIS — I472 Ventricular tachycardia, unspecified: Secondary | ICD-10-CM

## 2024-10-30 DIAGNOSIS — E890 Postprocedural hypothyroidism: Secondary | ICD-10-CM | POA: Insufficient documentation

## 2024-10-30 DIAGNOSIS — G4730 Sleep apnea, unspecified: Secondary | ICD-10-CM

## 2024-10-30 LAB — BASIC METABOLIC PANEL WITH GFR
Anion gap: 12 (ref 5–15)
BUN: 18 mg/dL (ref 6–20)
CO2: 21 mmol/L — ABNORMAL LOW (ref 22–32)
Calcium: 9.6 mg/dL (ref 8.9–10.3)
Chloride: 102 mmol/L (ref 98–111)
Creatinine, Ser: 1.26 mg/dL — ABNORMAL HIGH (ref 0.61–1.24)
GFR, Estimated: >60 mL/min (ref >60)
Glucose, Bld: 93 mg/dL (ref 70–99)
Potassium: 4.4 mmol/L (ref 3.5–5.1)
Sodium: 135 mmol/L (ref 135–145)

## 2024-10-30 LAB — BRAIN NATRIURETIC PEPTIDE: B Natriuretic Peptide: 8.9 pg/mL (ref 0.0–100.0)

## 2024-10-30 MED ORDER — SACUBITRIL-VALSARTAN 49-51 MG PO TABS: 1.0000 | ORAL_TABLET | Freq: Two times a day (BID) | ORAL | 11 refills | Status: AC

## 2024-10-30 MED ORDER — POTASSIUM CHLORIDE CRYS ER 20 MEQ PO TBCR: 20.0000 meq | EXTENDED_RELEASE_TABLET | ORAL | Status: AC | PRN

## 2024-10-30 MED ORDER — FUROSEMIDE 40 MG PO TABS: 40.0000 mg | ORAL_TABLET | Freq: Every day | ORAL | Status: AC | PRN

## 2024-10-30 NOTE — Progress Notes (Signed)
 ADVANCED HF CLINIC NOTE  Primary Care: [facility] Primary Cardiologist: Dr. Christi HF Cardiologist:  Dr. Zenaida  HPI: Elijah Perez. is a 58 y.o. 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 Treated with IV amiodarone with return to NSR. Echo showed EF 45-50%, cath showed normal coronaries and LVEDP, CMR showed LVEF 40% with nonspeicific LGE at RV insertion>increase pulm pressures. No infiltrative disease. S/P L sided St. Jude ICD 10/23/24.   He returns today for heart failure follow up with wife. Overall feeling well. NYHA II. Reports fatigue, orthopnea, and pain at incision. Working on getting CPAP machine for OSA. Denies dizziness. Able to perform ADLs. Appetite okay. Weight at home stable 218 lbs, weighs daily. Compliant with all medications.  Current Outpatient Medications  Medication Sig Dispense Refill   allopurinol  (ZYLOPRIM ) 100 MG tablet Take 100 mg by mouth daily.     amiodarone (PACERONE) 200 MG tablet Take 2 tablets (400mg  total) twice a day for 14 days, then 1 tablet (200mg  daily) thereafter 150 tablet 0   empagliflozin  (JARDIANCE ) 10 MG TABS tablet Take 1 tablet (10 mg total) by mouth daily before breakfast. 30 tablet 11   ketotifen (ZADITOR) 0.035 % ophthalmic solution Place 2 drops into both eyes daily.     levothyroxine  (SYNTHROID ) 200 MCG tablet Take 200 mcg by mouth daily before breakfast.     metoprolol  succinate (TOPROL  XL) 50 MG 24 hr tablet Take 1 tablet (50 mg total) by mouth at bedtime. 90 tablet 2   omeprazole (PRILOSEC) 20 MG capsule Take 20 mg by mouth daily.     sacubitril -valsartan  (ENTRESTO ) 24-26 MG Take 1 tablet by mouth 2 (two) times daily. 60 tablet 3    spironolactone  (ALDACTONE ) 25 MG tablet Take 1 tablet (25 mg total) by mouth daily. 30 tablet 11   ferrous sulfate 325 (65 FE) MG EC tablet Take 325 mg by mouth every Monday, Wednesday, and Friday. (Patient not taking: Reported on 10/30/2024)     No current facility-administered medications for this encounter.   Blood pressure (!) 146/90, pulse 61, height 5' 11 (1.803 m), weight 102 kg (224 lb 12.8 oz), SpO2 98%.  Filed Weights   10/30/24 1537  Weight: 102 kg (224 lb 12.8 oz)   PHYSICAL EXAM:  General: Well appearing. No distress  Cardiac: JVP flat. No murmurs  Resp: Lung sounds clear and equal B/L Extremities: Warm and dry.  no edema.  Neuro: A&O x3. Affect pleasant.   ECG (personally reviewed): NSR  Device Interrogation (personally reviewed): <1% VP, AP 56%, 0 VT, 0 AF/AT  DATA REVIEW  ECHO: 1/24: LVEF 30 to 35% with global hypokinesis.  Normal RV function.  Severely dilated left atrium. 4/24: LVEF 35% with global hypokinesis 7/24: LVEF 40-45% 5/25: [facility] echo: LVEF 55%.  11/25: EF 45-50%  CATH: 1/24: no CAD; RA 1, RV 22/0/2, PA 23/5 mean 12, PCWP 7, LV 102/3/9, AO 108/71, CO 4.97 L/min, CI 2.22  CMR:  5/24: LVEF 35%, RVEF 50%, RV insertion LGE 11/25: LVEF 40%, RVEF 50%, RV insertion LGE  ZIO: 3/24: avg HR 75, PVCs 6% 7/24: avg HR 70, 166 NSVT (longest 20b), PVCs 8.2% 6/25: avg HR 73, intermittent BBB, 1 NSVT,  PVCs 1.1%  ASSESSMENT & PLAN:  Chronic HFimpEF - NICM. Cath in 1/24 and 11/25 w/o CAD.  - Echo 11/25 w/ EF 45-50% - Cath 11/25 showed normal coronary anatomy, mild LV dysfunction, EF 45%, and normal LVEDP.   - CMR 11/25: Mild systolic dysfunction LVEF 40% with non-specific late gadolinium enhancement at the RV insertion points consistent with increased pulmonary pressures. No infiltrative process or infarct.  - s/p St. Jude ICD 10/23/24 - NYHA II. Euvolemic - has PRN lasix  40 mg with KCL 20 - GDMT:  ARB/ARNi: increase entresto  49/51 mg bid  ? blocker:  continue toprol  XL 50 mg at bedtime MRA: continue spirolactone 25 mg daily SGLT2i: continue jardiance  10 mg daily - titrate GDMT  2. VT - s/p ICD 11/25 - device interrogation w/o VT - continue amio  3. Thyroid Cancer S/P thyroidectomy  - Levothyroxine  200mcg daily.  - S/P radiation and radioactive iodone.  - No chemotherapy.   4. Sleep Apnea - mild sleep apnea - was fitted for mask, waiting for machine.  5. Frequent PVC - zio with PVC burden of 6% - zio with close to 10% PVC burden - zio with PVC burden 1.1% - continue amio 100 mg daily  Follow up in 3 month with Dr. Stoner  Kourosh Jablonsky, NP 10/30/24

## 2024-10-30 NOTE — Patient Instructions (Signed)
 CHANGE  Entresto  to 49/51 mg Twice daily  Labs done today, your results will be available in MyChart, we will contact you for abnormal readings.  Your physician has requested that you have an echocardiogram. Echocardiography is a painless test that uses sound waves to create images of your heart. It provides your doctor with information about the size and shape of your heart and how well your heart's chambers and valves are working. This procedure takes approximately one hour. There are no restrictions for this procedure. Please do NOT wear cologne, perfume, aftershave, or lotions (deodorant is allowed). Please arrive 15 minutes prior to your appointment time.  Please note: We ask at that you not bring children with you during ultrasound (echo/ vascular) testing. Due to room size and safety concerns, children are not allowed in the ultrasound rooms during exams. Our front office staff cannot provide observation of children in our lobby area while testing is being conducted. An adult accompanying a patient to their appointment will only be allowed in the ultrasound room at the discretion of the ultrasound technician under special circumstances. We apologize for any inconvenience.  Your physician recommends that you schedule a follow-up appointment in: 3 months with an echocardiogram ( February 2026) ** PLEASE CALL THE OFFICE IN [place] TO ARRANGE YOUR FOLLOW UP APPOINTMENT.**  If you have any questions or concerns before your next appointment please send us  a message through [place] or call our office at [phone].    TO LEAVE A MESSAGE FOR THE NURSE SELECT OPTION 2, PLEASE LEAVE A MESSAGE INCLUDING: YOUR NAME DATE OF BIRTH CALL BACK NUMBER REASON FOR CALL**this is important as we prioritize the call backs  YOU WILL RECEIVE A CALL BACK THE SAME DAY AS LONG AS YOU CALL BEFORE 4:00 PM  At the [facility], you and your health needs are our priority. As part of our continuing  mission to provide you with exceptional heart care, we have created designated Provider Care Teams. These Care Teams include your primary Cardiologist (physician) and Advanced Practice Providers (APPs- Physician Assistants and Nurse Practitioners) who all work together to provide you with the care you need, when you need it.   You may see any of the following providers on your designated Care Team at your next follow up: Dr Toribio Fuel Dr Ezra Shuck Dr. Morene Brownie Greig Mosses, NP Caffie Shed, [place] [facility], [place] Beckey Coe, NP Jordan Lee, NP Ellouise Class, NP Tinnie Redman, PharmD Jaun Bash, PharmD   Please be sure to bring in all your medications bottles to every appointment.    Thank you for choosing [facility]

## 2024-11-02 ENCOUNTER — Ambulatory Visit (HOSPITAL_COMMUNITY): Payer: Self-pay | Admitting: Cardiology

## 2024-11-04 ENCOUNTER — Telehealth (HOSPITAL_COMMUNITY): Payer: Self-pay

## 2024-11-04 NOTE — Telephone Encounter (Signed)
 Called patient regarding cardiac rehab referral from the heart failure clinic. Left VM informing him he needed to contact his community care contact person at the [place] and request a referral for CR be sent to [facility].

## 2024-11-05 ENCOUNTER — Ambulatory Visit: Attending: Cardiovascular Disease

## 2024-11-05 DIAGNOSIS — I472 Ventricular tachycardia, unspecified: Secondary | ICD-10-CM | POA: Diagnosis not present

## 2024-11-05 LAB — CUP PACEART INCLINIC DEVICE CHECK
Battery Remaining Longevity: 106 mo
Brady Statistic RA Percent Paced: 59 %
Brady Statistic RV Percent Paced: 0.46 %
Date Time Interrogation Session: 20251120123439
HighPow Impedance: 61.875
Implantable Lead Connection Status: 753985
Implantable Lead Connection Status: 753985
Implantable Lead Implant Date: 20251107
Implantable Lead Implant Date: 20251107
Implantable Lead Location: 753859
Implantable Lead Location: 753860
Implantable Pulse Generator Implant Date: 20251107
Lead Channel Impedance Value: 425 Ohm
Lead Channel Impedance Value: 500 Ohm
Lead Channel Pacing Threshold Amplitude: 0.75 V
Lead Channel Pacing Threshold Amplitude: 0.75 V
Lead Channel Pacing Threshold Amplitude: 1.125 V
Lead Channel Pacing Threshold Pulse Width: 0.5 ms
Lead Channel Pacing Threshold Pulse Width: 0.5 ms
Lead Channel Pacing Threshold Pulse Width: 0.5 ms
Lead Channel Sensing Intrinsic Amplitude: 10.8 mV
Lead Channel Sensing Intrinsic Amplitude: 5 mV
Lead Channel Setting Pacing Amplitude: 2.125
Lead Channel Setting Pacing Amplitude: 3.5 V
Lead Channel Setting Pacing Pulse Width: 0.5 ms
Lead Channel Setting Sensing Sensitivity: 0.5 mV
Pulse Gen Serial Number: 810137290
Zone Setting Status: 755011

## 2024-11-05 NOTE — Patient Instructions (Signed)
  After Your ICD (Implantable Cardiac Defibrillator)    Monitor your defibrillator site for redness, swelling, and drainage. Call the device clinic at [phone] if you experience these symptoms or fever/chills.  Your incision was closed with Dermabond:  You may shower 1 day after your defibrillator implant and wash your incision with soap and water . Avoid lotions, ointments, or perfumes over your incision until it is well-healed.  You may use a hot tub or a pool after your wound check appointment if the incision is completely closed.  Do not lift, push or pull greater than 10 pounds with the affected arm until DECEMBER 12th. There are no other restrictions in arm movement after your wound check appointment.  Your ICD is designed to protect you from life threatening heart rhythms. Because of this, you may receive a shock.   1 shock with no symptoms:  Call the office during business hours. 1 shock with symptoms (chest pain, chest pressure, dizziness, lightheadedness, shortness of breath, overall feeling unwell):  Call 911. If you experience 2 or more shocks in 24 hours:  Call 911. If you receive a shock, you should not drive.  [place] DMV - no driving for 6 months if you receive appropriate therapy from your ICD.   ICD Alerts:  Some alerts are vibratory and others beep. These are NOT emergencies. Please call our office to let us  know. If this occurs at night or on weekends, it can wait until the next business day. Send a remote transmission.  If your device is capable of reading fluid status (for heart failure), you will be offered monthly monitoring to review this with you.   Remote monitoring is used to monitor your ICD from home. This monitoring is scheduled every 91 days by our office. It allows us  to keep an eye on the functioning of your device to ensure it is working properly. You will routinely see your Electrophysiologist annually (more often if necessary).

## 2024-11-05 NOTE — Progress Notes (Signed)
 Normal dual chamber ICD wound check. Wound well healed. Presenting rhythm: AP/VS 60. Routine testing performed. Thresholds, sensing, and impedance consistent with implant measurements. No treated arrhythmias. Reviewed arm restrictions to continue for 6 weeks total post op. Reviewed shock plan.  Pt enrolled in remote follow-up.

## 2024-11-14 ENCOUNTER — Encounter: Payer: Self-pay | Admitting: Cardiology

## 2024-11-24 ENCOUNTER — Ambulatory Visit: Payer: Self-pay | Admitting: Student in an Organized Health Care Education/Training Program

## 2024-12-04 ENCOUNTER — Ambulatory Visit

## 2024-12-04 DIAGNOSIS — I472 Ventricular tachycardia, unspecified: Secondary | ICD-10-CM | POA: Diagnosis not present

## 2024-12-05 LAB — CUP PACEART REMOTE DEVICE CHECK
Battery Remaining Longevity: 103 mo
Battery Remaining Percentage: 95 %
Battery Voltage: 3.07 V
Brady Statistic AP VP Percent: 1 %
Brady Statistic AP VS Percent: 48 %
Brady Statistic AS VP Percent: 1 %
Brady Statistic AS VS Percent: 52 %
Brady Statistic RA Percent Paced: 47 %
Brady Statistic RV Percent Paced: 1 %
Date Time Interrogation Session: 20251219020053
HighPow Impedance: 62 Ohm
Implantable Lead Connection Status: 753985
Implantable Lead Connection Status: 753985
Implantable Lead Implant Date: 20251107
Implantable Lead Implant Date: 20251107
Implantable Lead Location: 753859
Implantable Lead Location: 753860
Implantable Pulse Generator Implant Date: 20251107
Lead Channel Impedance Value: 450 Ohm
Lead Channel Impedance Value: 500 Ohm
Lead Channel Pacing Threshold Amplitude: 1 V
Lead Channel Pacing Threshold Amplitude: 1.5 V
Lead Channel Pacing Threshold Pulse Width: 0.4 ms
Lead Channel Pacing Threshold Pulse Width: 0.4 ms
Lead Channel Sensing Intrinsic Amplitude: 10.8 mV
Lead Channel Sensing Intrinsic Amplitude: 5 mV
Lead Channel Setting Pacing Amplitude: 2 V
Lead Channel Setting Pacing Amplitude: 2.5 V
Lead Channel Setting Pacing Pulse Width: 0.4 ms
Lead Channel Setting Sensing Sensitivity: 0.5 mV
Pulse Gen Serial Number: 810137290
Zone Setting Status: 755011

## 2024-12-07 ENCOUNTER — Encounter: Payer: Self-pay | Admitting: Cardiology

## 2024-12-07 NOTE — Progress Notes (Signed)
 Remote ICD Transmission

## 2024-12-11 ENCOUNTER — Ambulatory Visit: Payer: Self-pay | Admitting: Student in an Organized Health Care Education/Training Program

## 2024-12-18 ENCOUNTER — Other Ambulatory Visit (HOSPITAL_COMMUNITY): Payer: Self-pay | Admitting: Infectious Diseases

## 2024-12-18 DIAGNOSIS — Z0189 Encounter for other specified special examinations: Secondary | ICD-10-CM

## 2024-12-19 ENCOUNTER — Other Ambulatory Visit: Payer: Self-pay

## 2024-12-19 ENCOUNTER — Emergency Department (HOSPITAL_COMMUNITY)

## 2024-12-19 ENCOUNTER — Encounter (HOSPITAL_COMMUNITY): Payer: Self-pay | Admitting: *Deleted

## 2024-12-19 ENCOUNTER — Emergency Department (HOSPITAL_COMMUNITY)
Admission: EM | Admit: 2024-12-19 | Discharge: 2024-12-20 | Attending: Emergency Medicine | Admitting: Emergency Medicine

## 2024-12-19 DIAGNOSIS — M545 Low back pain, unspecified: Secondary | ICD-10-CM | POA: Diagnosis not present

## 2024-12-19 DIAGNOSIS — Y92410 Unspecified street and highway as the place of occurrence of the external cause: Secondary | ICD-10-CM | POA: Insufficient documentation

## 2024-12-19 DIAGNOSIS — Z5321 Procedure and treatment not carried out due to patient leaving prior to being seen by health care provider: Secondary | ICD-10-CM | POA: Insufficient documentation

## 2024-12-19 DIAGNOSIS — M542 Cervicalgia: Secondary | ICD-10-CM | POA: Diagnosis present

## 2024-12-19 MED ORDER — ACETAMINOPHEN 500 MG PO TABS
1000.0000 mg | ORAL_TABLET | Freq: Once | ORAL | Status: AC
  Administered 2024-12-19: 1000 mg via ORAL

## 2024-12-19 MED ORDER — ACETAMINOPHEN 500 MG PO TABS
ORAL_TABLET | ORAL | Status: AC
  Filled 2024-12-19: qty 2
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
## 2024-12-19 NOTE — ED Notes (Signed)
 Pt informed this NT of departure.
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
## 2024-12-24 ENCOUNTER — Ambulatory Visit (HOSPITAL_COMMUNITY)
Admission: RE | Admit: 2024-12-24 | Discharge: 2024-12-24 | Disposition: A | Discharge status: Home / Self Care | Source: Ambulatory Visit | Attending: Infectious Diseases | Admitting: Infectious Diseases

## 2024-12-24 DIAGNOSIS — Z0189 Encounter for other specified special examinations: Secondary | ICD-10-CM | POA: Insufficient documentation

## 2025-01-13 ENCOUNTER — Other Ambulatory Visit (HOSPITAL_COMMUNITY): Payer: Self-pay

## 2025-01-25 ENCOUNTER — Ambulatory Visit: Admitting: Pulmonary Disease

## 2025-02-10 ENCOUNTER — Ambulatory Visit: Admitting: Student

## 2025-03-05 ENCOUNTER — Ambulatory Visit

## 2025-06-04 ENCOUNTER — Ambulatory Visit

## 2025-09-03 ENCOUNTER — Ambulatory Visit
